# Patient Record
Sex: Female | Born: 1955 | Race: White | Hispanic: No | Marital: Married | Smoking: Never smoker
Health system: Southern US, Community
[De-identification: ages and names within clinical notes are randomized; demographics above are authoritative.]

## PROBLEM LIST (undated history)

## (undated) DIAGNOSIS — K219 Gastro-esophageal reflux disease without esophagitis: Secondary | ICD-10-CM

## (undated) DIAGNOSIS — Z9889 Other specified postprocedural states: Secondary | ICD-10-CM

## (undated) DIAGNOSIS — I10 Essential (primary) hypertension: Secondary | ICD-10-CM

## (undated) DIAGNOSIS — J45909 Unspecified asthma, uncomplicated: Secondary | ICD-10-CM

## (undated) DIAGNOSIS — R112 Nausea with vomiting, unspecified: Secondary | ICD-10-CM

## (undated) DIAGNOSIS — H269 Unspecified cataract: Secondary | ICD-10-CM

## (undated) DIAGNOSIS — T7840XA Allergy, unspecified, initial encounter: Secondary | ICD-10-CM

## (undated) DIAGNOSIS — G4730 Sleep apnea, unspecified: Secondary | ICD-10-CM

## (undated) DIAGNOSIS — F419 Anxiety disorder, unspecified: Secondary | ICD-10-CM

## (undated) DIAGNOSIS — E119 Type 2 diabetes mellitus without complications: Secondary | ICD-10-CM

## (undated) DIAGNOSIS — F32A Depression, unspecified: Secondary | ICD-10-CM

## (undated) HISTORY — DX: Allergy, unspecified, initial encounter: T78.40XA

## (undated) HISTORY — DX: Sleep apnea, unspecified: G47.30

## (undated) HISTORY — DX: Anxiety disorder, unspecified: F41.9

## (undated) HISTORY — DX: Depression, unspecified: F32.A

## (undated) HISTORY — DX: Unspecified cataract: H26.9

## (undated) HISTORY — DX: Unspecified asthma, uncomplicated: J45.909

## (undated) HISTORY — DX: Gastro-esophageal reflux disease without esophagitis: K21.9

## (undated) HISTORY — DX: Type 2 diabetes mellitus without complications: E11.9

## (undated) HISTORY — PX: JOINT REPLACEMENT: SHX530

## (undated) HISTORY — DX: Essential (primary) hypertension: I10

## (undated) HISTORY — PX: NO PAST SURGERIES: SHX2092

## (undated) HISTORY — PX: BREAST BIOPSY: SHX20

---

## 1983-11-05 HISTORY — PX: AUGMENTATION MAMMAPLASTY: SUR837

## 1998-02-27 ENCOUNTER — Other Ambulatory Visit: Admission: RE | Admit: 1998-02-27 | Discharge: 1998-02-27 | Payer: Self-pay | Admitting: Obstetrics & Gynecology

## 2000-09-06 ENCOUNTER — Encounter: Admission: RE | Admit: 2000-09-06 | Discharge: 2000-09-06 | Payer: Self-pay | Admitting: *Deleted

## 2000-12-29 ENCOUNTER — Other Ambulatory Visit: Admission: RE | Admit: 2000-12-29 | Discharge: 2000-12-29 | Payer: Self-pay | Admitting: Obstetrics & Gynecology

## 2003-03-14 ENCOUNTER — Ambulatory Visit (HOSPITAL_COMMUNITY): Admission: RE | Admit: 2003-03-14 | Discharge: 2003-03-14 | Payer: Self-pay | Admitting: *Deleted

## 2003-03-14 ENCOUNTER — Encounter: Payer: Self-pay | Admitting: *Deleted

## 2004-01-23 ENCOUNTER — Ambulatory Visit (HOSPITAL_COMMUNITY): Admission: RE | Admit: 2004-01-23 | Discharge: 2004-01-23 | Payer: Self-pay | Admitting: Family Medicine

## 2004-01-23 IMAGING — CT CT ABDOMEN W/ CM
1 of 2 series · 14 of 32 positions shown, 19 images · IV contrast (omnipaque)
Comparison: none

CLINICAL DATA: Abdominal pain.
TECHNIQUE: Multi detector helical CT imaging of the abdomen and pelvis performed following dilute oral contrast and 100 cc Omnipaque 300.  Non-ionic contrast indicated due to history of asthma.  No prior exams for comparison.
 CT ABDOMEN WITH CONTRAST
 Bilateral breast prostheses with capsular calcification.  Lung bases clear.  Small nonspecific low attenuation focus, liver, medially in the inferior aspect of the right lobe, 7 x 9 mm in size (image #39), too small to characterize.  This persists on delayed images.  Potentially, this could represent a small cyst.  Confirmation by ultrasound recommended.  Remainder of liver, spleen, pancreas, kidneys and adrenal glands unremarkable.  No mass, adenopathy, free fluid or inflammatory process seen.  Oral contrast has progressed to distal small bowel and colon.  
 IMPRESSION
 Tiny nonspecific low attenuation focus liver, 9 x 7 mm in size.  Potentially this could represent a small cyst and confirmation by ultrasound recommended.
 CT PELVIS WITH CONTRAST
 Tiny amount of free pelvic fluid in cul-de-sac.  Central low attenuation in uterus may be related to phase of menses.  Low attenuation focus right adnexa, probably representing an ovarian cyst, 2.8 x 3.2 cm in size (image #87).  This, too, can be confirmed by ultrasound.  Normal appendix.  No additional mass, adenopathy or hernia.  Bowel loops otherwise unremarkable.  
 Tiny amount of free pelvic fluid.  Potential right ovarian cyst, recommend confirmation by ultrasound.

[Series 8542: — · axial · 0.77mm/px · z∈[+1084,+1550]mm · 14 of 103 slices shown, 19 images]
[im 5/103  soft-tissue]
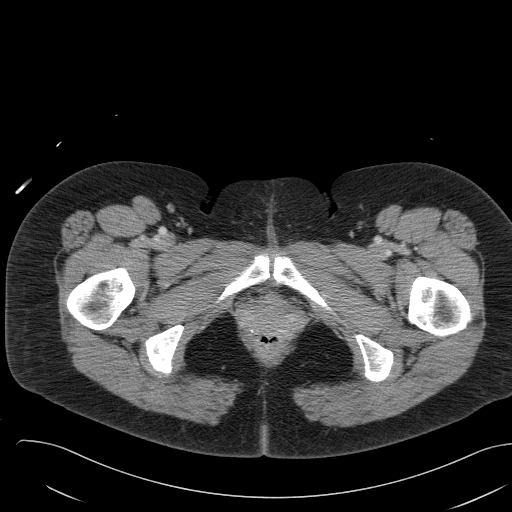
[im 5/103  bone]
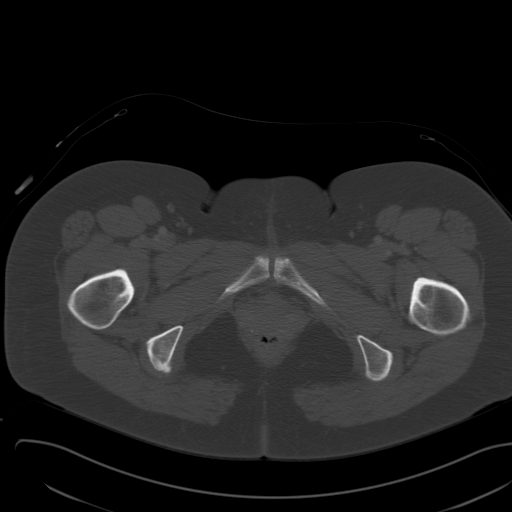
[im 15/103  soft-tissue]
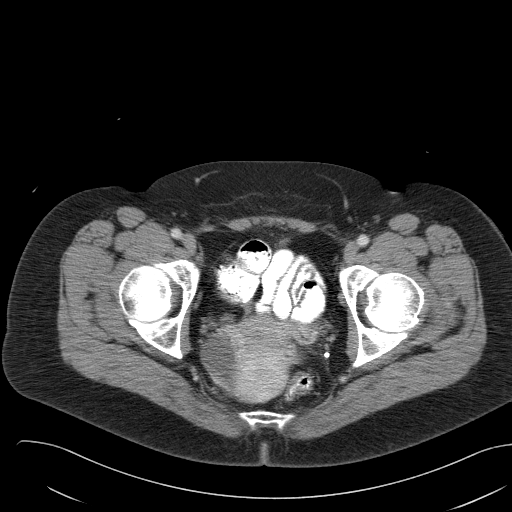
[im 20/103  soft-tissue]
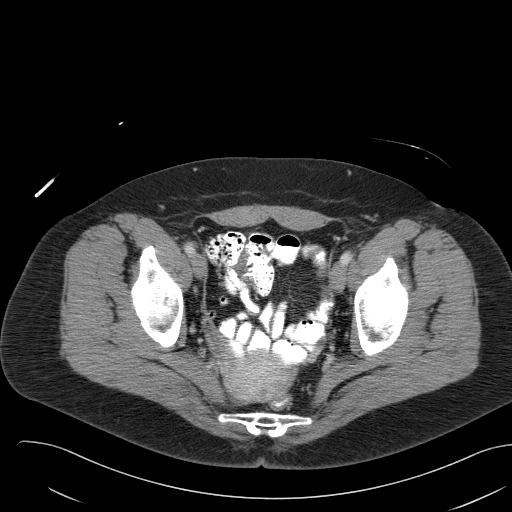
[im 30/103  soft-tissue]
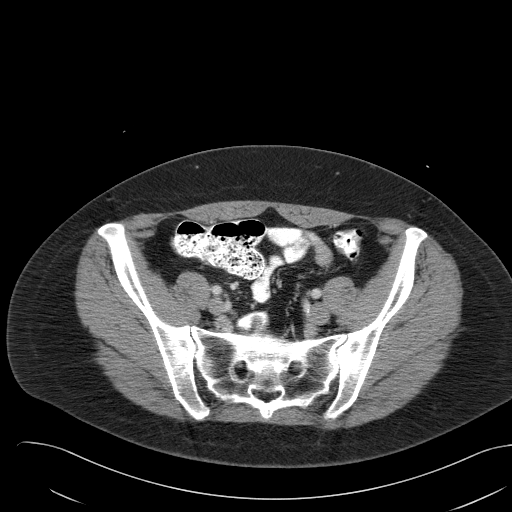
[im 35/103  soft-tissue]
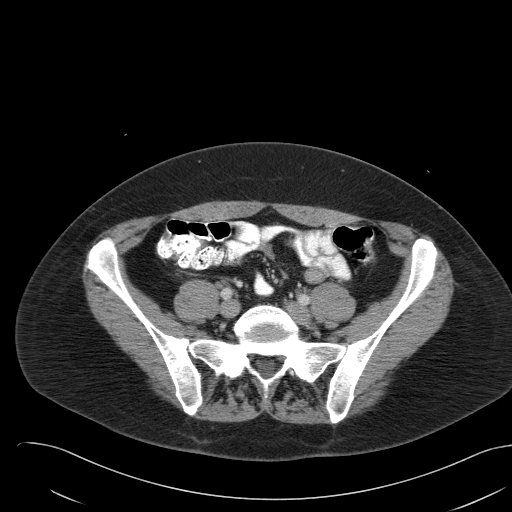
[im 44/103  soft-tissue]
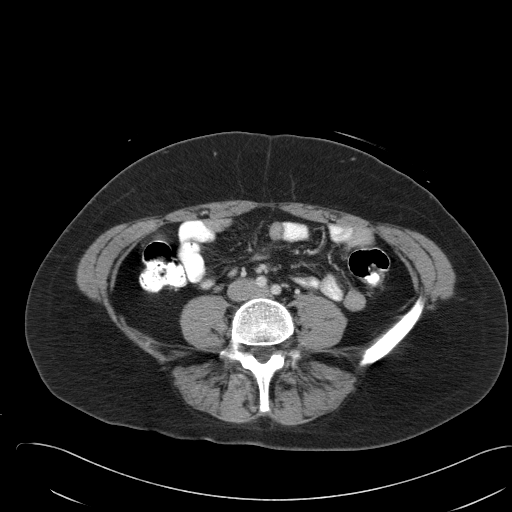
[im 54/103  soft-tissue]
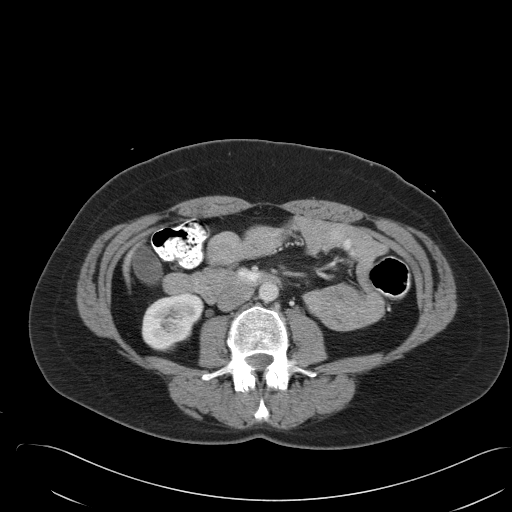
[im 59/103  soft-tissue]
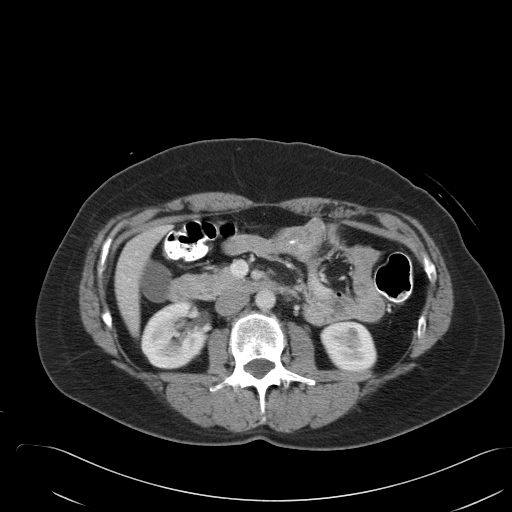
[im 69/103  soft-tissue]
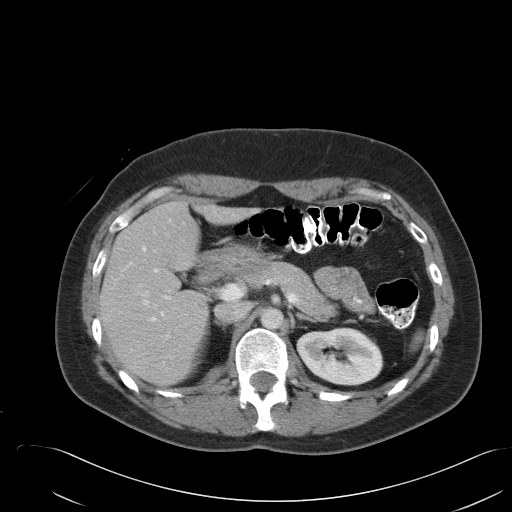
[im 69/103  bone]
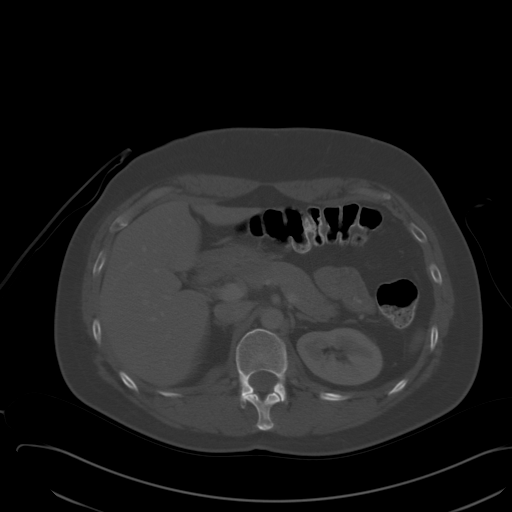
[im 73/103  soft-tissue]
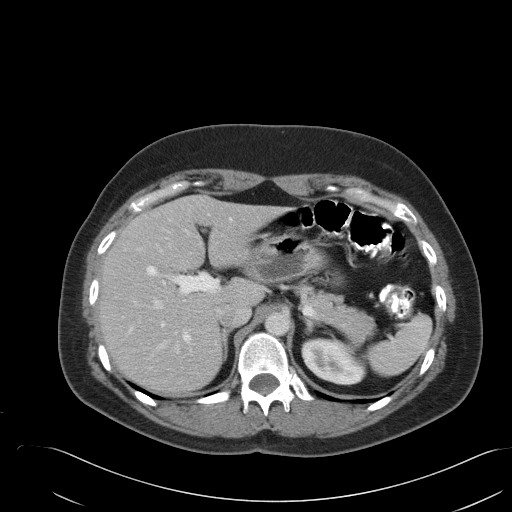
[im 83/103  soft-tissue]
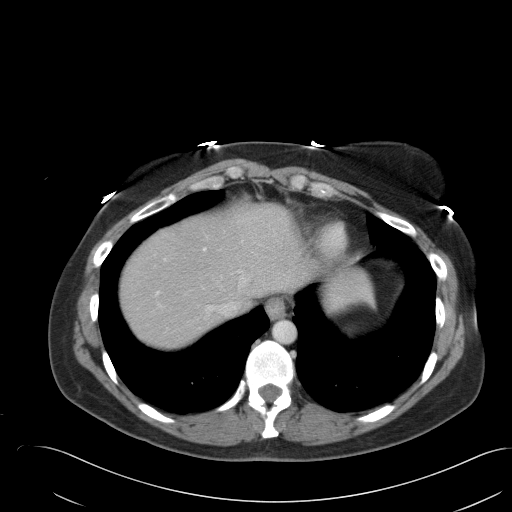
[im 83/103  lung]
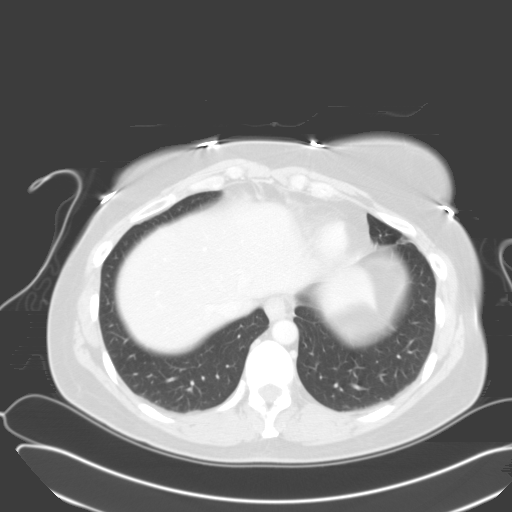
[im 88/103  soft-tissue]
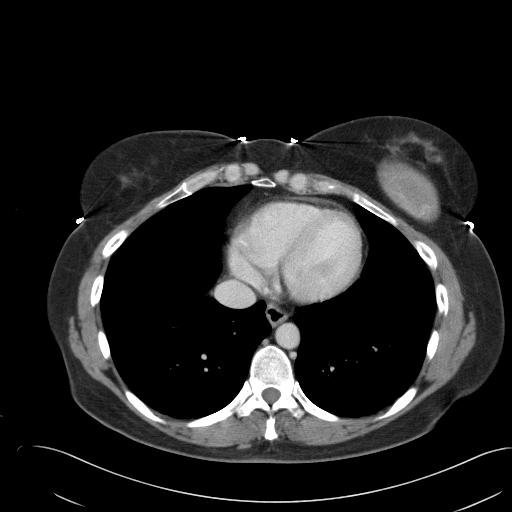
[im 88/103  lung]
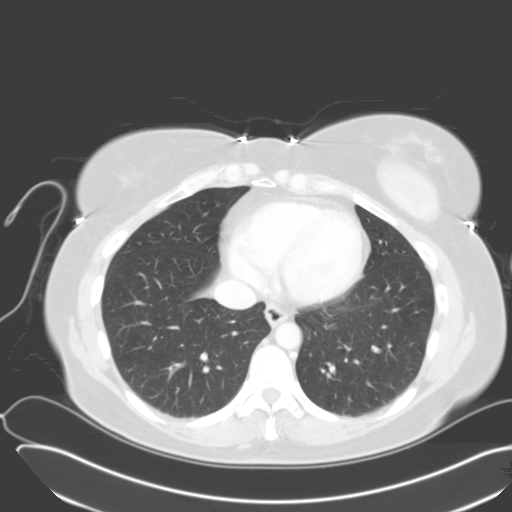
[im 93/103  lung]
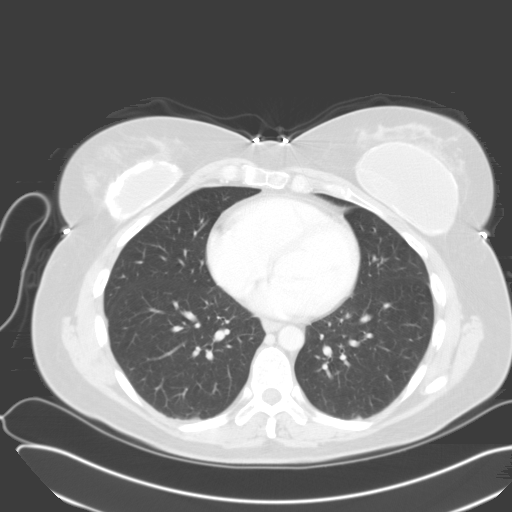
[im 98/103  soft-tissue]
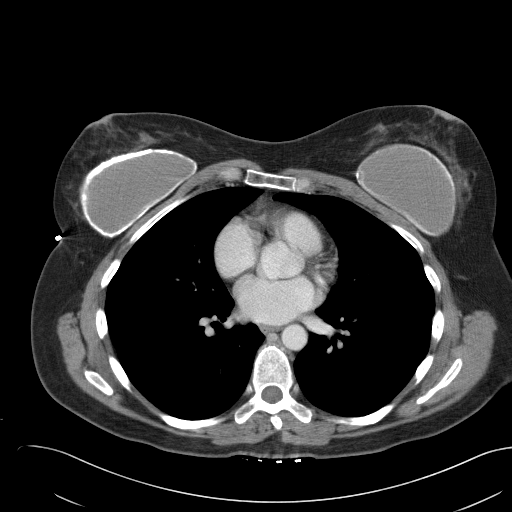
[im 98/103  lung]
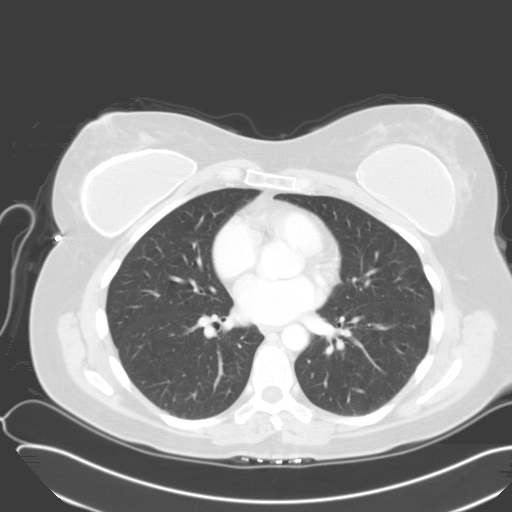

[14 of 32 positions shown; findings below may reference images not displayed]

## 2004-02-22 ENCOUNTER — Ambulatory Visit (HOSPITAL_COMMUNITY): Admission: RE | Admit: 2004-02-22 | Discharge: 2004-02-22 | Payer: Self-pay | Admitting: Family Medicine

## 2004-02-22 IMAGING — CR DG FOOT COMPLETE 3+V*L*
2 series · 2 of 2 positions shown · non-contrast
Comparison: none

CLINICAL DATA: Left ankle and foot pain.
COMPLETE LEFT FOOT
Three views of the left foot demonstrate normal appearing bones and soft tissues. 
IMPRESSION
Normal examination. 
COMPLETE LEFT ANKLE
Three views of the left ankle demonstrate normal appearing bones and soft tissues. 
Normal examination.

[view not recorded (1 of 2)]
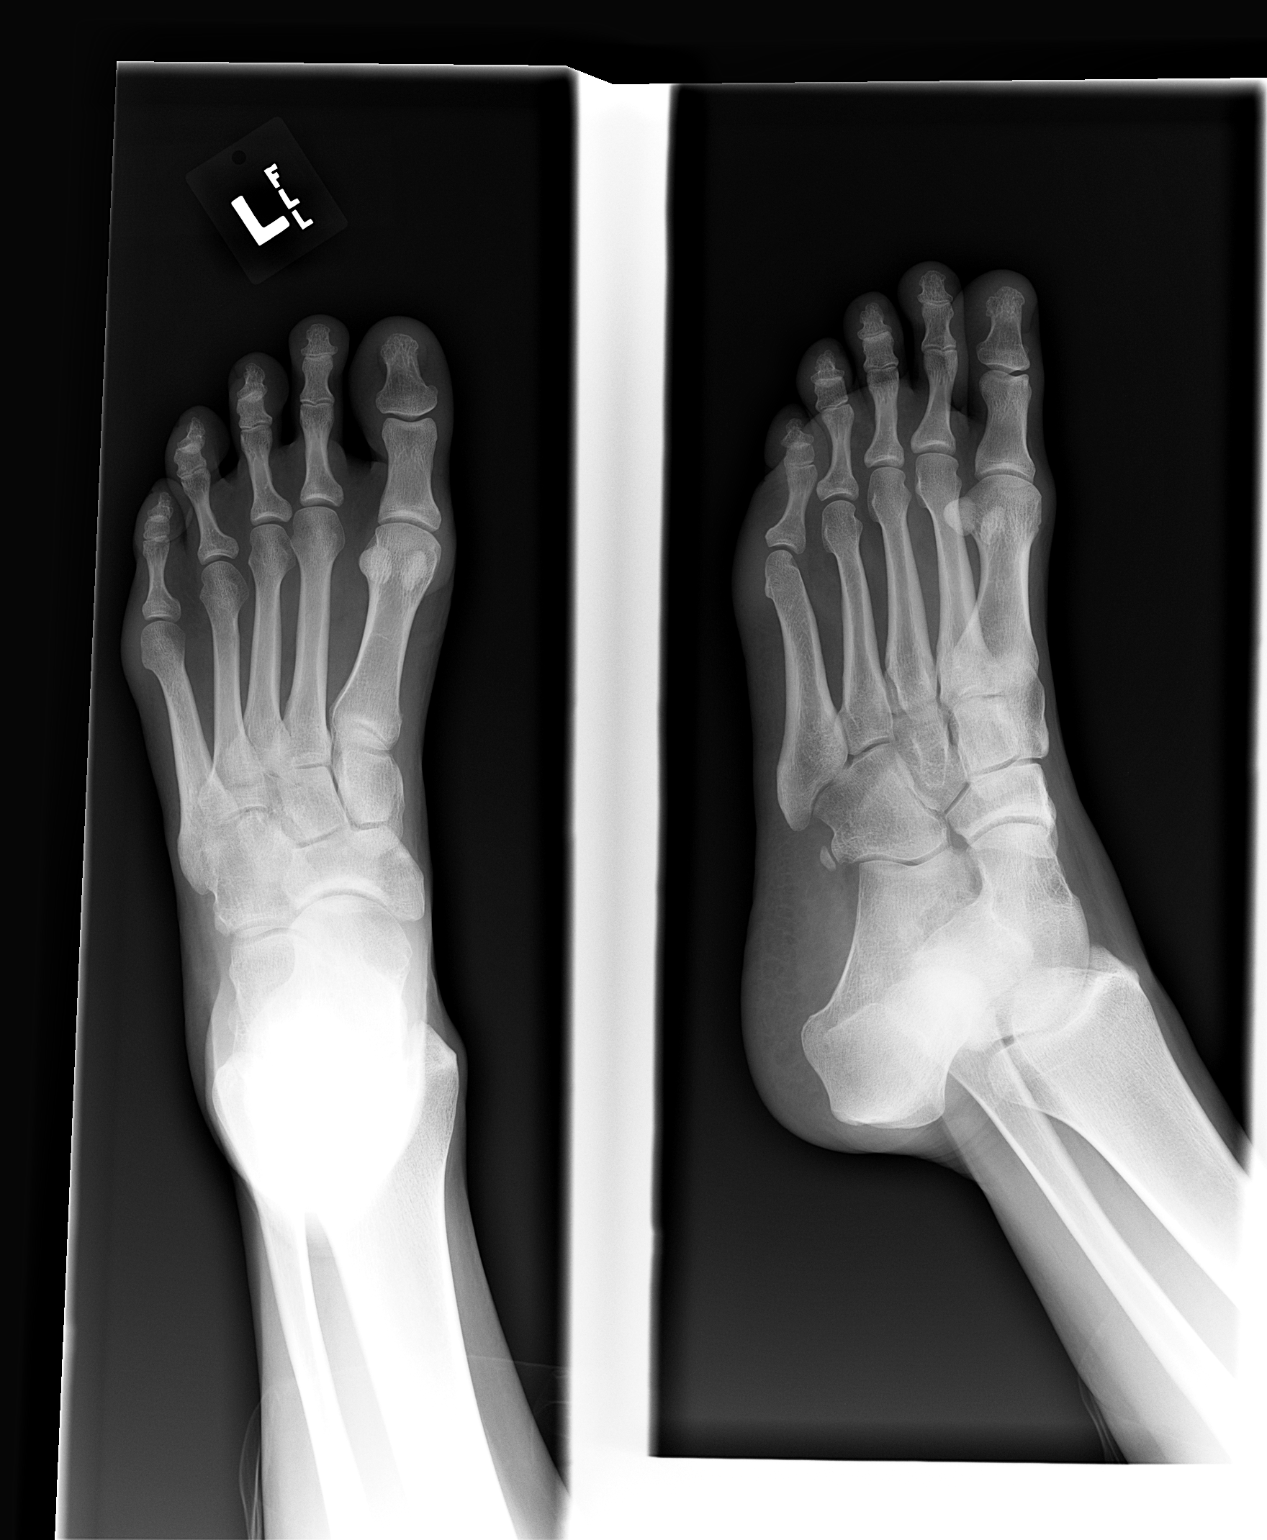

[view not recorded (2 of 2)]
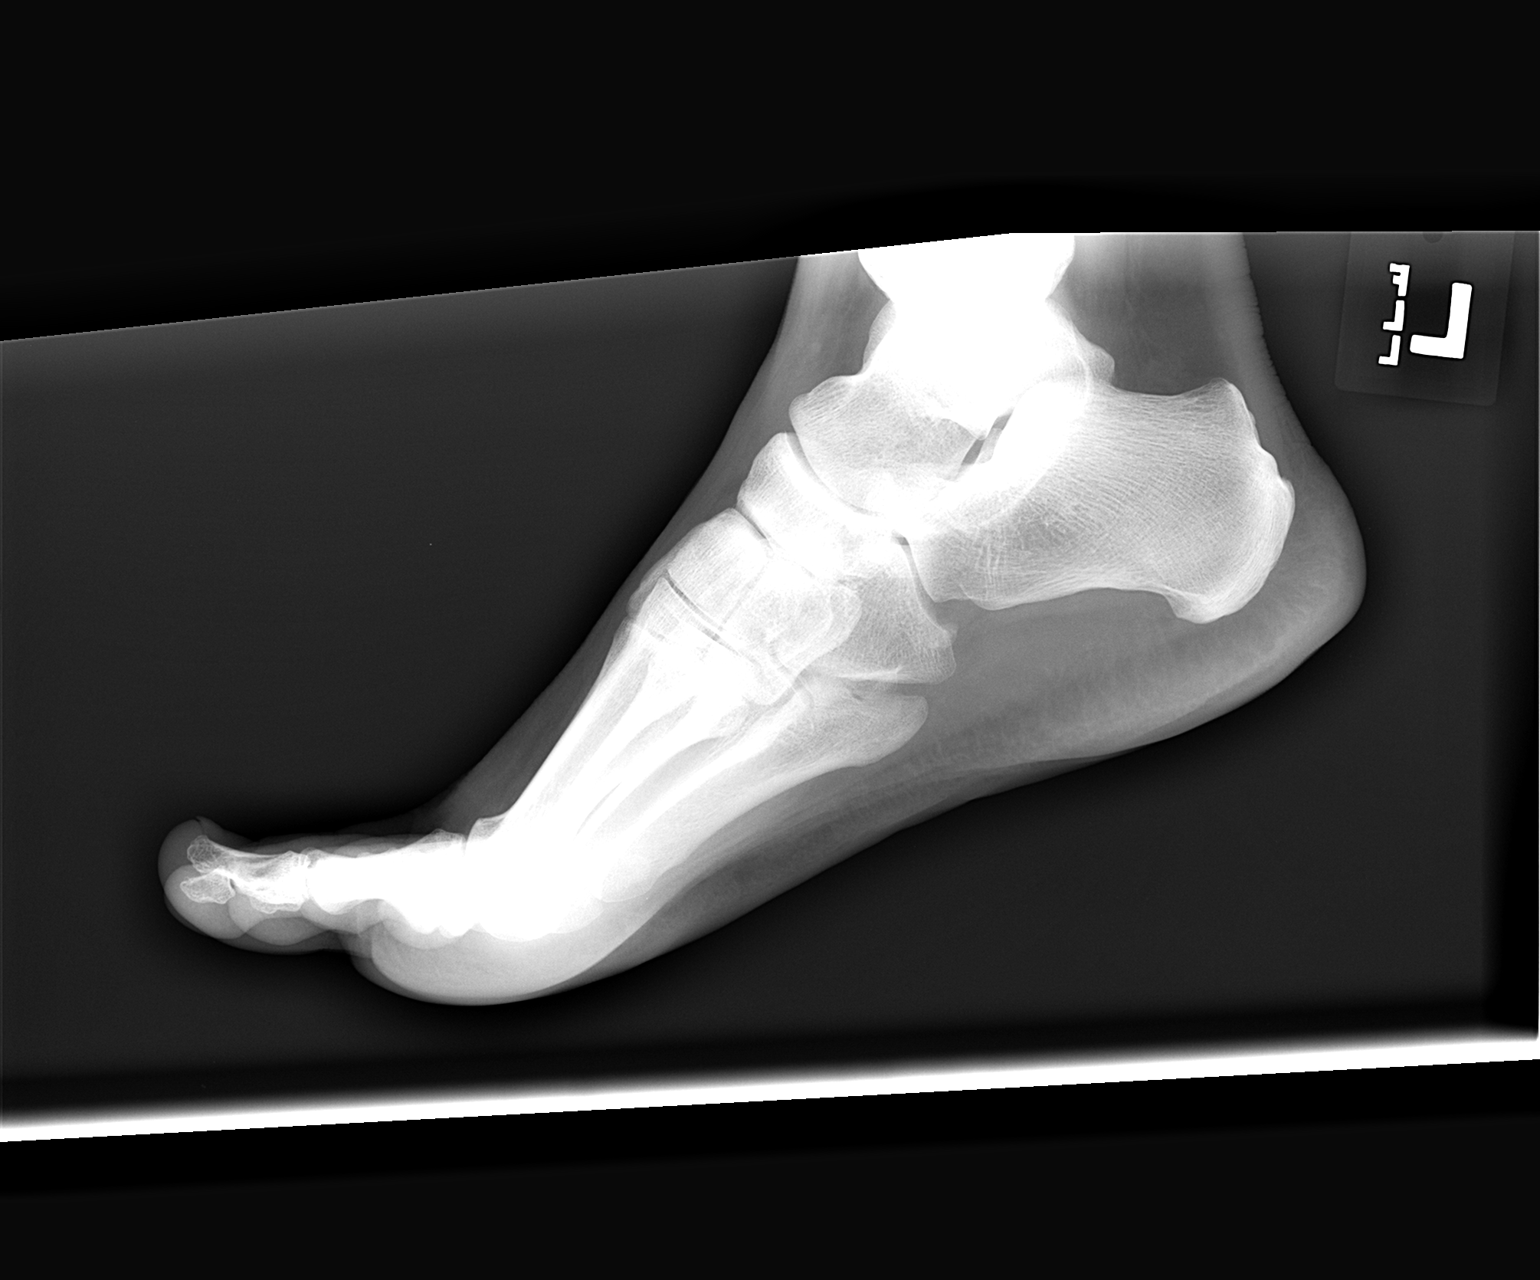

[2 of 2 positions shown; findings below may reference images not displayed]

## 2004-02-22 IMAGING — US US ABDOMEN LIMITED
1 series · 14 of 25 positions shown · non-contrast
Comparison: none

CLINICAL DATA: The patient had an abnormal CT scan with a lesion in the liver.  
 LIMITED ABDOMINAL ULTRASOUND 
 Multiple scans of the liver are made and show a small 6 x 6 x 9-mm cyst associated with the right lobe of the liver.

[Series 1: unknown · 0.34mm/px · 14 of 28 slices shown]
[im 1/28]
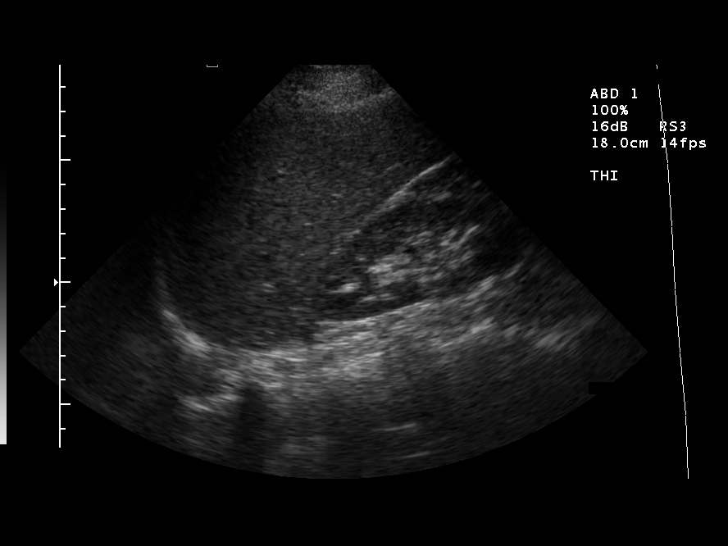
[im 3/28]
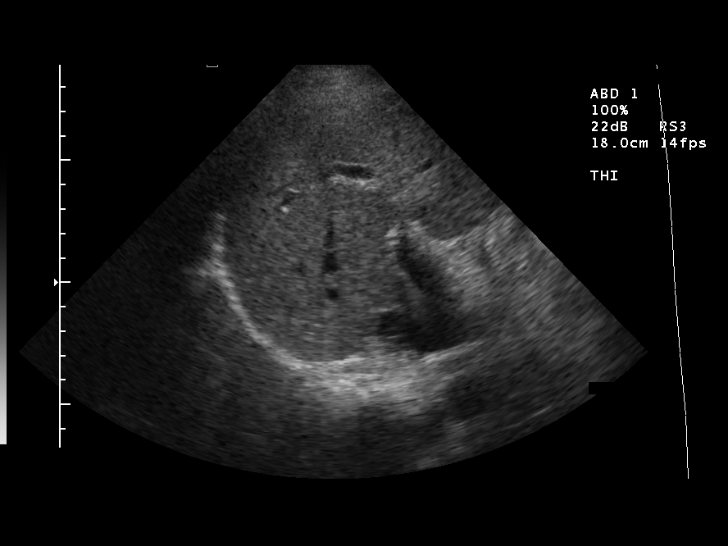
[im 5/28]
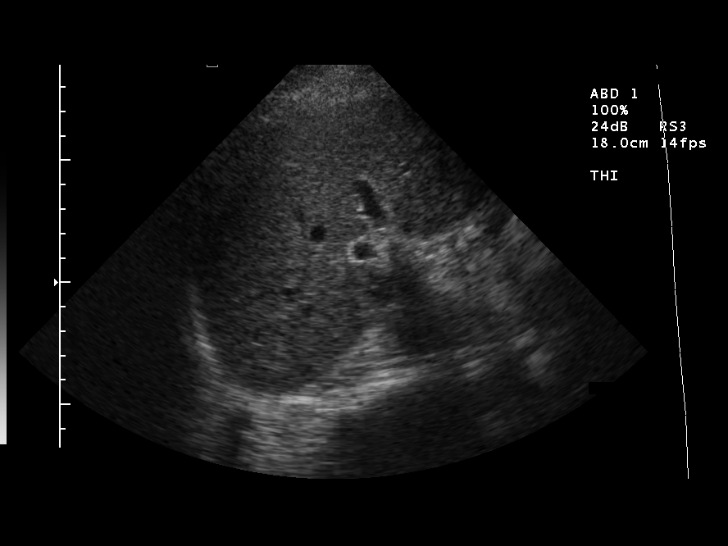
[im 7/28]
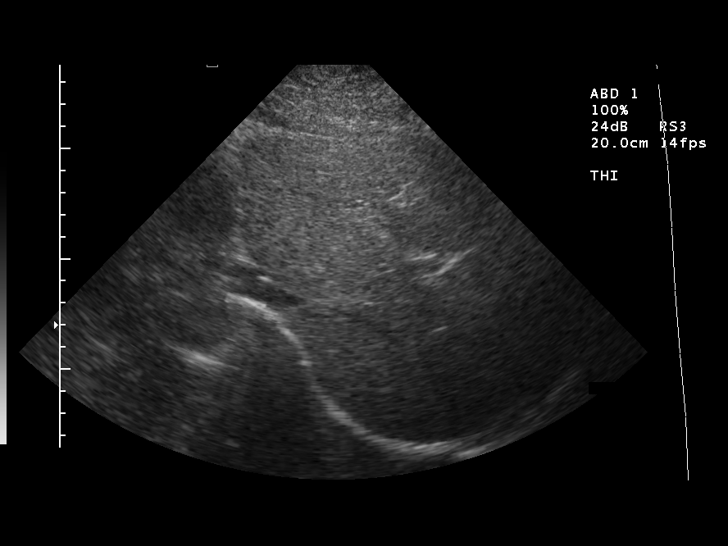
[im 10/28]
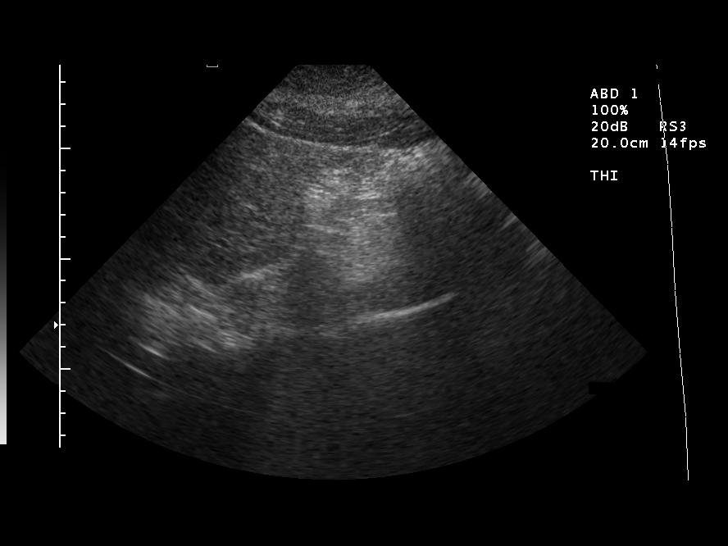
[im 11/28]
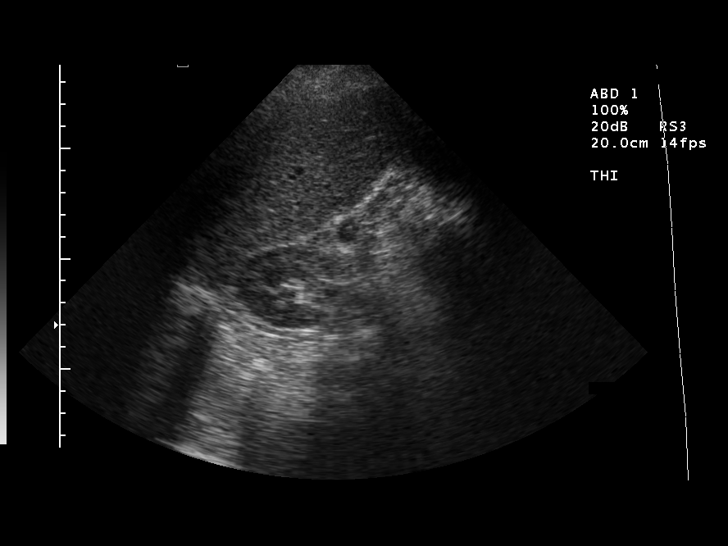
[im 13/28]
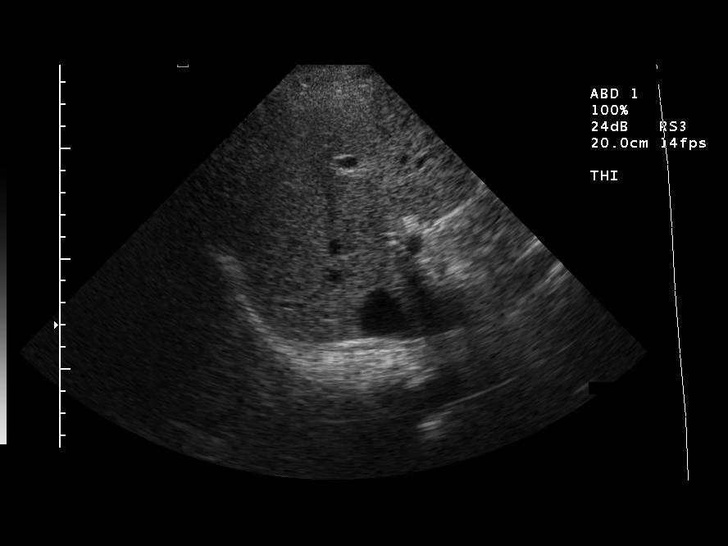
[im 15/28]
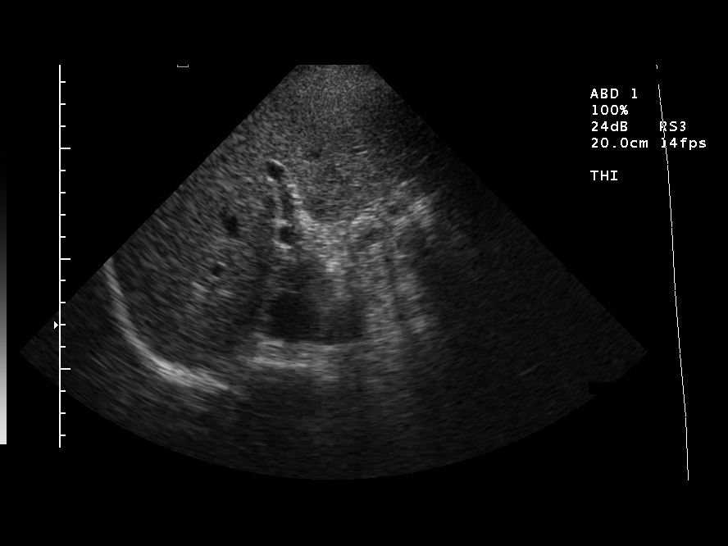
[im 17/28]
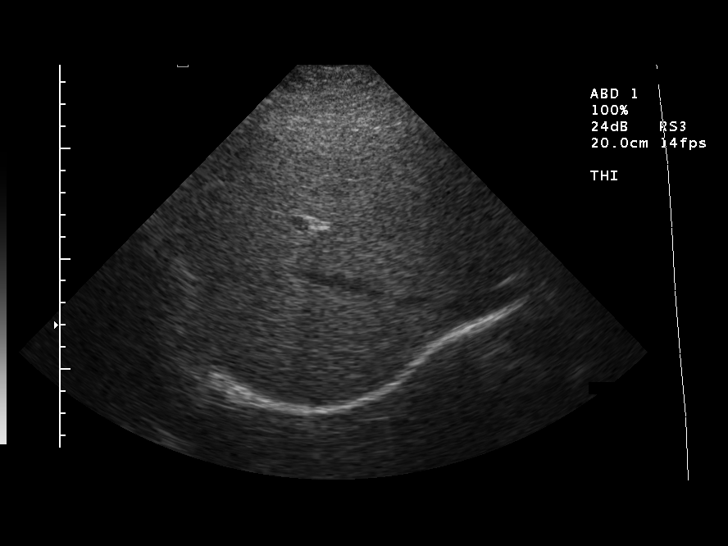
[im 19/28]
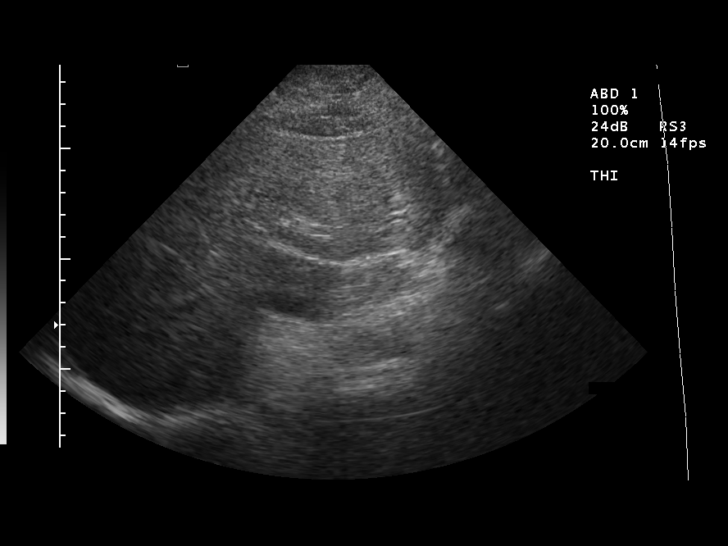
[im 21/28]
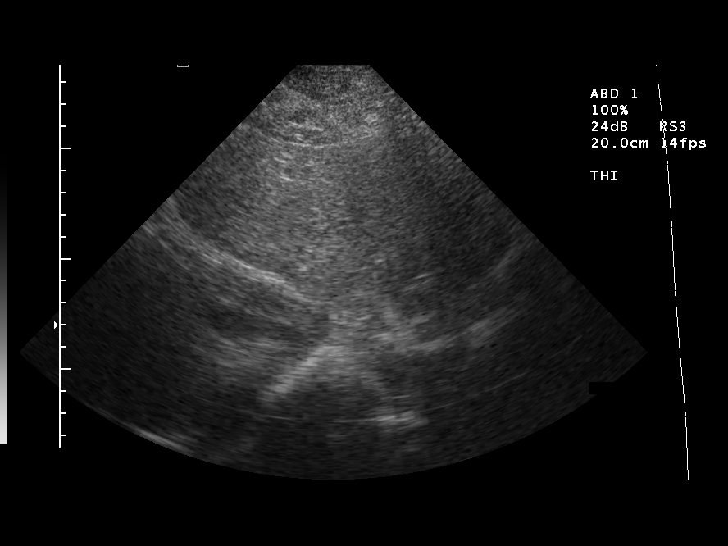
[im 23/28]
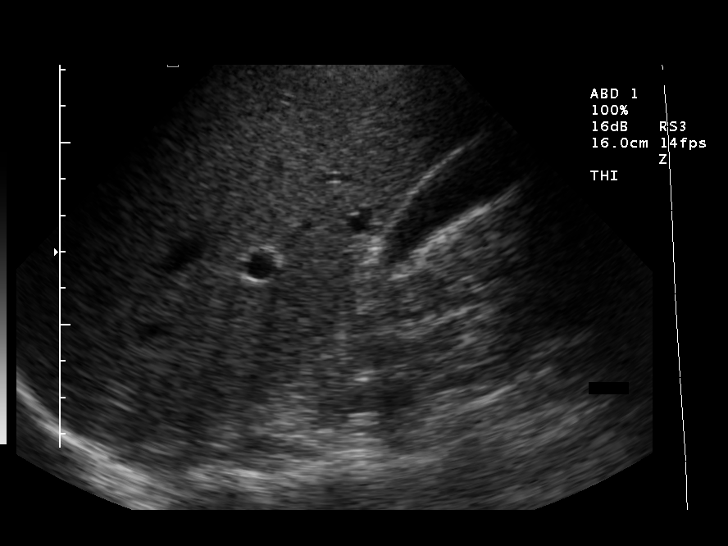
[im 25/28]
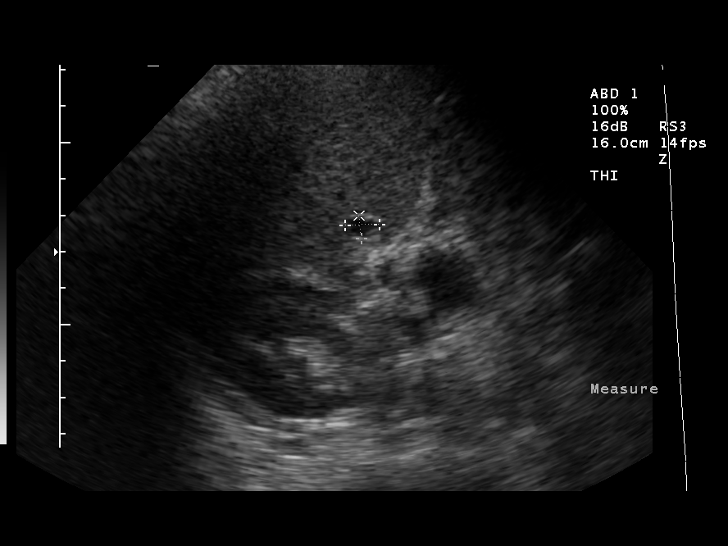
[im 28/28]
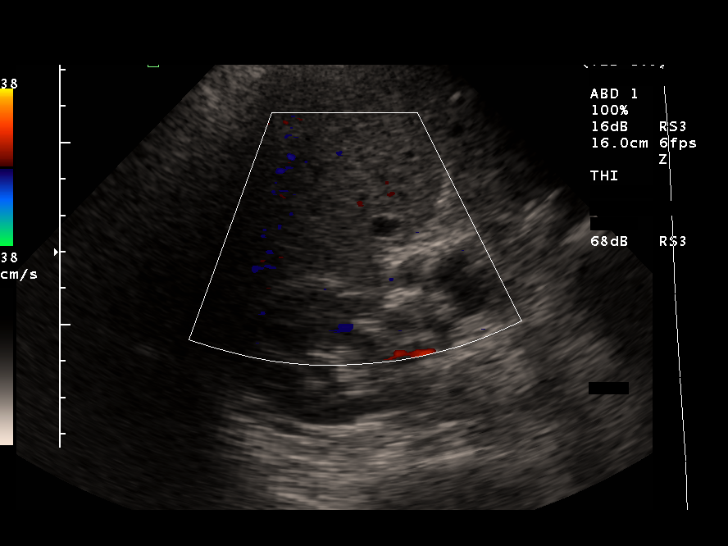

[14 of 25 positions shown; findings below may reference images not displayed]

IMPRESSION: Simple cyst, right lobe of the liver.
 CT SCAN OF THE PELVIS
 CT scan of the pelvis utilizing both transvaginal and routine techniques show the uterus to measure 4.2 x 4.0 x 8.1 cm.  The endometrium is within the normal limit and measures 4 mm in maximum diameter.    There are noted simple cysts in the region of the uterine cervix, consistent with nabothian cysts.  
 The right ovary measures 1.2 x 2.3 x 1.6 cm and shows a simple cyst which measures 6 x 7 x 9 mm.  The left ovary measures 0.9 x 1.6 x 1.1 cm and shows no cystic or solid mass.
 No significant free fluid is seen within the pelvis.
IMPRESSION: Multiple nabothian cysts associated with the cervical area.  Simple cyst, right ovary.  No definite free fluid.  Normal left ovary.

## 2004-02-22 IMAGING — CR DG ANKLE COMPLETE 3+V*L*
2 series · 2 of 2 positions shown · non-contrast
Comparison: none

CLINICAL DATA: Left ankle and foot pain.
COMPLETE LEFT FOOT
Three views of the left foot demonstrate normal appearing bones and soft tissues. 
IMPRESSION
Normal examination. 
COMPLETE LEFT ANKLE
Three views of the left ankle demonstrate normal appearing bones and soft tissues. 
Normal examination.

[view not recorded (1 of 2)]
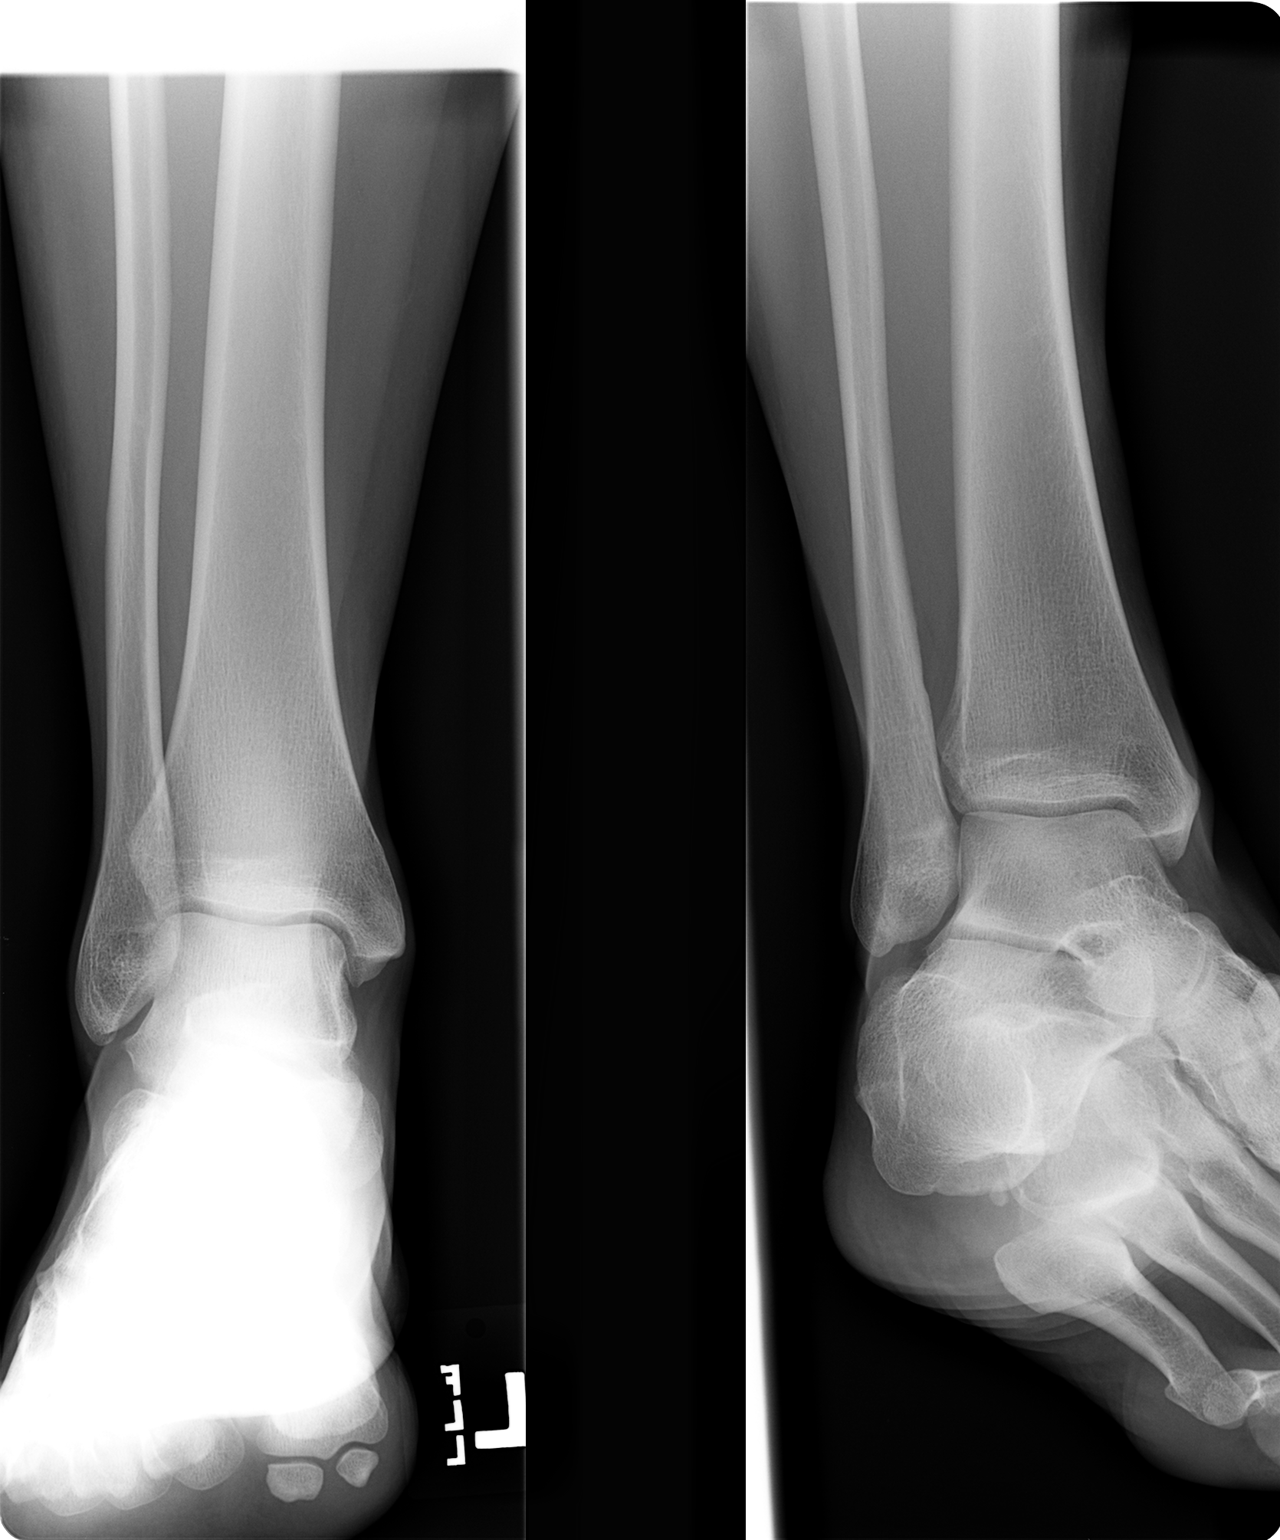

[view not recorded (2 of 2)]
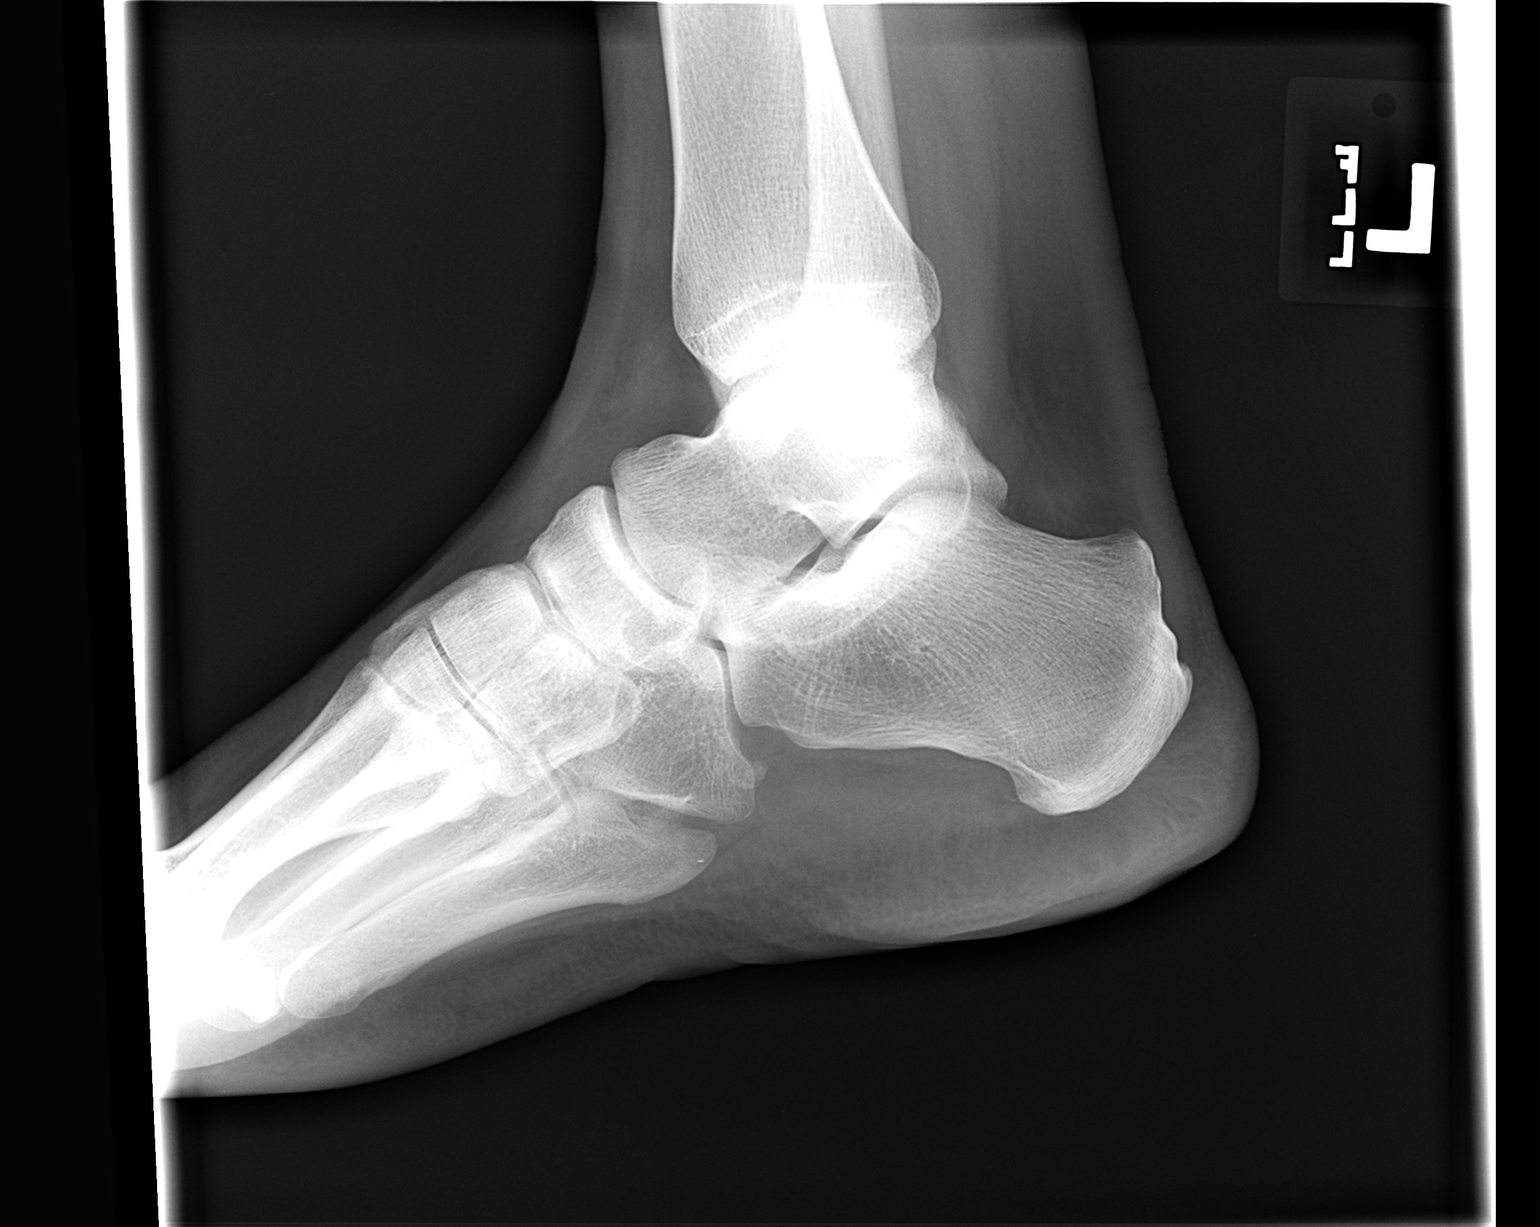

[2 of 2 positions shown; findings below may reference images not displayed]

## 2004-02-22 IMAGING — US US PELVIS COMPLETE MODIFY
1 series · 14 of 25 positions shown · non-contrast
Comparison: none

CLINICAL DATA: The patient had an abnormal CT scan with a lesion in the liver.  
 LIMITED ABDOMINAL ULTRASOUND 
 Multiple scans of the liver are made and show a small 6 x 6 x 9-mm cyst associated with the right lobe of the liver.

[Series 1: unknown · 0.33mm/px · 14 of 54 slices shown]
[im 1/54]
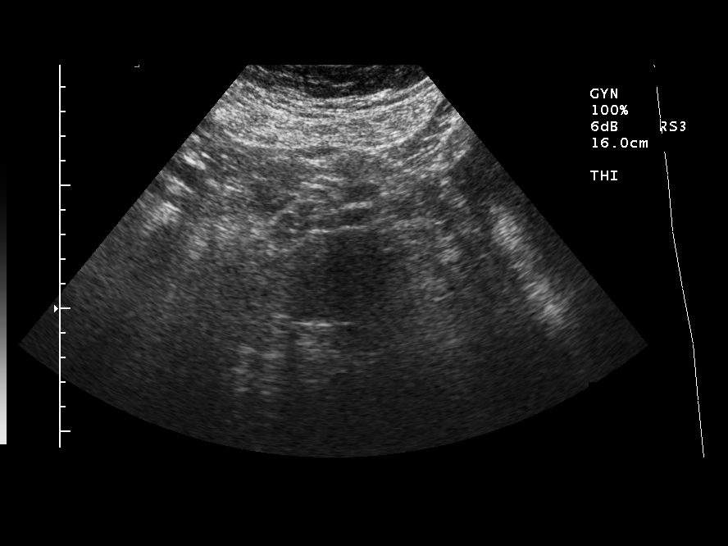
[im 5/54]
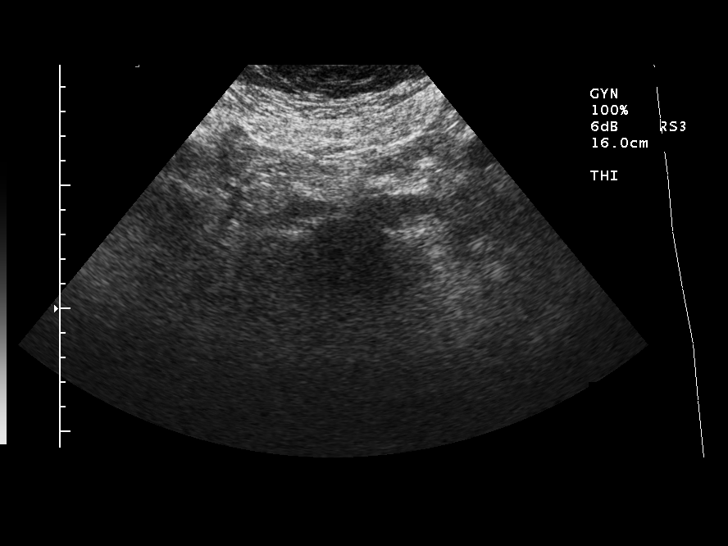
[im 9/54]
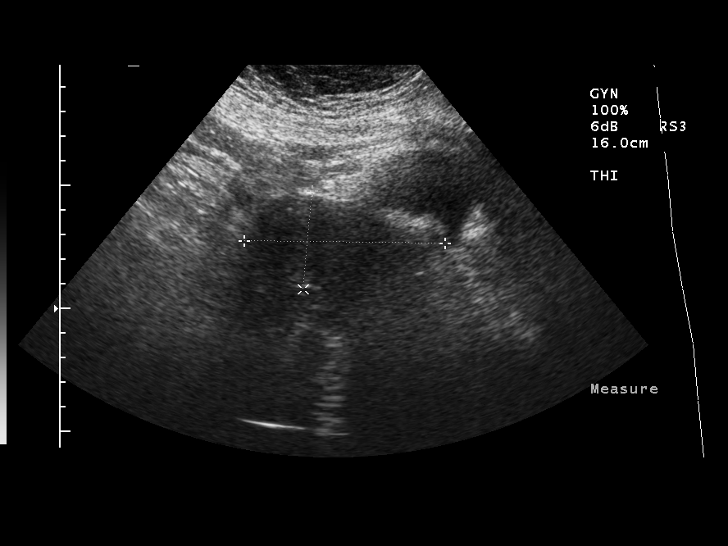
[im 14/54]
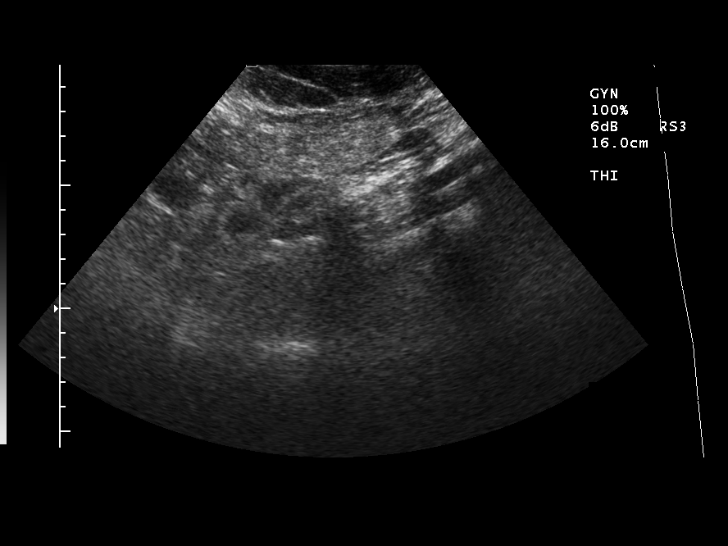
[im 18/54]
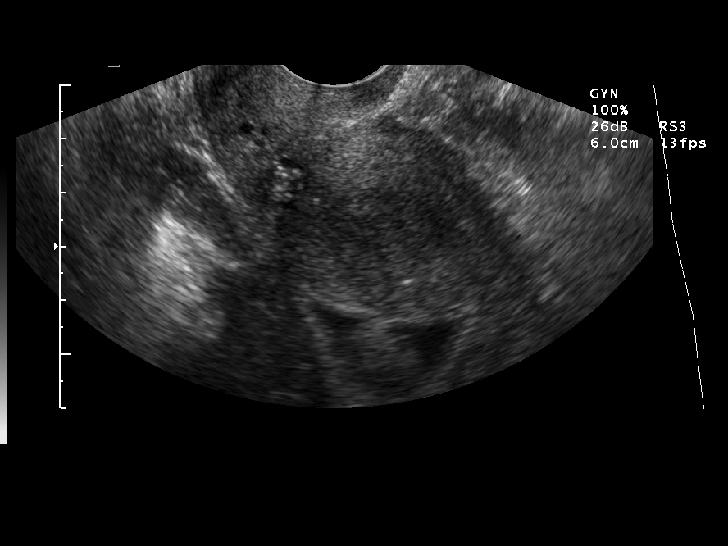
[im 20/54]
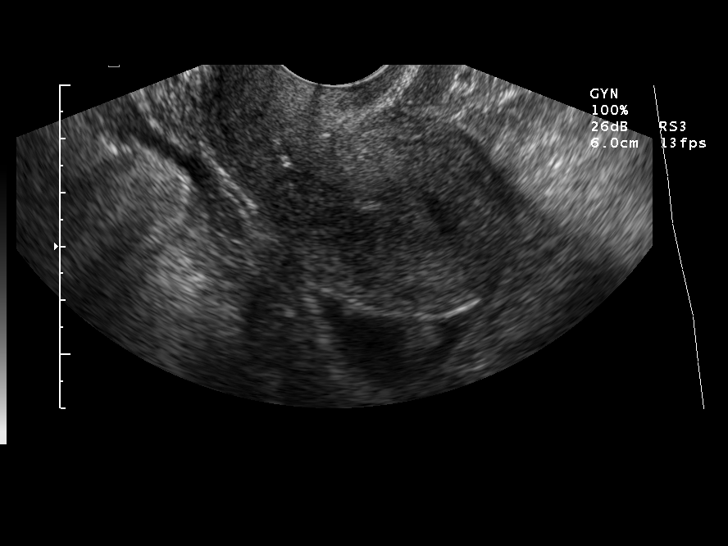
[im 25/54]
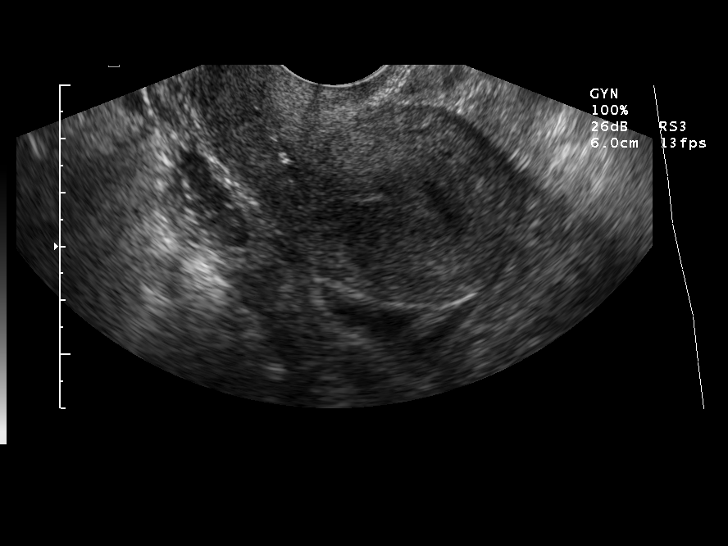
[im 29/54]
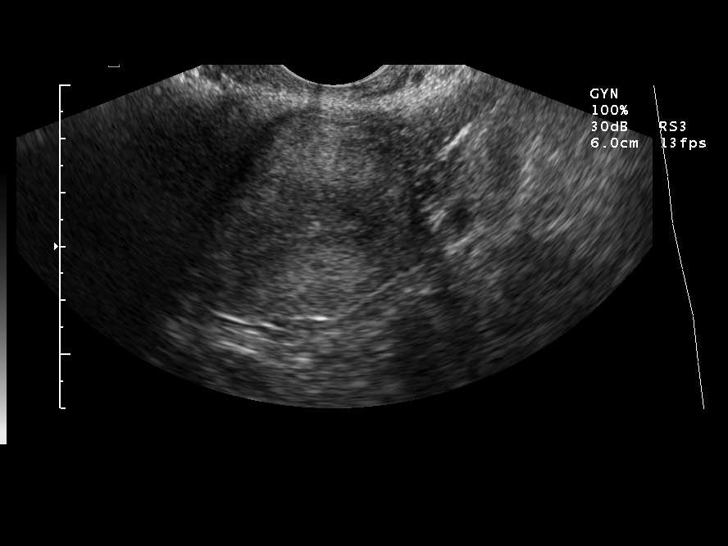
[im 34/54]
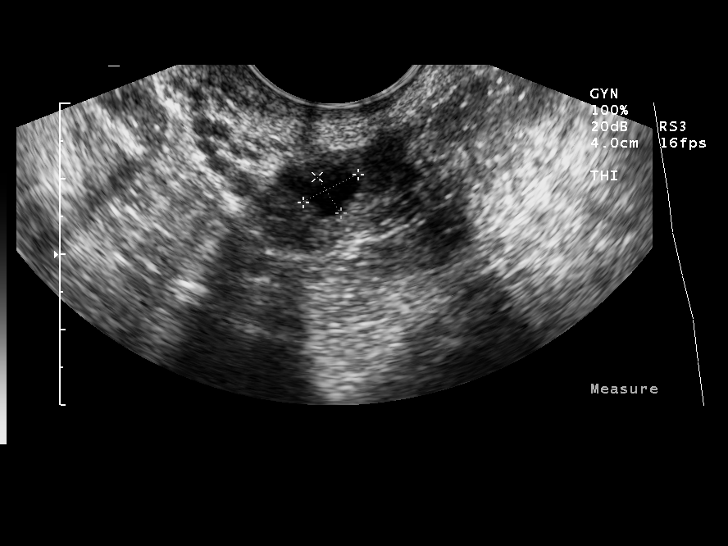
[im 36/54]
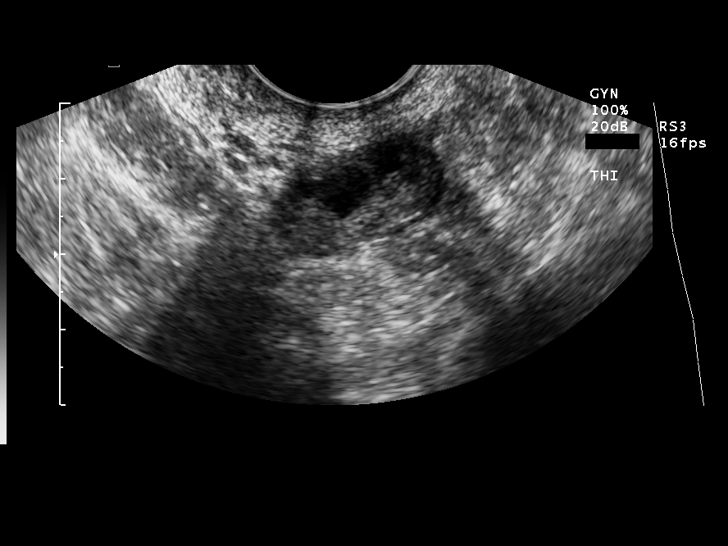
[im 40/54]
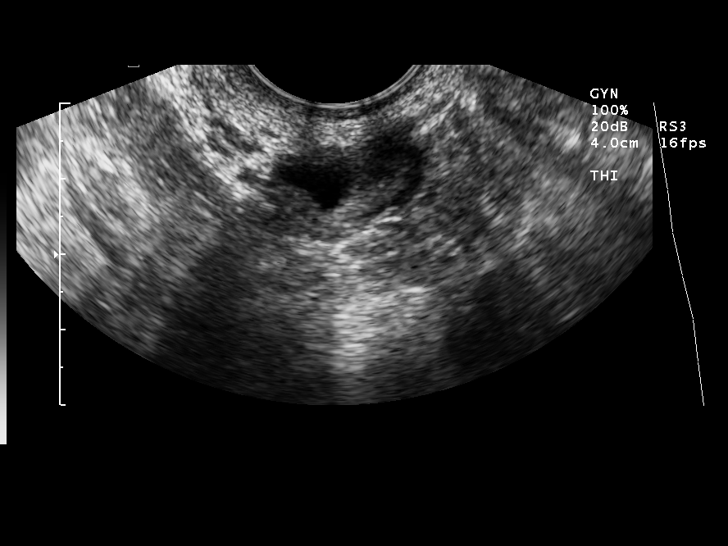
[im 45/54]
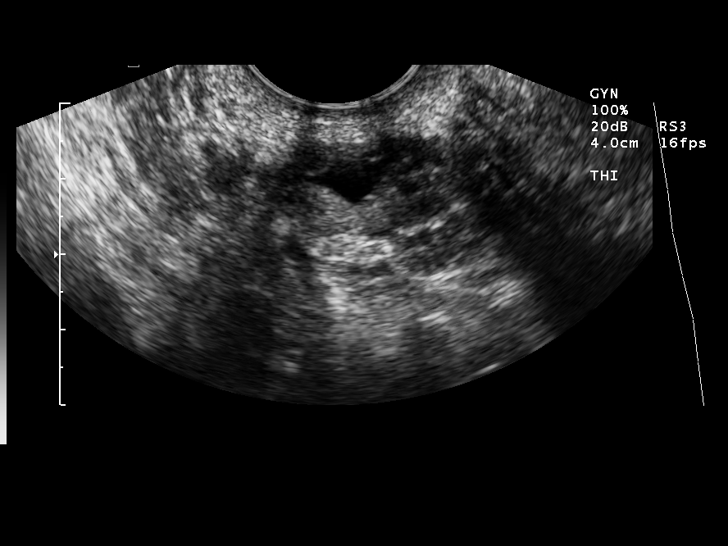
[im 49/54]
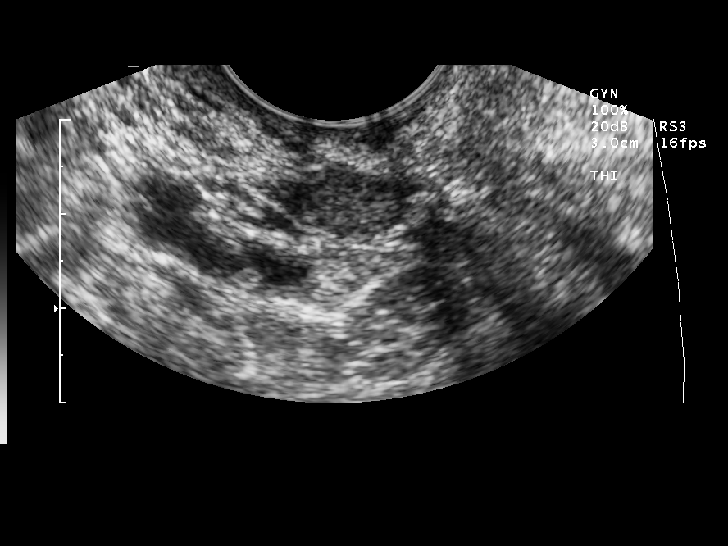
[im 54/54]
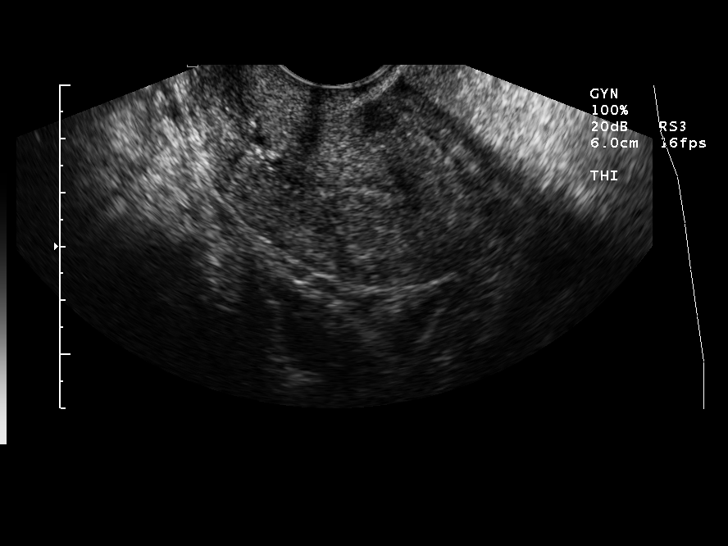

[14 of 25 positions shown; findings below may reference images not displayed]

IMPRESSION: Simple cyst, right lobe of the liver.
 CT SCAN OF THE PELVIS
 CT scan of the pelvis utilizing both transvaginal and routine techniques show the uterus to measure 4.2 x 4.0 x 8.1 cm.  The endometrium is within the normal limit and measures 4 mm in maximum diameter.    There are noted simple cysts in the region of the uterine cervix, consistent with nabothian cysts.  
 The right ovary measures 1.2 x 2.3 x 1.6 cm and shows a simple cyst which measures 6 x 7 x 9 mm.  The left ovary measures 0.9 x 1.6 x 1.1 cm and shows no cystic or solid mass.
 No significant free fluid is seen within the pelvis.
IMPRESSION: Multiple nabothian cysts associated with the cervical area.  Simple cyst, right ovary.  No definite free fluid.  Normal left ovary.

## 2004-06-11 ENCOUNTER — Ambulatory Visit (HOSPITAL_COMMUNITY): Admission: RE | Admit: 2004-06-11 | Discharge: 2004-06-11 | Payer: Self-pay | Admitting: Internal Medicine

## 2004-11-07 ENCOUNTER — Emergency Department (HOSPITAL_COMMUNITY): Admission: EM | Admit: 2004-11-07 | Discharge: 2004-11-07 | Payer: Self-pay | Admitting: Emergency Medicine

## 2004-11-07 IMAGING — CR DG ANKLE COMPLETE 3+V*L*
3 series · 3 of 3 positions shown · non-contrast
Comparison: none

CLINICAL DATA: Ankle swelling status post fall. 
 DIAGNOSTIC LEFT ANKLE, THREE VIEWS:
 Comparison [DATE].  There is lateral soft tissue swelling but no evidence of acute fracture or dislocation. Joint spaces are preserved.

[view not recorded (1 of 3)]
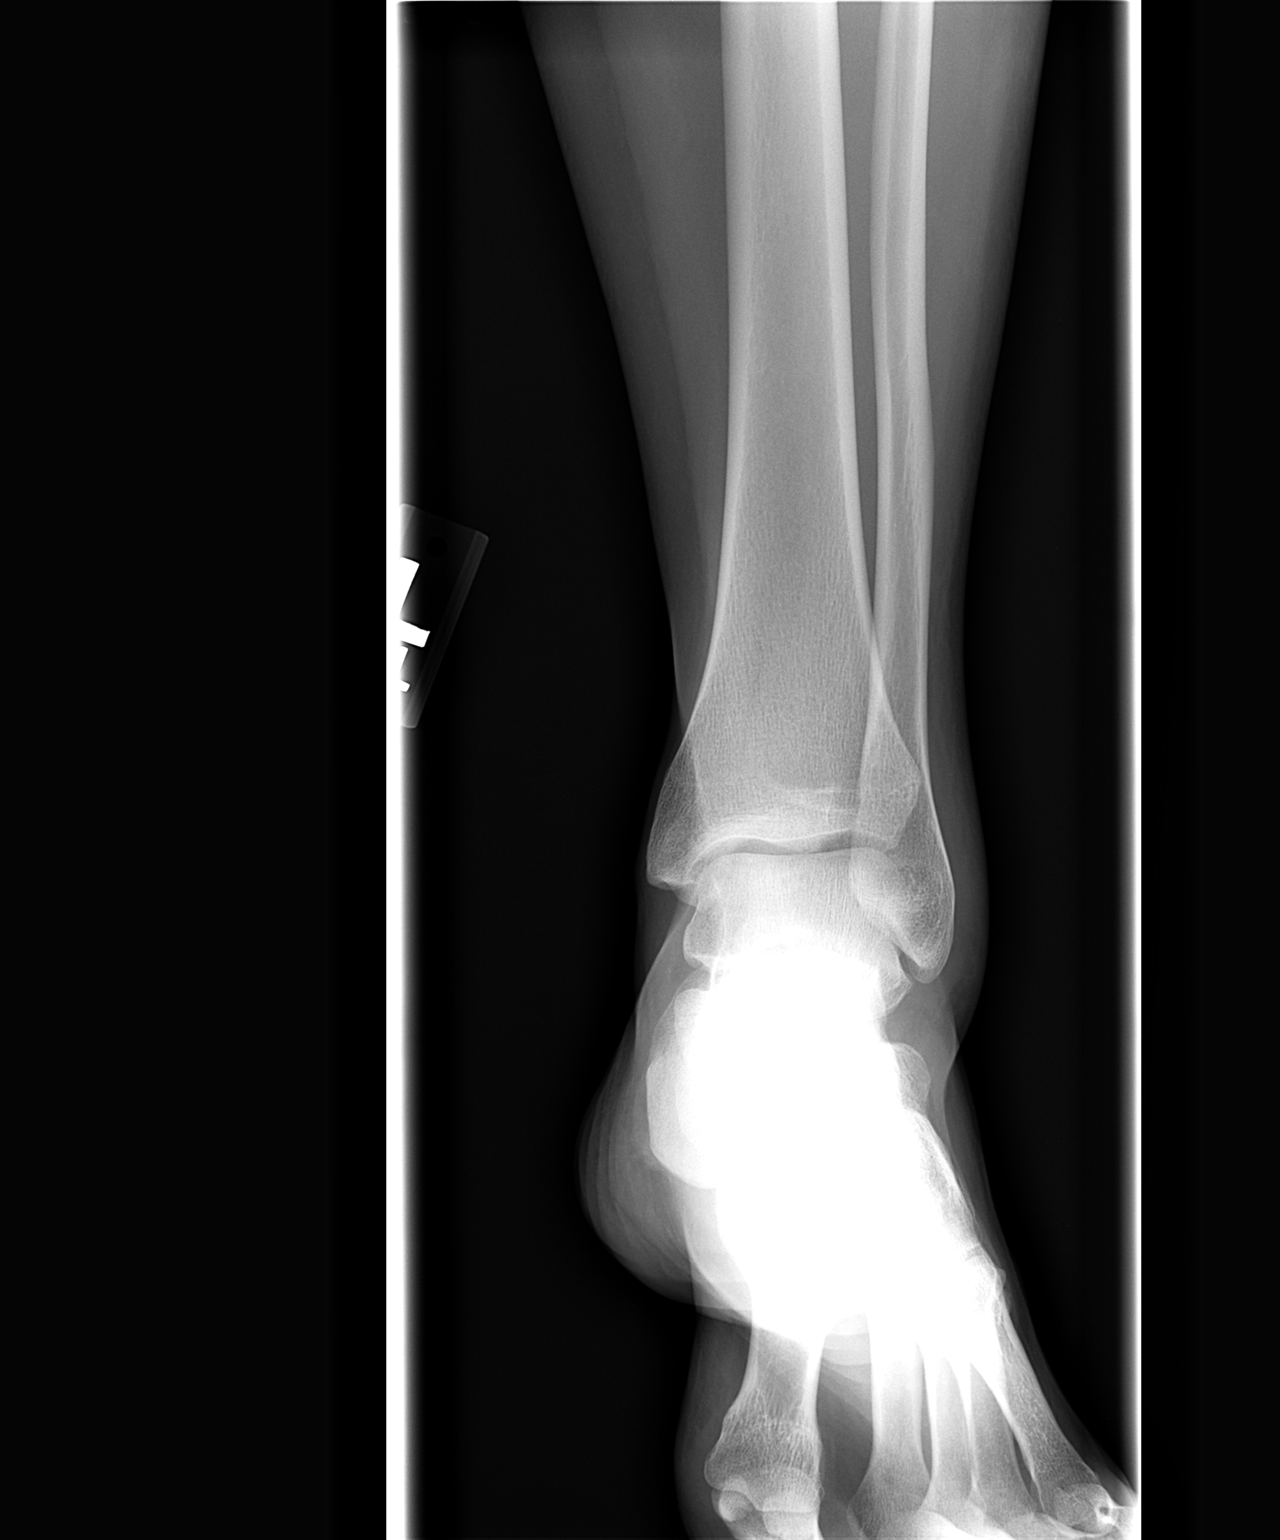

[view not recorded (2 of 3)]
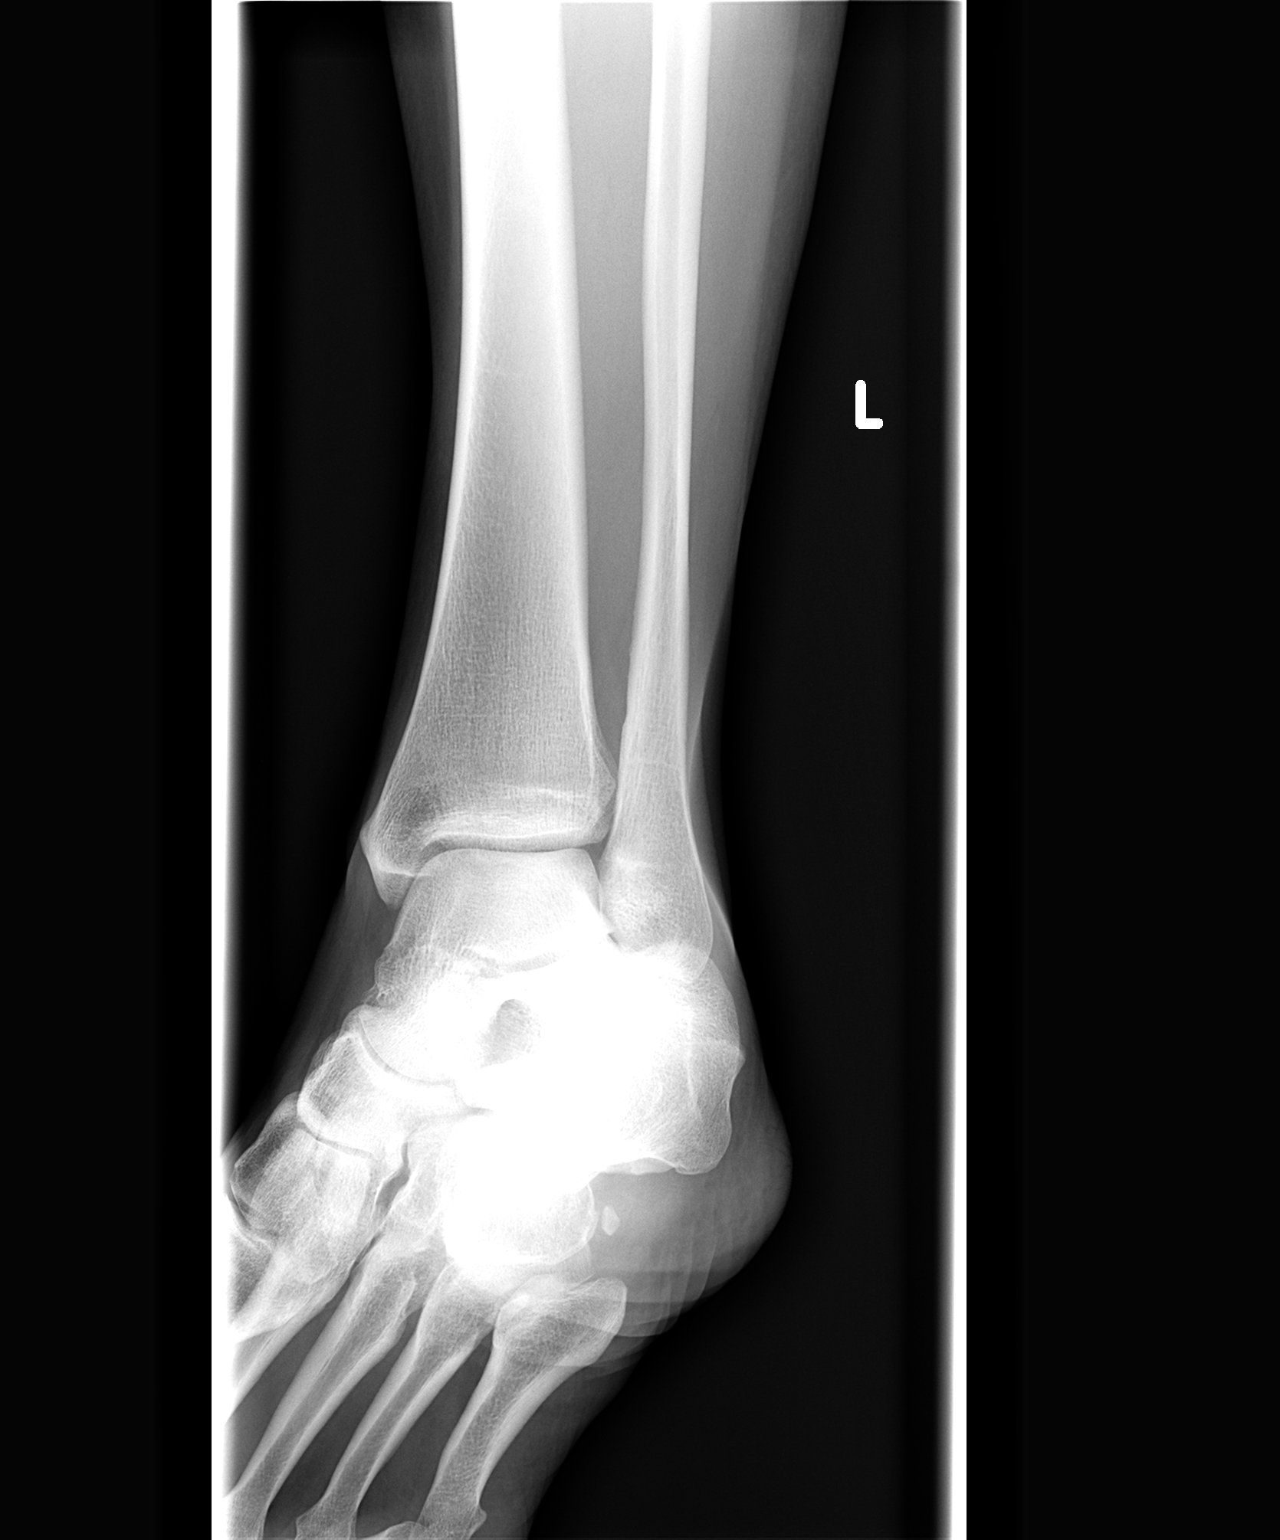

[view not recorded (3 of 3)]
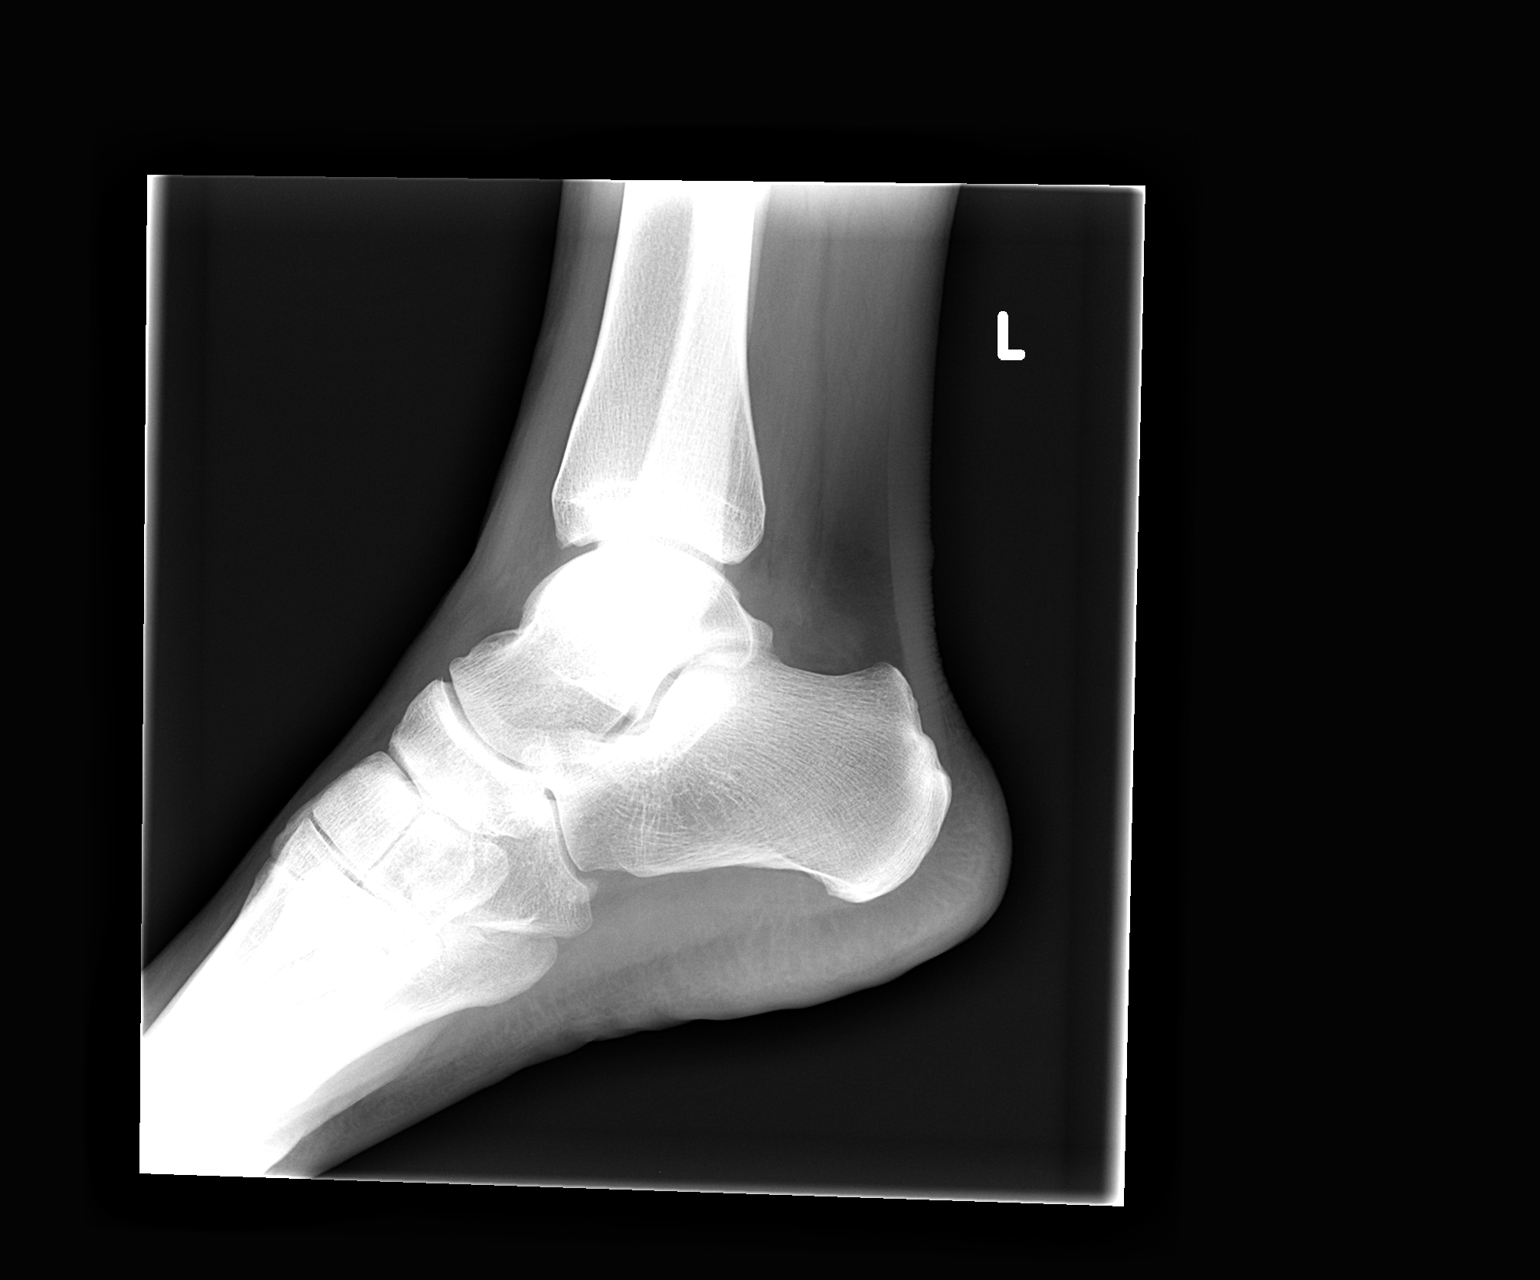

[3 of 3 positions shown; findings below may reference images not displayed]

IMPRESSION: No evidence of acute fracture or dislocation.  
 [REDACTED]

## 2005-10-21 ENCOUNTER — Ambulatory Visit (HOSPITAL_COMMUNITY): Admission: RE | Admit: 2005-10-21 | Discharge: 2005-10-21 | Payer: Self-pay | Admitting: Family Medicine

## 2007-04-02 ENCOUNTER — Ambulatory Visit: Payer: Self-pay | Admitting: Internal Medicine

## 2007-07-23 ENCOUNTER — Ambulatory Visit (HOSPITAL_COMMUNITY): Admission: RE | Admit: 2007-07-23 | Discharge: 2007-07-23 | Payer: Self-pay | Admitting: Family Medicine

## 2007-07-23 IMAGING — US US ABDOMEN COMPLETE
1 series · 13 of 25 positions shown · non-contrast
Comparison: [DATE].

CLINICAL DATA: 50-year-old with nausea and abdominal pain.  
 ABDOMEN ULTRASOUND:
TECHNIQUE: Complete abdominal ultrasound examination was performed including evaluation of the liver, gallbladder, bile ducts, pancreas, kidneys, spleen, IVC, and abdominal aorta.

[Series 1: us abdomen complete · 0.30mm/px · 13 of 90 slices shown]
[im 1/90]
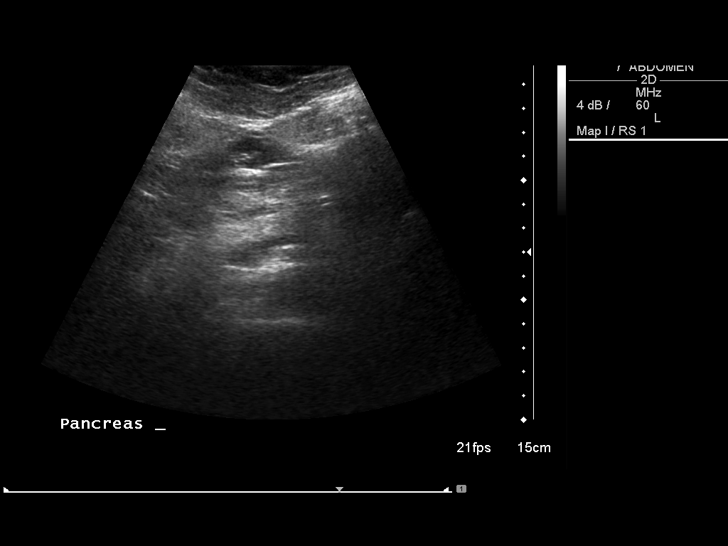
[im 8/90]
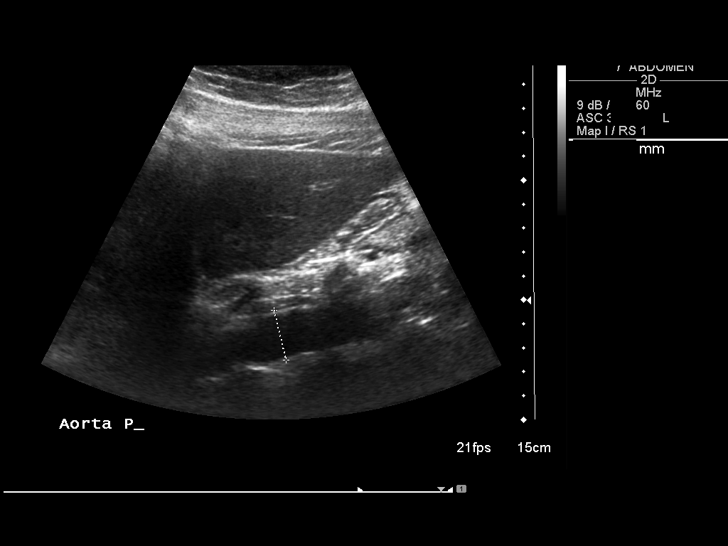
[im 15/90]
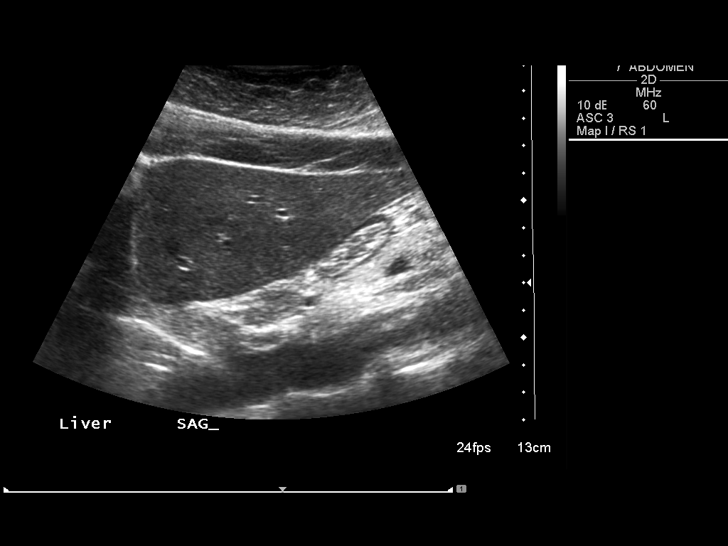
[im 23/90]
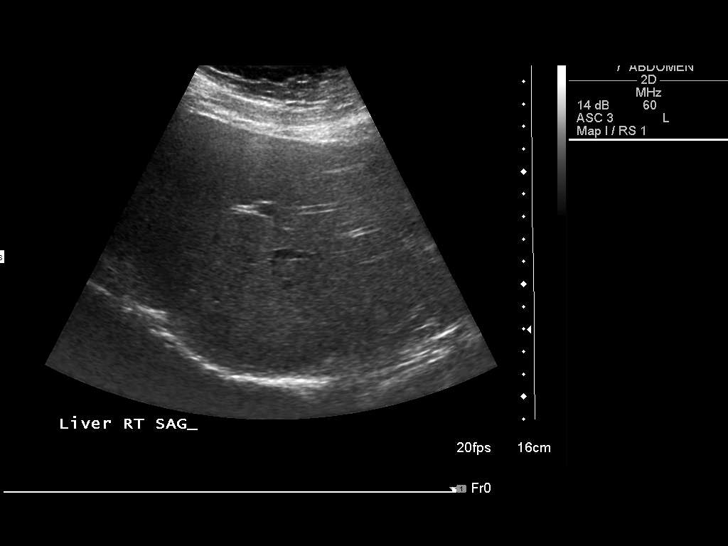
[im 30/90]
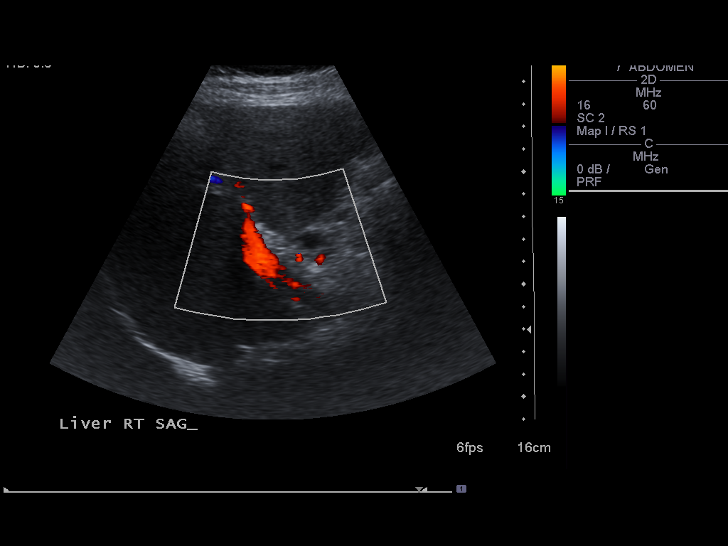
[im 38/90]
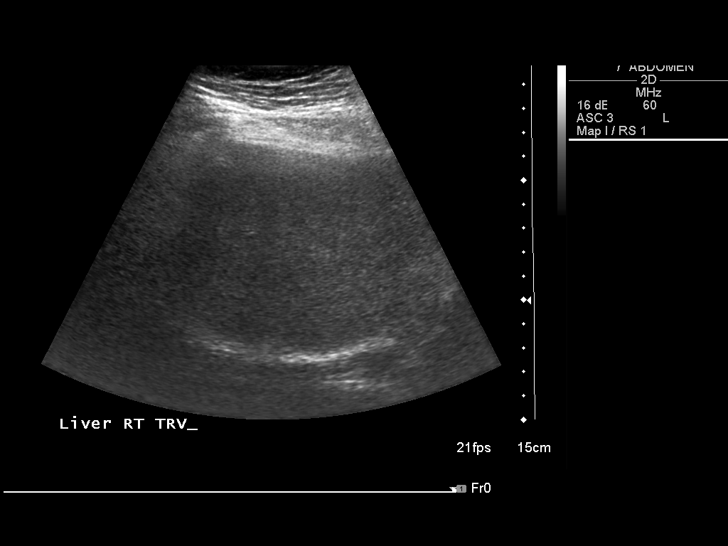
[im 45/90]
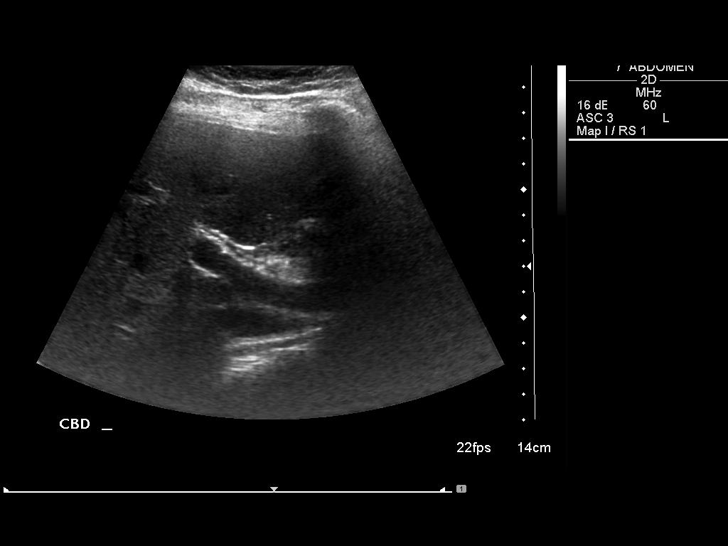
[im 52/90]
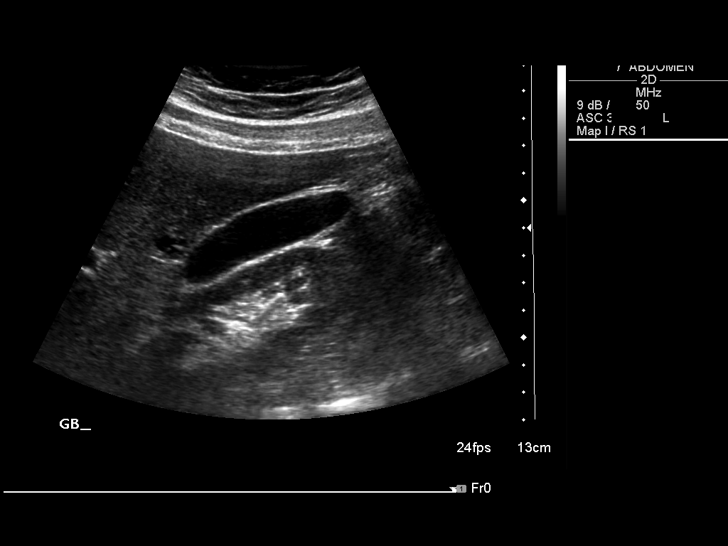
[im 60/90]
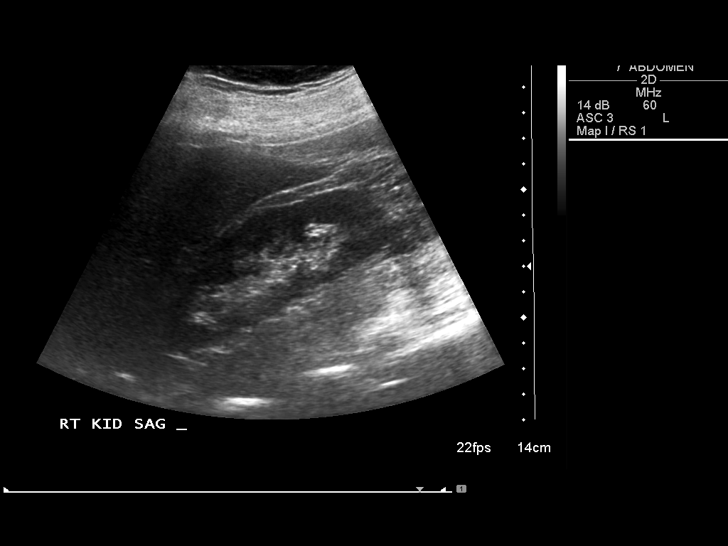
[im 67/90]
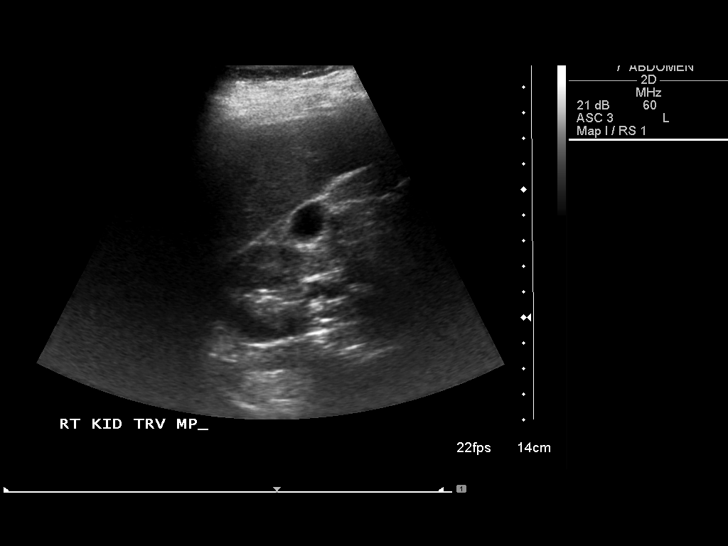
[im 75/90]
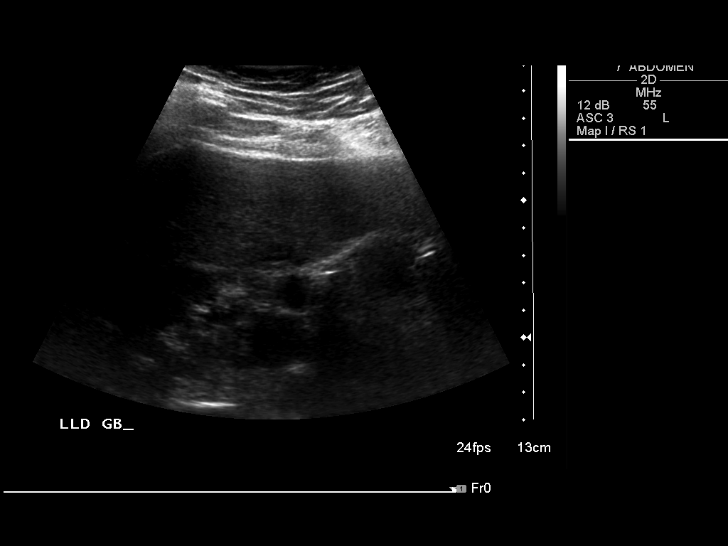
[im 82/90]
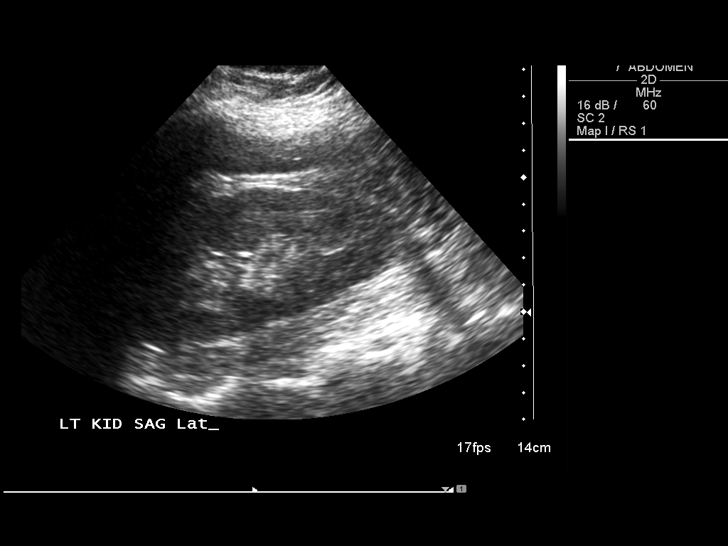
[im 90/90]
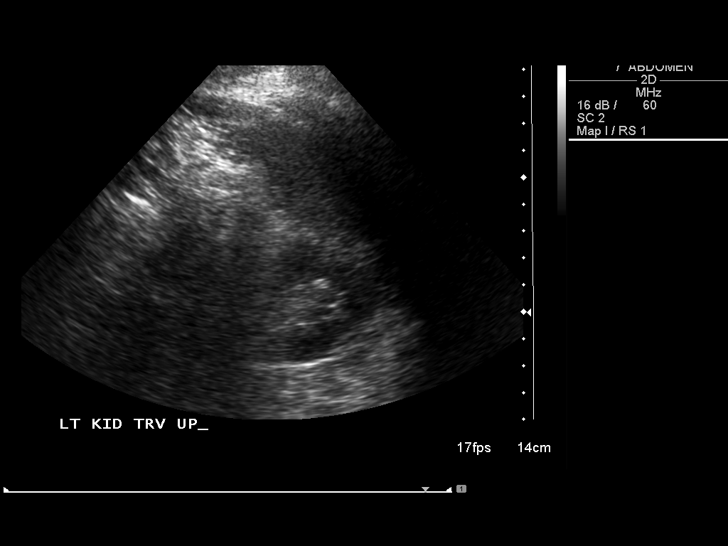

[13 of 25 positions shown; findings below may reference images not displayed]

FINDINGS: The liver demonstrates normal echogenicity.  No intrahepatic biliary dilatation.  There is a small, 1.2 x 1.1 x 1.1 cm cyst near the gallbladder fossa.  This was also present on the prior study but has increased slightly in size.  There are no worrisome imaging features.  The common bile duct measures 1.3 mm, which is normal.  The gallbladder is sonographically normal.  
 The IVC and the aorta are normal in caliber.  The pancreas is fairly well visualized and demonstrates no sonographic abnormalities.  The tail is incompletely seen.  The spleen is normal in size and demonstrates normal echogenicity.  
 The right kidney measures 10.3 cm and the left kidney measures 10.5 cm.  Both kidneys demonstrate normal renal cortical thickness and echogenicity without focal lesions or hydronephrosis.
IMPRESSION: 1.  Unremarkable abdominal ultrasound examination.  
 2.   Small benign appearing hepatic cyst near the gallbladder.  
 3.  Limited visualization of the pancreatic tail.

## 2007-08-31 ENCOUNTER — Ambulatory Visit (HOSPITAL_COMMUNITY): Admission: RE | Admit: 2007-08-31 | Discharge: 2007-08-31 | Payer: Self-pay | Admitting: Internal Medicine

## 2007-08-31 ENCOUNTER — Encounter: Payer: Self-pay | Admitting: Internal Medicine

## 2007-08-31 ENCOUNTER — Ambulatory Visit: Payer: Self-pay | Admitting: Internal Medicine

## 2009-12-08 ENCOUNTER — Emergency Department (HOSPITAL_COMMUNITY): Admission: EM | Admit: 2009-12-08 | Discharge: 2009-12-08 | Payer: Self-pay | Admitting: Emergency Medicine

## 2009-12-08 IMAGING — CR DG HAND COMPLETE 3+V*L*
3 series · 3 of 3 positions shown · non-contrast
Comparison: None

CLINICAL DATA: Fall with hand injury and swollen little finger.

LEFT HAND - COMPLETE 3+ VIEW

[view not recorded (1 of 3)]
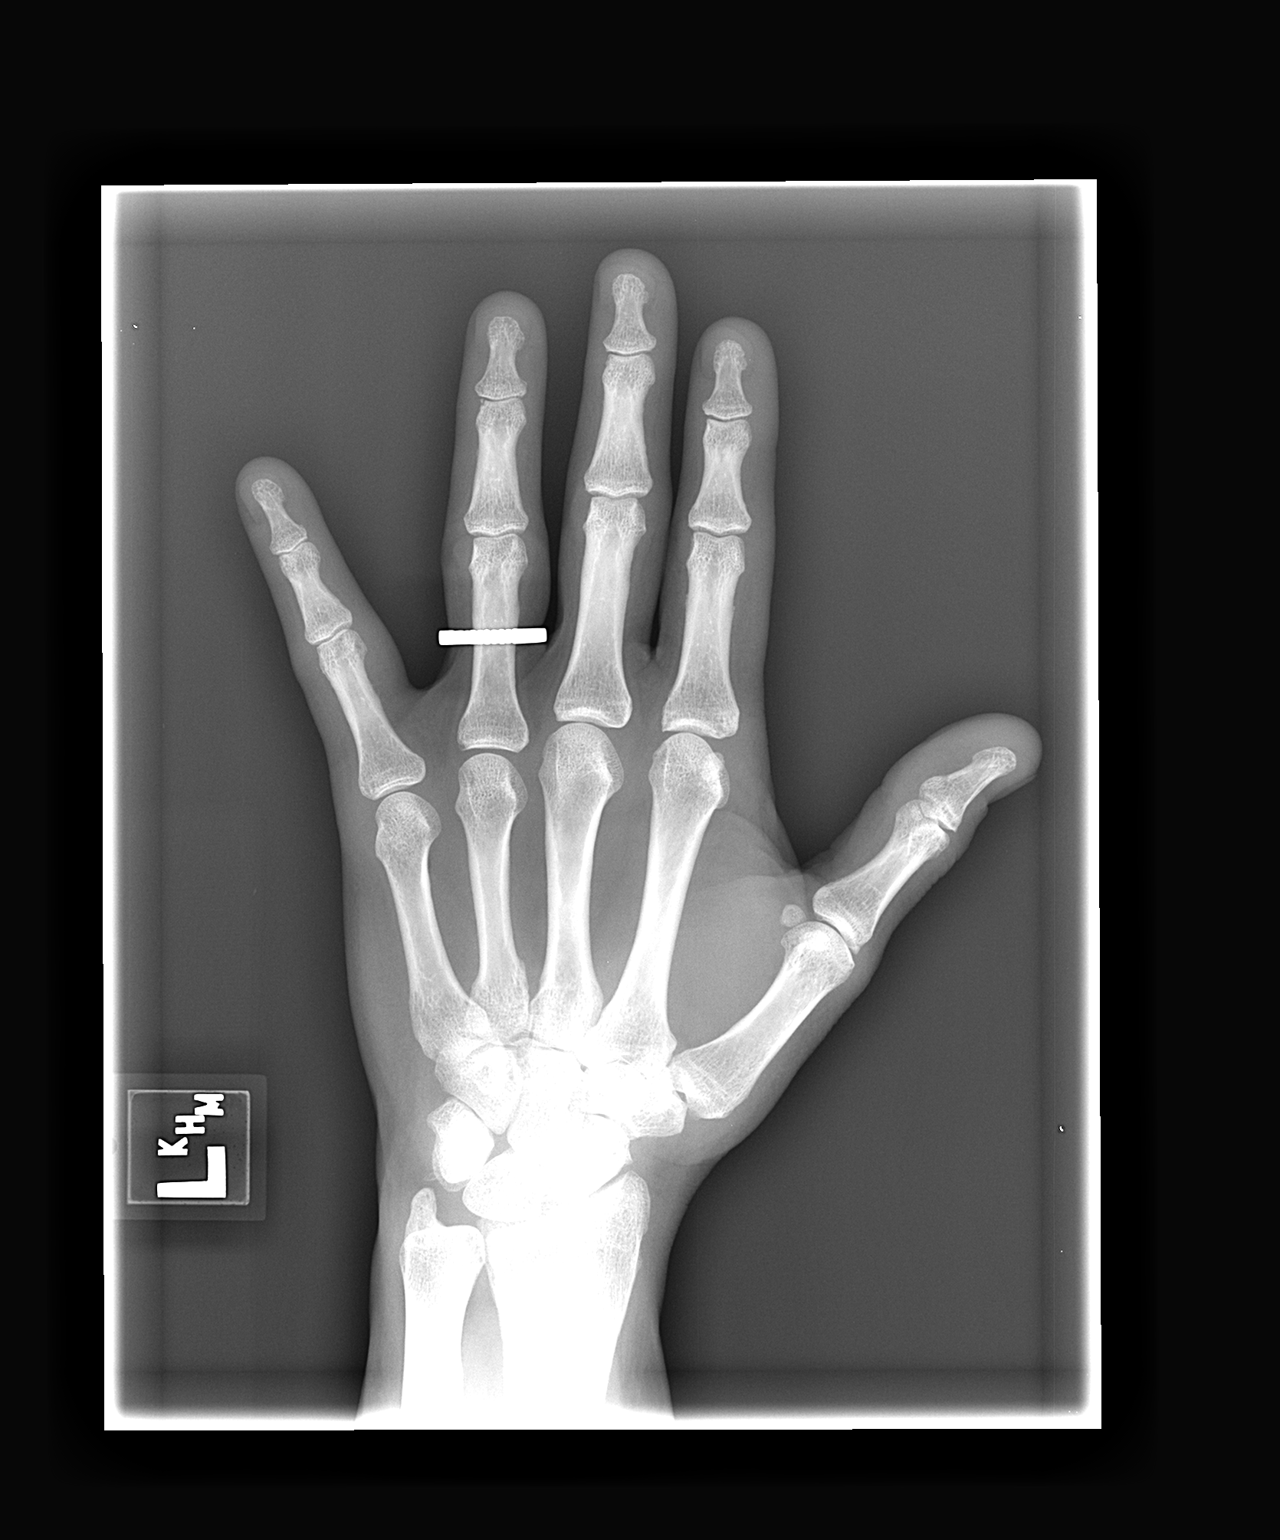

[view not recorded (2 of 3)]
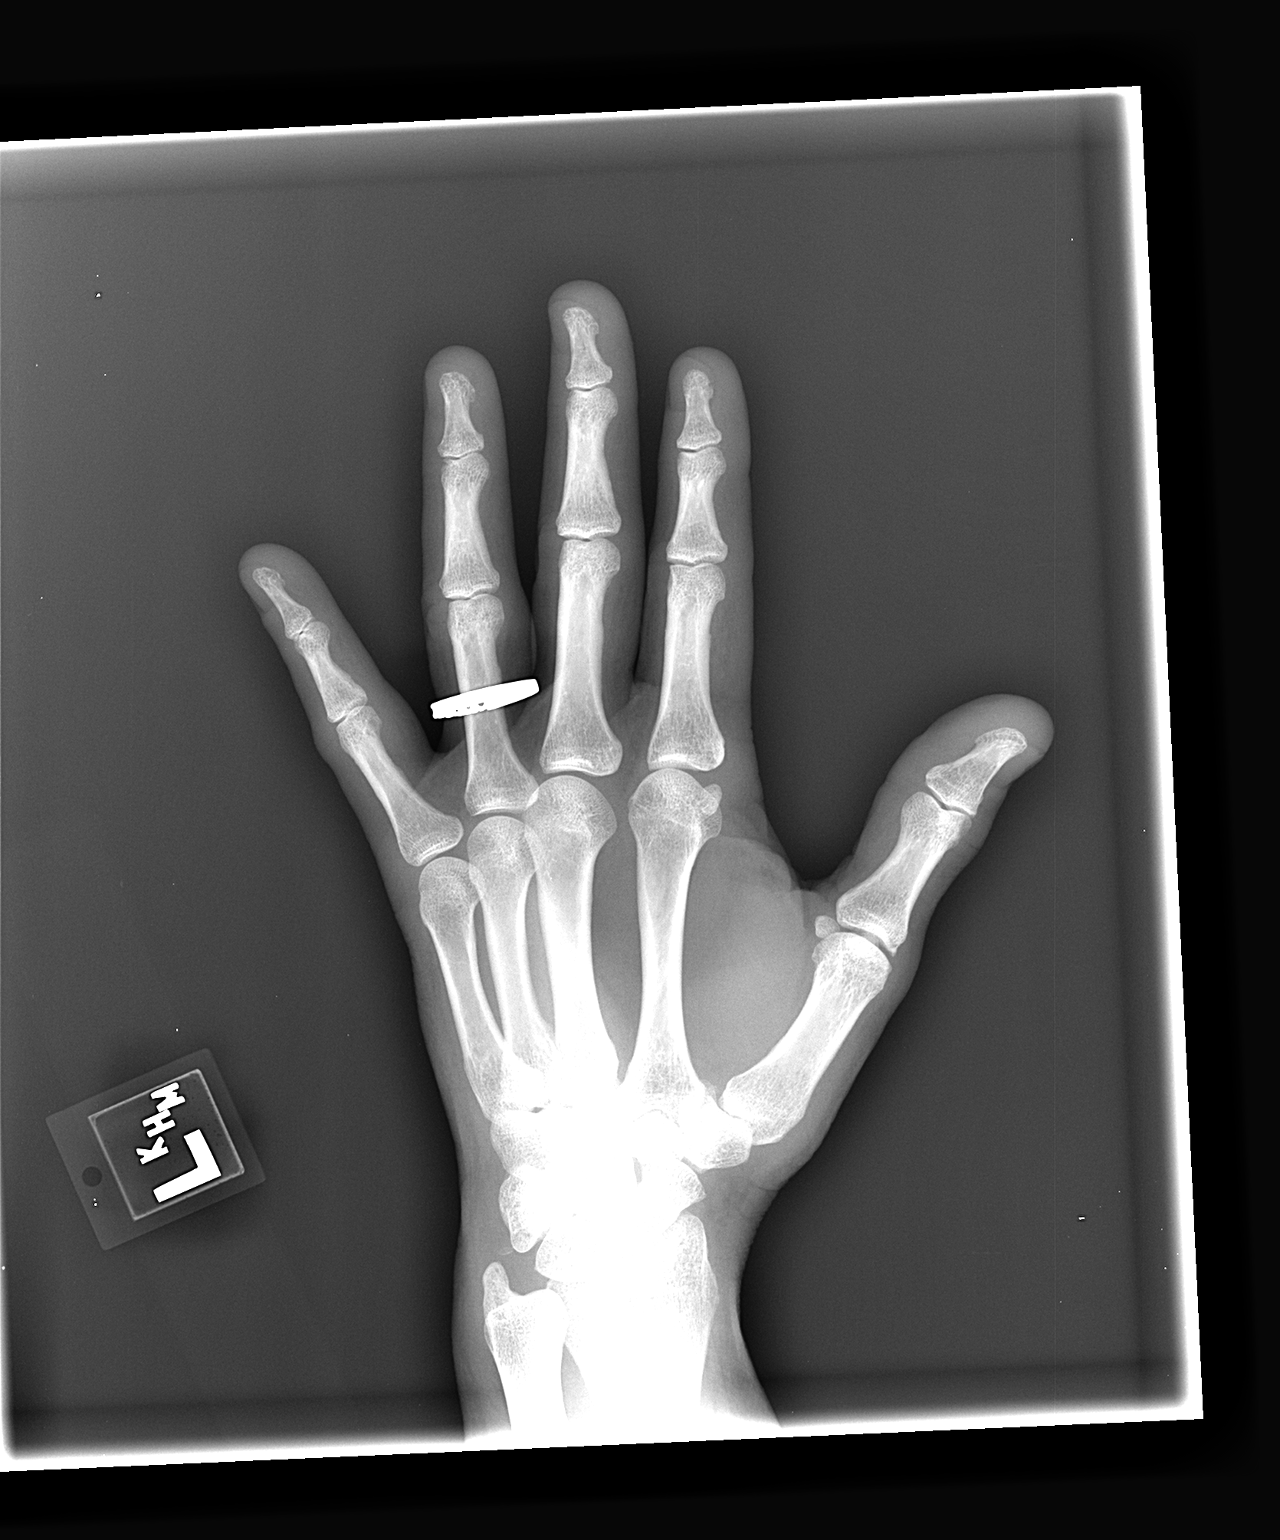

[view not recorded (3 of 3)]
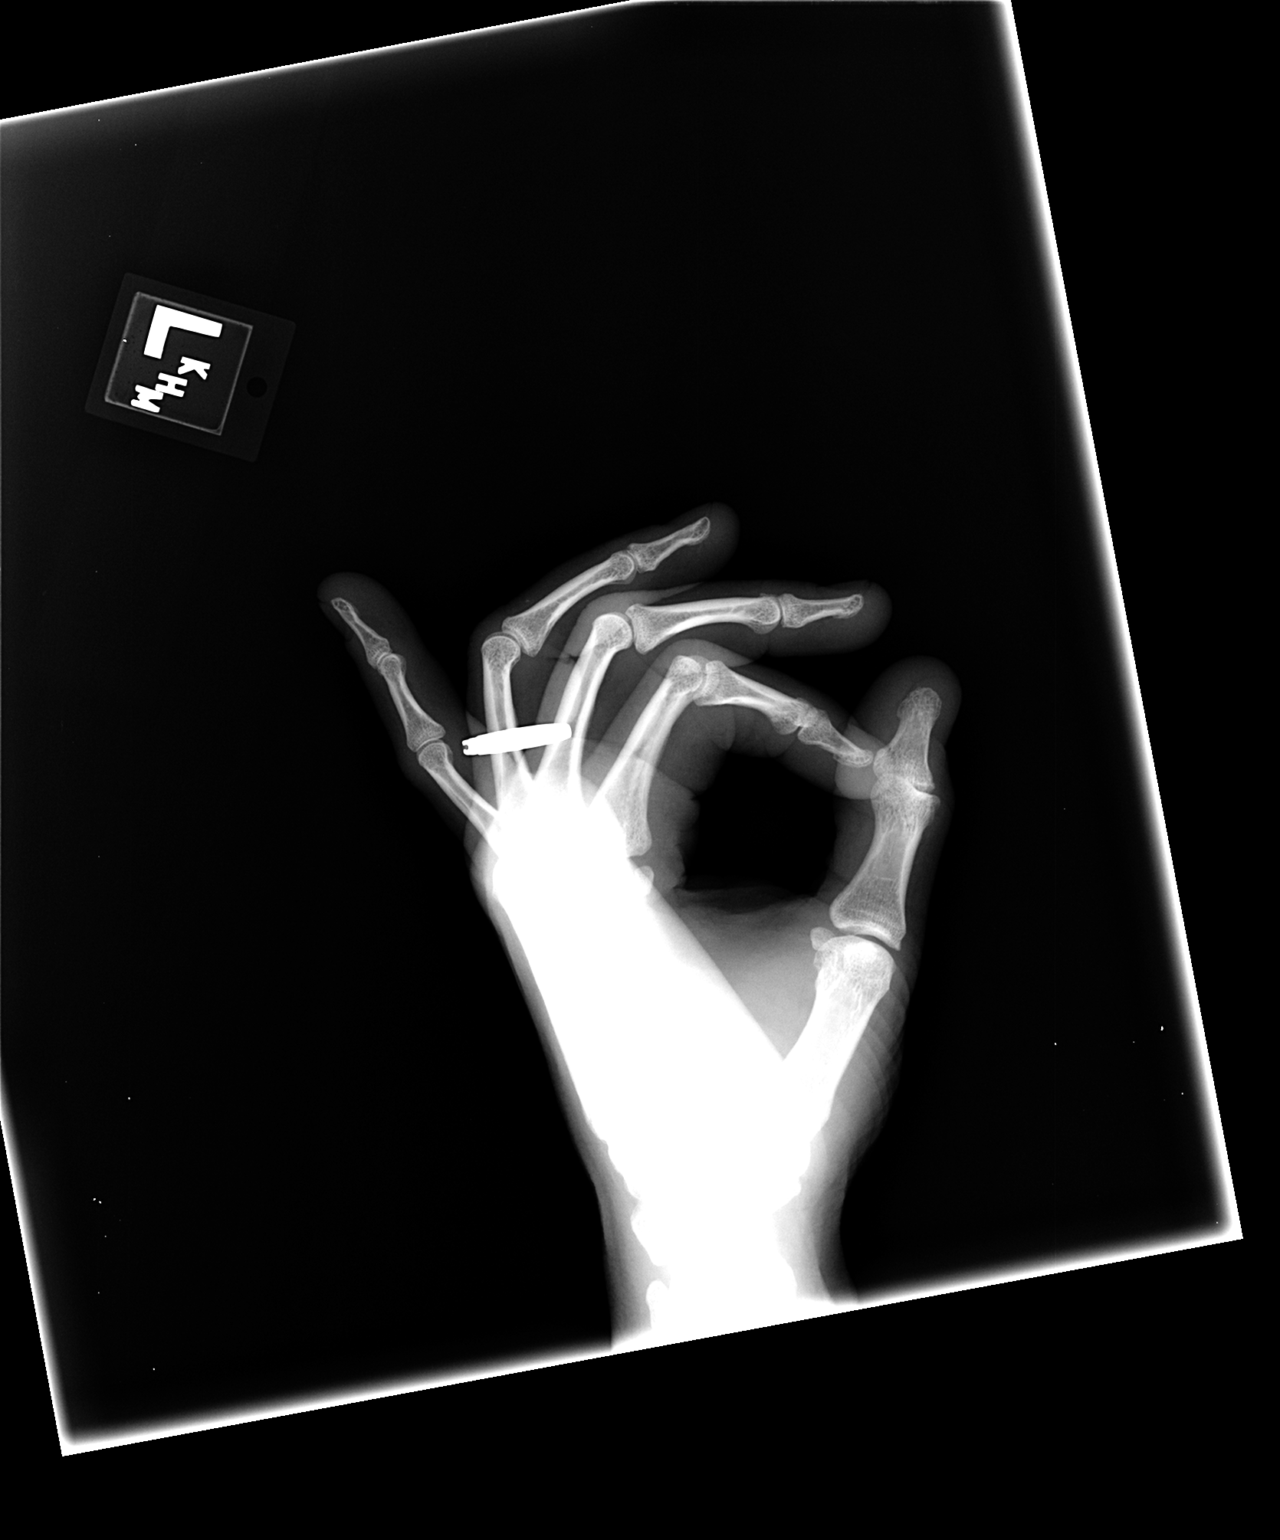

[3 of 3 positions shown; findings below may reference images not displayed]

FINDINGS: No evidence of acute fracture, subluxation or dislocation
identified.

No unexpected radio-opaque foreign bodies are present.

No focal bony lesions are noted.

The joint spaces are unremarkable.
IMPRESSION: No evidence of acute bony abnormality.

## 2010-11-25 ENCOUNTER — Encounter: Payer: Self-pay | Admitting: Family Medicine

## 2011-03-19 NOTE — Op Note (Signed)
NAMECHANE, MAGNER               ACCOUNT NO.:  [id number]   MEDICAL RECORD NO.:  [id number]          PATIENT TYPE:  AMB   LOCATION:  DAY                           FACILITY:  [facility]   PHYSICIAN:  R. Roetta Sessions, M.D. DATE OF BIRTH:  05-Jan-1956   DATE OF PROCEDURE:  08/31/2007  DATE OF DISCHARGE:                               OPERATIVE REPORT   PROCEDURE:  Ileocolonoscopy.  Segmental biopsy, stool collection.   INDICATIONS FOR PROCEDURE:  A 56 year old lady with nearly one year  history of chronic nonbloody diarrhea developed when several other  coworkers developed diarrhea first of this year.  Everyone else got  better but she did not, she continues to have vague intermittent  abdominal cramps and nonbloody watery diarrhea.  It has been incessant.  She came to see [place] back in May and we offered her colonoscopy but  because of her arduous work scheduled, she has put it off until now.  Please see my updated handwritten H&P.  Potential risks, benefits,  alternatives have been reviewed, questions answered.  She is agreeable.  Please see documentation medical record.   PROCEDURE NOTE:  O2 saturation, blood pressure, pulse, respirations  monitored throughout entire procedure.   CONSCIOUS SEDATION:  Versed 3 mg IV, Demerol 75 mg IV in divided doses.   INSTRUMENT:  Pentax video chip system.   FINDINGS:  Digital rectal exam revealed no abnormalities.  The prep was  good.  Colon:  Colonic mucosa was surveyed from rectosigmoid junction through  the left, transverse, right colon to area of appendiceal orifice,  ileocecal valve and cecum.  These structures well seen photographed for  the record.  Terminal ileum was then measured 10 cm, from this level  scope slowly withdrawn, all previous mentioned mucosal surfaces were  again seen.  The colonic mucosa appeared entirely normal as did the  terminal ileal mucosa.  The scope was pulled down to the rectum.  Thorough examination of rectal mucosa  was undertaken.  The rectal vault  was small, I was unable to be retroflex but the same reason I was able  see the rectal mucosa very well.  En face it appeared normal, had a  couple of anal papilla at anal verge, otherwise things appeared normal.  Segmental biopsies of the ascending, descending and rectal mucosa were  taken for histologic study.  Also stool residue was suctioned out for  microbiology.  The patient tolerated the procedure well was reacted in  endoscopy.   IMPRESSION:  Anal papilla otherwise normal rectum.  Endoscopic normal-  appearing colon and terminal ileum status post segmental biopsies of the  rectum and colon as described above.  Stool sample collected.   RECOMMENDATIONS:  Continue Imodium for time being.  Will follow-up on  stool studies and path and will render further suggestions in the very  near future.      Jonathon Bellows, M.D.  Electronically Signed     RMR/MEDQ  D:  08/31/2007  T:  08/31/2007  Job:  [id number]   cc:   Patrica Duel, M.D.  Fax: [phone]

## 2011-03-19 NOTE — H&P (Signed)
Brooke Farrell, Brooke Farrell               ACCOUNT NO.:  [id number]   MEDICAL RECORD NO.:  [id number]          PATIENT TYPE:  AMB   LOCATION:                                FACILITY:  [facility]   PHYSICIAN:  R. Roetta Sessions, M.D. DATE OF BIRTH:  1956-07-26   DATE OF ADMISSION:  04/02/2007  DATE OF DISCHARGE:  LH                              HISTORY & PHYSICAL   REASON FOR CONSULTATION:  Diarrhea.   HISTORY OF PRESENT ILLNESS:  [name] is a pleasant 55 year old  Caucasian female who has been referred by Dr. Nobie Putnam to further  evaluate a 4 month history of non-bloody diarrhea. Ms. Andreason recalls in  February of this year, developing acute onset of non-bloody diarrhea  along with multiple co-workers. She did not have nausea, vomiting, or  fever. She relates that everyone else got better and she continued to  have diarrhea. She has gone from 2 to 3 formed bowel movement daily to 5  or more, poorly formed, soupy, non-bloody bowel movements. She has  nocturnal diarrhea and she has the severe urge to have bowel movement  after eating. She apparently has had some stool studies through Dr.  Geanie Logan office, which came back negative. She has failed to improve  empirically with a course of Flagyl. She has not had any other  antibiotics recently. There is no family history of inflammatory bowel  disease. There is nothing really to go with lactose intolerance.   __________  performed a screening colonoscopy on this nice lady back in  August of 2005 and found no abnormalities of the rectum or colon. It was  recommended that she had a repeat colonoscopy in 10 years.   PAST MEDICAL HISTORY:  Depression.   PAST SURGICAL HISTORY:  None.   CURRENT MEDICATIONS:  Singulair p.r.n.   FAMILY HISTORY:  Mother is alive with hip fracture and mastectomy with  history of breast cancer. She has some heart problems. Father had  diabetes and heart disease. He is deceased. No history of chronic GI or  liver illness.   SOCIAL HISTORY:  The patient is married. She has 1 healthy son, 55 years  of age. She is employed with [employer] in [place]. No tobacco, no  alcohol.   REVIEW OF SYSTEMS:  No chest pain or dyspnea on exertion. No fever or  chills. She is 20 pounds lighter than when I saw her in 2005 but states  she has actually gained 10 pounds recently.   PHYSICAL EXAMINATION:  GENERAL:  A pleasant 55 year old lady, resting  comfortably.  VITAL SIGNS:  Weight 162.5. Height 5 foot 9. Temperature 98.4, blood  pressure 122/88, pulse 72.  SKIN:  Warm and dry. There is no jaundice.  HEENT:  No scleral icterus. Conjunctivae are pink. Oral cavity, no  lesions.  CHEST:  Lungs are clear to auscultation.  CARDIOVASCULAR:  Regular rate and rhythm without murmur, rub, or gallop.  ABDOMEN:  Nondistended. Positive bowel sounds. Soft, nontender. No  appreciable mass or organomegaly.  EXTREMITIES:  No edema.  RECTAL:  Examination is deferred at the time of  colonoscopy.   IMPRESSION:  [name] is a pleasant 55 year old lady with  abdominal cramps and diarrhea of 4 months duration at this time. The  fact that the other co-workers had developed self-limiting diarrhea at  the same time may or not may be significant and may be simply  coincidence or it is possible that she may have had a viral or food born  illness and has been left with post-infectious irritable bowel syndrome.  Other diagnostic possibilities would include new onset microscopic  colitis, inflammatory bowel disease (Crohn's ileitis), or even celiac  disease.   RECOMMENDATIONS:  Would pursue further workup starting with a  colonoscopy with ileostomy and segmental colonic biopsies. At the time  of colonoscopy, we can also submit a repeat set of fresh stool studies.  Will also take the liberty of obtaining a celiac antibody panel at this  time. Potential risks, benefits, and alternatives of this procedure have  been  reviewed with [name]. She is agreeable. Her questions were  answered. We will plan to initiate evaluation in the very near future. I  have asked her to continue to use Imodium and would have no problems in  her taking upwards of 4 tablets daily for the time being.   I would like to thank Dr. Patrica Duel for allowing me to assist this  very nice lady once again. Further recommendations to follow.      Jonathon Bellows, M.D.  Electronically Signed     RMR/MEDQ  D:  04/02/2007  T:  04/02/2007  Job:  [id number]   cc:   Patrica Duel, M.D.  Fax: [phone]

## 2011-03-22 NOTE — Op Note (Signed)
Brooke Farrell, Brooke Farrell                         ACCOUNT NO.:  [id number]   MEDICAL RECORD NO.:  [id number]                   PATIENT TYPE:  AMB   LOCATION:  DAY                                  FACILITY:  [facility]   PHYSICIAN:  R. Roetta Sessions, M.D.              DATE OF BIRTH:  [birth year]   DATE OF PROCEDURE:  06/11/2004  DATE OF DISCHARGE:                                 OPERATIVE REPORT   PROCEDURE:  Screening colonoscopy.   INDICATIONS FOR PROCEDURE:  The patient is a 55 year old lady with recent  constipation and abdominal bloating.  She was seen earlier in the year, and  it was recommended that she go ahead and just have a screening colonoscopy.  Her abdominal bloating/constipation symptoms have been combated __________  with Metamucil dosing daily on a p.r.n. basis.  The approach of colonoscopy  has been discussed with [name] at length.  The potential risks,  benefits, and alternatives have been reviewed and questions answered.  She  is agreeable.   PROCEDURE:  O2 saturation, blood pressure, pulses, and respirations were  monitored throughout the entirety of the procedure.  Conscious sedation was  with Versed 3 mg IV, Demerol 50 mg IV in divided doses.  The instrument used  was the Olympus video chip system.   FINDINGS:  Digital rectal examination revealed no abnormalities.   ENDOSCOPIC FINDINGS:  The prep was good.   Rectum:  Examination of the rectal mucosa including retroflex of the anal  verge demonstrated no abnormalities.   Colon:  The colonic mucosa was surveyed from the rectosigmoid junction  through the left, transverse, right colon to the area of the appendiceal  orifice, ileocecal valve, and cecum.  These structures were well-seen and  photographed for the record.  From this level, the scope was slowly  withdrawn.  All previously mentioned mucosal surfaces were again seen.  The  colonic mucosa appeared normal.  The patient tolerated the procedure well  and was  reactive in endoscopy.   IMPRESSION:  1. Normal rectum.  2. Normal colon.   RECOMMENDATIONS:  1. Continue daily fiber supplementation.  2. Repeat screening colonoscopy in 10 years.      ___________________________________________                                            Jonathon Bellows, M.D.   RMR/MEDQ  D:  06/11/2004  T:  06/11/2004  Job:  [id number]   cc:   Corrie Mckusick, M.D.  [address] [place]. [place]  [place]  [place] [id number]  Fax: [phone]

## 2011-03-22 NOTE — Consult Note (Signed)
NAMEMARAJADE, Brooke Farrell                         ACCOUNT NO.:  [id number]   MEDICAL RECORD NO.:  [id number]                   PATIENT TYPE:  OUT   LOCATION:  RAD                                  FACILITY:  [facility]   PHYSICIAN:  R. Roetta Sessions, M.D.              DATE OF BIRTH:  September 04, 1956   DATE OF CONSULTATION:  03/05/2004  DATE OF DISCHARGE:  02/22/2004                                   CONSULTATION   PRIMARY CARE PHYSICIAN:  Corrie Mckusick, M.D.   REASON FOR CONSULTATION:  Constipation and abdominal bloating.   HISTORY OF PRESENT ILLNESS:  Brooke Farrell is a 55 year old lady who was  referred to [place] to further evaluate a several month history of abdominal  bloating on almost a daily basis.  She described having a bowel movement  typically in the morning without much difficulty.  She minimizes straining.  Has not had any melena or rectal bleeding.  Gets a bloating sensation in the  afternoon.  Has not really had any diarrhea.  No family history of  colorectal neoplasia, never had her lower GI tract imaged.  Really does not  have much in the way of any upper GI tract symptoms.  She specifically  denies odynophagia, dysphagia, early satiety, reflux symptoms, nausea or  vomiting.  She has not had any change in her weight recently.  There is no  family history of colorectal neoplasia.  CT scan of the abdomen and pelvis  back in March, demonstrated a very small right lobe lesion suspicious for a  cyst.  It was recommended that she have an ultrasound.  This apparently was  done.  We do not have the results as of yet.  Also CT scan of the pelvis  demonstrating right ovarian cyst.  She has seen by Dr. Lisette Grinder and workup is  in progress for that issue.   LABORATORY DATA:  Labs from January 18, 2004:  CBC looked good.  Chem-20  looked good.  TSH 1.875.  ___________11.8.   PAST MEDICAL HISTORY:  1. Depression.  2. Seasonal allergies.   PAST SURGICAL HISTORY:  No prior surgeries.    MEDICATIONS:  1. Lexapro 10 mg daily.  2. Zyrtec 10 mg daily.   ALLERGIES:  No known drug allergies.   FAMILY HISTORY:  Father succumbed to multiple myeloma a few years ago.  Mother has a history of breast and ovarian cancer.   SOCIAL HISTORY:  The patient has been married for 10 years, one child.  Works for [employer].  No tobacco, has 2 to 3 mixed drinks weekly.   REVIEW OF SYSTEMS:  No chest pain, no dyspnea on exertion, no fever, chills,  change in weight.   PHYSICAL EXAMINATION:  GENERAL:  A pleasant 55 year old lady resting  comfortably.  VITAL SIGNS:  Weight 185, blood pressure 120/64, pulse 76, height 5 feet 9.5  inches, temperature 96.5.  SKIN:  Warm  and dry.  HEENT:  No scleral icterus.  Conjunctivae are pink.  Oral cavity with teeth  in good repair.  NECK:  JVD is not prominent.  CHEST:  Lungs are clear to auscultation.  CARDIOVASCULAR:  Regular rate and rhythm without murmurs, rubs, or gallops.  BREASTS:  Deferred.  ABDOMEN:  Nondistended, positive bowel sounds, soft, nontender without  appreciable mass or organomegaly.  EXTREMITIES:  No edema.  RECTAL:  Deferred until colonoscopy.   IMPRESSION:  Brooke Farrell is a 55 year old lady with daily abdominal  bloating.  CT scan as noted above.  I do not have the ultrasound for review  at this point in time.  Does not really have any alarm symptoms.  She is  concerned about colorectal cancer and colorectal cancer screening.  Although  she is just shy of the threshold age for colon cancer, I have decided to go  ahead and offer her a screening colonoscopy.  This approach has been  discussed with Brooke Farrell.  Potential risks, benefits, and alternatives have  been reviewed, questions answered.  She is agreeable and wishes to proceed.  We talked about the etiology of gas bloating symptoms at some length.  I  provided her with literature on gas bloating symptoms.  We will make further  recommendations once colonoscopy  has been performed.  We will also retrieve  the abdominal ultrasound done previously for review in the interim.   I would like to thank Dr. Assunta Found for letting me see this nice lady  today.      ___________________________________________                                            Jonathon Bellows, M.D.   RMR/MEDQ  D:  03/05/2004  T:  03/05/2004  Job:  [id number]   cc:   Corrie Mckusick, M.D.  [address] [place]. [place]  [place]  [place] [address]  Fax: [phone]

## 2011-08-14 LAB — OVA AND PARASITE EXAMINATION: Ova and parasites: NONE SEEN

## 2011-08-14 LAB — CLOSTRIDIUM DIFFICILE EIA: C difficile Toxins A+B, EIA: NEGATIVE

## 2011-08-14 LAB — FECAL LACTOFERRIN, QUANT: Fecal Lactoferrin: NEGATIVE

## 2011-08-14 LAB — STOOL CULTURE

## 2011-10-30 DIAGNOSIS — F32A Depression, unspecified: Secondary | ICD-10-CM | POA: Insufficient documentation

## 2011-10-30 DIAGNOSIS — IMO0001 Reserved for inherently not codable concepts without codable children: Secondary | ICD-10-CM | POA: Insufficient documentation

## 2012-05-25 DIAGNOSIS — A630 Anogenital (venereal) warts: Secondary | ICD-10-CM | POA: Insufficient documentation

## 2013-01-21 DIAGNOSIS — Z9882 Breast implant status: Secondary | ICD-10-CM | POA: Insufficient documentation

## 2014-05-29 DIAGNOSIS — R7989 Other specified abnormal findings of blood chemistry: Secondary | ICD-10-CM | POA: Insufficient documentation

## 2014-06-06 DIAGNOSIS — K222 Esophageal obstruction: Secondary | ICD-10-CM | POA: Insufficient documentation

## 2014-11-11 ENCOUNTER — Telehealth: Payer: Self-pay | Admitting: Internal Medicine

## 2014-11-16 NOTE — Telephone Encounter (Signed)
Pt records reviewed and denied by Dr. Marina GoodellPerry.  Pt informed.

## 2015-01-16 DIAGNOSIS — K2100 Gastro-esophageal reflux disease with esophagitis, without bleeding: Secondary | ICD-10-CM | POA: Insufficient documentation

## 2015-01-16 DIAGNOSIS — F411 Generalized anxiety disorder: Secondary | ICD-10-CM | POA: Insufficient documentation

## 2015-04-05 DIAGNOSIS — R8290 Unspecified abnormal findings in urine: Secondary | ICD-10-CM | POA: Insufficient documentation

## 2015-07-27 DIAGNOSIS — R059 Cough, unspecified: Secondary | ICD-10-CM | POA: Insufficient documentation

## 2015-07-27 DIAGNOSIS — H1010 Acute atopic conjunctivitis, unspecified eye: Secondary | ICD-10-CM | POA: Insufficient documentation

## 2015-07-27 DIAGNOSIS — R05 Cough: Secondary | ICD-10-CM | POA: Insufficient documentation

## 2015-07-27 DIAGNOSIS — J309 Allergic rhinitis, unspecified: Principal | ICD-10-CM

## 2015-08-01 ENCOUNTER — Telehealth: Payer: Self-pay

## 2015-08-01 ENCOUNTER — Ambulatory Visit (INDEPENDENT_AMBULATORY_CARE_PROVIDER_SITE_OTHER): Payer: BLUE CROSS/BLUE SHIELD

## 2015-08-01 DIAGNOSIS — J309 Allergic rhinitis, unspecified: Secondary | ICD-10-CM

## 2015-08-01 NOTE — Telephone Encounter (Signed)
Patient tolerated well.

## 2015-08-15 ENCOUNTER — Ambulatory Visit (INDEPENDENT_AMBULATORY_CARE_PROVIDER_SITE_OTHER): Payer: BLUE CROSS/BLUE SHIELD

## 2015-08-15 DIAGNOSIS — J309 Allergic rhinitis, unspecified: Secondary | ICD-10-CM

## 2015-08-15 NOTE — Progress Notes (Signed)
REPEATED DOSE DUE TO TIME.

## 2015-08-29 ENCOUNTER — Ambulatory Visit (INDEPENDENT_AMBULATORY_CARE_PROVIDER_SITE_OTHER): Payer: BLUE CROSS/BLUE SHIELD

## 2015-08-29 DIAGNOSIS — J301 Allergic rhinitis due to pollen: Secondary | ICD-10-CM | POA: Diagnosis not present

## 2015-09-05 ENCOUNTER — Ambulatory Visit (INDEPENDENT_AMBULATORY_CARE_PROVIDER_SITE_OTHER): Payer: BLUE CROSS/BLUE SHIELD | Admitting: Neurology

## 2015-09-05 DIAGNOSIS — J309 Allergic rhinitis, unspecified: Secondary | ICD-10-CM | POA: Diagnosis not present

## 2015-09-12 ENCOUNTER — Ambulatory Visit (INDEPENDENT_AMBULATORY_CARE_PROVIDER_SITE_OTHER): Payer: BLUE CROSS/BLUE SHIELD

## 2015-09-12 DIAGNOSIS — J301 Allergic rhinitis due to pollen: Secondary | ICD-10-CM | POA: Diagnosis not present

## 2015-09-19 ENCOUNTER — Ambulatory Visit (INDEPENDENT_AMBULATORY_CARE_PROVIDER_SITE_OTHER): Payer: BLUE CROSS/BLUE SHIELD

## 2015-09-19 DIAGNOSIS — J301 Allergic rhinitis due to pollen: Secondary | ICD-10-CM | POA: Diagnosis not present

## 2015-09-21 ENCOUNTER — Ambulatory Visit: Payer: Self-pay | Admitting: Allergy and Immunology

## 2015-10-03 ENCOUNTER — Encounter: Payer: Self-pay | Admitting: Allergy and Immunology

## 2015-10-03 ENCOUNTER — Ambulatory Visit (INDEPENDENT_AMBULATORY_CARE_PROVIDER_SITE_OTHER): Payer: BLUE CROSS/BLUE SHIELD | Admitting: Allergy and Immunology

## 2015-10-03 ENCOUNTER — Ambulatory Visit (INDEPENDENT_AMBULATORY_CARE_PROVIDER_SITE_OTHER): Payer: BLUE CROSS/BLUE SHIELD

## 2015-10-03 VITALS — BP 94/66 | HR 92 | Temp 98.5°F | Resp 20

## 2015-10-03 DIAGNOSIS — R059 Cough, unspecified: Secondary | ICD-10-CM

## 2015-10-03 DIAGNOSIS — R062 Wheezing: Secondary | ICD-10-CM

## 2015-10-03 DIAGNOSIS — R05 Cough: Secondary | ICD-10-CM

## 2015-10-03 DIAGNOSIS — J309 Allergic rhinitis, unspecified: Secondary | ICD-10-CM | POA: Diagnosis not present

## 2015-10-03 MED ORDER — EPINEPHRINE 0.3 MG/0.3ML IJ SOAJ: 0.3000 mg | INTRAMUSCULAR | Status: DC | PRN

## 2015-10-03 MED ORDER — AZELASTINE-FLUTICASONE 137-50 MCG/ACT NA SUSP: 1.0000 | Freq: Two times a day (BID) | NASAL | Status: DC

## 2015-10-03 NOTE — Progress Notes (Signed)
FOLLOW UP NOTE  RE: Brooke Farrell MRN: [id number] DOB: 12/10/1955 [facility] [place] [address] , [place] [address] Date of Office Visit: 10/03/2015  Subjective:  Brooke Farrell is a 59 y.o. female who presents today for Medication Management  Assessment:   1.  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    2.  Previous history of cough and wheeze currently asymptomatic with excellent in office spirometry.   Plan:  1.  Brooke Farrell will restart medications, in mid February-as Xyzal 5 mg daily and Dymista one to two sprays daily with Singulair 10 mg daily in preparation for spring fluctuant weather patterns. 2.  Continue immunotherapy as significantly beneficial.--Emergency action plan in place for protocol. 3.  ProAir as needed. 4.  Follow-up in 6 months or sooner if needed.  Meds ordered this encounter  Medications  . Azelastine-Fluticasone (DYMISTA) 137-50 MCG/ACT SUSP    Sig: Place 1 spray into the nose 2 (two) times daily.    Dispense:  1 Bottle    Refill:  5  . EPINEPHrine (EPIPEN 2-PAK) 0.3 mg/0.3 mL IJ SOAJ injection    Sig: Inject 0.3 mLs (0.3 mg total) into the muscle as needed (for severe life-threatening allergic reaction).    Dispense:  2 Device    Refill:  1    Place RX on hold until patients request.   HPI: Brooke Farrell returns to the office in follow-up of allergic rhinoconjunctivitis and previous history of cough and wheeze.  She is receiving immunotherapy without any large local or systemic reactions is pleased with how well she has done since her last visit in May.  She was able to participate ins her grandsons outdoor field trips without significant increasing symptoms or additional medications.  She finds fall and winter season are very good for her.  Describes normal sleep and activity.  She denies ED or urgent care visits or recent prednisone or antibiotics.  She did have high-dose prednisone and  noted mood disruption was significant and prefers to avoid and has been using medication for sleep is related to recent stressors.  Current Medications: 1.  Singulair 10 mg and Xyzal 5 mg mg daily as needed. 2.  Dymista one to 2 sprays daily as needed. 3.  Epinephrine/Benadryl as needed. 4.  Continues Prozac, lisinopril and Ambien daily.  Drug Allergies: Allergies  Allergen Reactions  . Robitussin (Alcohol Free)  [Guaifenesin] Swelling  . Amoxicillin Diarrhea   Objective:   Filed Vitals:   10/03/15 1109  BP: 94/66  Pulse: 92  Temp: 98.5 F (36.9 C)  Resp: 20  pulse ox             95% Physical Exam  Constitutional: She is well-developed, well-nourished, and in no distress.  HENT:  Head: Atraumatic.  Right Ear: Tympanic membrane and ear canal normal.  Left Ear: Tympanic membrane and ear canal normal.  Nose: Mucosal edema present. No rhinorrhea. No epistaxis.  Mouth/Throat: Oropharynx is clear and moist and mucous membranes are normal. No oropharyngeal exudate, posterior oropharyngeal edema or posterior oropharyngeal erythema.  Neck: Neck supple.  Cardiovascular: Normal rate, S1 normal and S2 normal.   No murmur heard. Pulmonary/Chest: Effort normal. She has no wheezes. She has no rhonchi. She has no rales.  Lymphadenopathy:    She has no cervical adenopathy.    Diagnostics: Spirometry:  FVC  3.57--108%, FEV1  2.93--111%.    Roselyn M. Willa RoughHicks, MD  cc: Antony HasteMichael Badger, MD

## 2015-10-10 ENCOUNTER — Ambulatory Visit (INDEPENDENT_AMBULATORY_CARE_PROVIDER_SITE_OTHER): Payer: BLUE CROSS/BLUE SHIELD

## 2015-10-10 DIAGNOSIS — J309 Allergic rhinitis, unspecified: Secondary | ICD-10-CM | POA: Diagnosis not present

## 2015-10-17 ENCOUNTER — Ambulatory Visit (INDEPENDENT_AMBULATORY_CARE_PROVIDER_SITE_OTHER): Payer: BLUE CROSS/BLUE SHIELD

## 2015-10-17 DIAGNOSIS — J309 Allergic rhinitis, unspecified: Secondary | ICD-10-CM | POA: Diagnosis not present

## 2015-10-31 ENCOUNTER — Ambulatory Visit (INDEPENDENT_AMBULATORY_CARE_PROVIDER_SITE_OTHER): Payer: BLUE CROSS/BLUE SHIELD

## 2015-10-31 DIAGNOSIS — J309 Allergic rhinitis, unspecified: Secondary | ICD-10-CM

## 2015-11-07 ENCOUNTER — Ambulatory Visit (INDEPENDENT_AMBULATORY_CARE_PROVIDER_SITE_OTHER): Payer: BLUE CROSS/BLUE SHIELD | Admitting: Neurology

## 2015-11-07 DIAGNOSIS — J309 Allergic rhinitis, unspecified: Secondary | ICD-10-CM | POA: Diagnosis not present

## 2015-11-27 DIAGNOSIS — J301 Allergic rhinitis due to pollen: Secondary | ICD-10-CM | POA: Diagnosis not present

## 2015-11-28 DIAGNOSIS — J3089 Other allergic rhinitis: Secondary | ICD-10-CM | POA: Diagnosis not present

## 2015-12-26 ENCOUNTER — Ambulatory Visit (INDEPENDENT_AMBULATORY_CARE_PROVIDER_SITE_OTHER): Payer: BLUE CROSS/BLUE SHIELD

## 2015-12-26 DIAGNOSIS — J309 Allergic rhinitis, unspecified: Secondary | ICD-10-CM | POA: Diagnosis not present

## 2016-01-02 ENCOUNTER — Ambulatory Visit (INDEPENDENT_AMBULATORY_CARE_PROVIDER_SITE_OTHER): Payer: BLUE CROSS/BLUE SHIELD

## 2016-01-02 DIAGNOSIS — J309 Allergic rhinitis, unspecified: Secondary | ICD-10-CM | POA: Diagnosis not present

## 2016-01-09 ENCOUNTER — Ambulatory Visit (INDEPENDENT_AMBULATORY_CARE_PROVIDER_SITE_OTHER): Payer: BLUE CROSS/BLUE SHIELD

## 2016-01-09 DIAGNOSIS — J309 Allergic rhinitis, unspecified: Secondary | ICD-10-CM | POA: Diagnosis not present

## 2016-01-23 ENCOUNTER — Ambulatory Visit (INDEPENDENT_AMBULATORY_CARE_PROVIDER_SITE_OTHER): Payer: BLUE CROSS/BLUE SHIELD

## 2016-01-23 DIAGNOSIS — J309 Allergic rhinitis, unspecified: Secondary | ICD-10-CM

## 2016-01-30 ENCOUNTER — Ambulatory Visit (INDEPENDENT_AMBULATORY_CARE_PROVIDER_SITE_OTHER): Payer: BLUE CROSS/BLUE SHIELD

## 2016-01-30 DIAGNOSIS — J309 Allergic rhinitis, unspecified: Secondary | ICD-10-CM

## 2016-02-06 ENCOUNTER — Ambulatory Visit (INDEPENDENT_AMBULATORY_CARE_PROVIDER_SITE_OTHER): Payer: BLUE CROSS/BLUE SHIELD

## 2016-02-06 DIAGNOSIS — J309 Allergic rhinitis, unspecified: Secondary | ICD-10-CM

## 2016-02-13 ENCOUNTER — Ambulatory Visit (INDEPENDENT_AMBULATORY_CARE_PROVIDER_SITE_OTHER): Payer: BLUE CROSS/BLUE SHIELD

## 2016-02-13 DIAGNOSIS — J309 Allergic rhinitis, unspecified: Secondary | ICD-10-CM | POA: Diagnosis not present

## 2016-02-20 ENCOUNTER — Ambulatory Visit (INDEPENDENT_AMBULATORY_CARE_PROVIDER_SITE_OTHER): Payer: BLUE CROSS/BLUE SHIELD

## 2016-02-20 DIAGNOSIS — J309 Allergic rhinitis, unspecified: Secondary | ICD-10-CM

## 2016-03-05 ENCOUNTER — Ambulatory Visit (INDEPENDENT_AMBULATORY_CARE_PROVIDER_SITE_OTHER): Payer: BLUE CROSS/BLUE SHIELD | Admitting: Allergy and Immunology

## 2016-03-05 ENCOUNTER — Ambulatory Visit: Payer: BLUE CROSS/BLUE SHIELD | Admitting: Allergy and Immunology

## 2016-03-05 ENCOUNTER — Encounter: Payer: Self-pay | Admitting: Allergy and Immunology

## 2016-03-05 VITALS — BP 90/60 | HR 68 | Temp 98.1°F | Resp 16

## 2016-03-05 DIAGNOSIS — R059 Cough, unspecified: Secondary | ICD-10-CM

## 2016-03-05 DIAGNOSIS — R05 Cough: Secondary | ICD-10-CM

## 2016-03-05 DIAGNOSIS — J309 Allergic rhinitis, unspecified: Secondary | ICD-10-CM

## 2016-03-05 DIAGNOSIS — H1010 Acute atopic conjunctivitis, unspecified eye: Secondary | ICD-10-CM

## 2016-03-05 NOTE — Patient Instructions (Addendum)
   Continue current medication regime--- Flonase, Singulair, Pazeo and Xyzal.  Continue allergy injections.  Saline nasal wash each evening at shower time.  May use azelastine 1-2 sprays each nostril twice daily for increasing congestion.  EpiPen/Benadryl as needed.  Follow-up in 9 months or sooner if needed.

## 2016-03-05 NOTE — Progress Notes (Signed)
     FOLLOW UP NOTE  RE: Brooke Farrell MRN: [id number] DOB: Mar 31, 1956 [facility] [place] [address] [place], [place] [address] Date of Office Visit: 03/05/2016  Subjective:  Brooke Farrell is a 60 y.o. female who presents today for Follow-up and Medication Refill  Assessment:   1. Allergic rhinoconjunctivitis  improved on immunotherapy.   2. Cough with previous history of wheeze currently asymptomatic.    Plan:    Patient Instructions  1.  Continue current medication regime--- Flonase, Singulair, Pazeo and use Xyzal in place of Zyrtec. 2.  Continue allergy injections--given its benefit for [name]. 3.  Saline nasal wash each evening at shower time. 4.  May use azelastine 1-2 sprays each nostril twice daily for increasing congestion/post nasal drip. 5.  EpiPen/Benadryl as needed. 6.  Follow-up in 9 months or sooner if needed.  HPI: Brooke Farrell returns to the office in follow-up of allergic rhinoconjunctivitis, previous cough/wheeze and immunotherapy.  She reports doing very well, pleased with her regime since her last visit in November and has recently restarted medications for the spring, now consistent for the last 2 months. Denies any breathing concerns/cough or wheeze.  She denies any albuterol use, disruption of sleep or activity.  She has been exercising/walking regularly without issue only occasionally notes itchy eyes and post nasal drip, now improved from a few weeks ago.  She feels medications are beneficial and prefers to continue on this regime from which she is usually able to take holiday in the fall/winter season, given her time on allergy injections.  Denies ED or urgent care visits, prednisone or antibiotic courses or other new medical concerns.  Brooke Farrell has a current medication list which includes the following prescription(s): albuterol, azelastine, cetirizine, colestipol, dexlansoprazole, epinephrine, fluoxetine, fluticasone, lisinopril, montelukast,  olopatadine hcl, and zolpidem.   Drug Allergies: Allergies  Allergen Reactions  . Robitussin (Alcohol Free)  [Guaifenesin] Swelling  . Amoxicillin Diarrhea   Objective:   Filed Vitals:   03/05/16 1114  BP: 90/60  Pulse: 68  Temp: 98.1 F (36.7 Farrell)  Resp: 16   Physical Exam  Constitutional: She is well-developed, well-nourished, and in no distress.  HENT:  Head: Atraumatic.  Right Ear: Tympanic membrane and ear canal normal.  Left Ear: Tympanic membrane and ear canal normal.  Nose: Mucosal edema present. No rhinorrhea. No epistaxis.  Mouth/Throat: Oropharynx is clear and moist and mucous membranes are normal. No oropharyngeal exudate, posterior oropharyngeal edema or posterior oropharyngeal erythema.  Neck: Neck supple.  Cardiovascular: Normal rate, S1 normal and S2 normal.   No murmur heard. Pulmonary/Chest: Effort normal. She has no wheezes. She has no rhonchi. She has no rales.  Lymphadenopathy:    She has no cervical adenopathy.   Diagnostics: Spirometry:  FVC 3.18--- 103%, FEV1 2.63--101%.    Taz Vanness M. Willa RoughHicks, MD  cc: Brooke Farrell,Brooke C, MD

## 2016-03-06 ENCOUNTER — Other Ambulatory Visit: Payer: Self-pay | Admitting: *Deleted

## 2016-03-06 MED ORDER — LEVOCETIRIZINE DIHYDROCHLORIDE 5 MG PO TABS: 5.0000 mg | ORAL_TABLET | Freq: Every day | ORAL | Status: DC | PRN

## 2016-03-06 MED ORDER — AZELASTINE HCL 0.1 % NA SOLN: 1.0000 | Freq: Every day | NASAL | Status: DC

## 2016-03-06 MED ORDER — MONTELUKAST SODIUM 10 MG PO TABS: 10.0000 mg | ORAL_TABLET | Freq: Every day | ORAL | Status: DC | PRN

## 2016-03-06 MED ORDER — OLOPATADINE HCL 0.7 % OP SOLN: 1.0000 [drp] | Freq: Every day | OPHTHALMIC | Status: DC

## 2016-03-07 ENCOUNTER — Encounter: Payer: Self-pay | Admitting: Allergy and Immunology

## 2016-03-19 ENCOUNTER — Ambulatory Visit (INDEPENDENT_AMBULATORY_CARE_PROVIDER_SITE_OTHER): Payer: BLUE CROSS/BLUE SHIELD | Admitting: *Deleted

## 2016-03-19 DIAGNOSIS — J309 Allergic rhinitis, unspecified: Secondary | ICD-10-CM | POA: Diagnosis not present

## 2016-03-20 DIAGNOSIS — J301 Allergic rhinitis due to pollen: Secondary | ICD-10-CM | POA: Diagnosis not present

## 2016-03-21 DIAGNOSIS — J3089 Other allergic rhinitis: Secondary | ICD-10-CM | POA: Diagnosis not present

## 2016-04-23 ENCOUNTER — Ambulatory Visit (INDEPENDENT_AMBULATORY_CARE_PROVIDER_SITE_OTHER): Payer: BLUE CROSS/BLUE SHIELD

## 2016-04-23 DIAGNOSIS — J309 Allergic rhinitis, unspecified: Secondary | ICD-10-CM

## 2016-04-30 ENCOUNTER — Ambulatory Visit (INDEPENDENT_AMBULATORY_CARE_PROVIDER_SITE_OTHER): Payer: BLUE CROSS/BLUE SHIELD

## 2016-04-30 DIAGNOSIS — J309 Allergic rhinitis, unspecified: Secondary | ICD-10-CM

## 2016-06-04 ENCOUNTER — Ambulatory Visit (INDEPENDENT_AMBULATORY_CARE_PROVIDER_SITE_OTHER): Payer: BLUE CROSS/BLUE SHIELD

## 2016-06-04 DIAGNOSIS — J309 Allergic rhinitis, unspecified: Secondary | ICD-10-CM

## 2016-06-11 ENCOUNTER — Ambulatory Visit (INDEPENDENT_AMBULATORY_CARE_PROVIDER_SITE_OTHER): Payer: BLUE CROSS/BLUE SHIELD | Admitting: *Deleted

## 2016-06-11 DIAGNOSIS — J309 Allergic rhinitis, unspecified: Secondary | ICD-10-CM | POA: Diagnosis not present

## 2016-06-18 ENCOUNTER — Ambulatory Visit (INDEPENDENT_AMBULATORY_CARE_PROVIDER_SITE_OTHER): Payer: BLUE CROSS/BLUE SHIELD

## 2016-06-18 DIAGNOSIS — J309 Allergic rhinitis, unspecified: Secondary | ICD-10-CM | POA: Diagnosis not present

## 2016-06-25 ENCOUNTER — Ambulatory Visit (INDEPENDENT_AMBULATORY_CARE_PROVIDER_SITE_OTHER): Payer: BLUE CROSS/BLUE SHIELD

## 2016-06-25 DIAGNOSIS — J309 Allergic rhinitis, unspecified: Secondary | ICD-10-CM | POA: Diagnosis not present

## 2016-07-09 ENCOUNTER — Ambulatory Visit (INDEPENDENT_AMBULATORY_CARE_PROVIDER_SITE_OTHER): Payer: BLUE CROSS/BLUE SHIELD | Admitting: *Deleted

## 2016-07-09 DIAGNOSIS — J309 Allergic rhinitis, unspecified: Secondary | ICD-10-CM

## 2016-08-20 ENCOUNTER — Ambulatory Visit (INDEPENDENT_AMBULATORY_CARE_PROVIDER_SITE_OTHER): Payer: BLUE CROSS/BLUE SHIELD | Admitting: *Deleted

## 2016-08-20 DIAGNOSIS — J309 Allergic rhinitis, unspecified: Secondary | ICD-10-CM | POA: Diagnosis not present

## 2016-09-03 ENCOUNTER — Ambulatory Visit (INDEPENDENT_AMBULATORY_CARE_PROVIDER_SITE_OTHER): Payer: BLUE CROSS/BLUE SHIELD | Admitting: *Deleted

## 2016-09-03 DIAGNOSIS — J309 Allergic rhinitis, unspecified: Secondary | ICD-10-CM

## 2016-09-17 ENCOUNTER — Ambulatory Visit (INDEPENDENT_AMBULATORY_CARE_PROVIDER_SITE_OTHER): Payer: BLUE CROSS/BLUE SHIELD | Admitting: *Deleted

## 2016-09-17 DIAGNOSIS — J309 Allergic rhinitis, unspecified: Secondary | ICD-10-CM

## 2016-09-25 ENCOUNTER — Other Ambulatory Visit: Payer: Self-pay

## 2016-09-25 MED ORDER — ALBUTEROL SULFATE HFA 108 (90 BASE) MCG/ACT IN AERS: 2.0000 | INHALATION_SPRAY | RESPIRATORY_TRACT | 0 refills | Status: DC | PRN

## 2016-10-01 ENCOUNTER — Encounter: Payer: Self-pay | Admitting: Allergy & Immunology

## 2016-10-01 ENCOUNTER — Ambulatory Visit (INDEPENDENT_AMBULATORY_CARE_PROVIDER_SITE_OTHER): Payer: BLUE CROSS/BLUE SHIELD | Admitting: Allergy & Immunology

## 2016-10-01 VITALS — BP 122/72 | HR 97 | Temp 97.5°F | Ht 69.0 in | Wt 203.4 lb

## 2016-10-01 DIAGNOSIS — J4521 Mild intermittent asthma with (acute) exacerbation: Secondary | ICD-10-CM

## 2016-10-01 DIAGNOSIS — J3089 Other allergic rhinitis: Secondary | ICD-10-CM | POA: Diagnosis not present

## 2016-10-01 DIAGNOSIS — J0100 Acute maxillary sinusitis, unspecified: Secondary | ICD-10-CM | POA: Diagnosis not present

## 2016-10-01 MED ORDER — CLARITHROMYCIN 500 MG PO TABS: 500.0000 mg | ORAL_TABLET | Freq: Two times a day (BID) | ORAL | 0 refills | Status: AC

## 2016-10-01 NOTE — Patient Instructions (Addendum)
1. Chronic allergic rhinitis - Continue with allergy shots.  - I will look through your chart to see if there is anything else we need to do with regards to your shots since you are not feeling better on them.   2. Acute non-recurrent maxillary sinusitis - Start clarithromycin 500mg  twice daily for 21 days to clear up your sinus infection. - Continue with all of your nasal sprays as well as your antihistamines.  3. Mild intermittent asthma with acute exacerbation - Lung function was normal today, however since you did have wheezing we will treat you with a burst of steroids. - Take prednisone 60mg  daily for five days (six pills daily).  4. Return in about 2 months (around 12/01/2016).  Please inform us of any Emergency Department visits, hospitalizations, or changes in symptoms. Call us before going to the ED for breathing or allergy symptoms since we might be able to fit you in for a sick visit. Feel free to contact us anytime with any questions, problems, or concerns.  It was a pleasure to meet you today! Good luck with the singing this weekend!   Websites that have reliable patient information: 1. American Academy of Asthma, Allergy, and Immunology: www.aaaai.org 2. Food Allergy Research and Education (FARE): foodallergy.org 3. Mothers of Asthmatics: http://www.asthmacommunitynetwork.org 4. American College of Allergy, Asthma, and Immunology: www.acaai.org

## 2016-10-01 NOTE — Progress Notes (Signed)
FOLLOW UP  Date of Service/Encounter:  10/01/16   Assessment:   Chronic nonseasonal allergic rhinitis due to pollen  Acute non-recurrent maxillary sinusitis  Mild intermittent asthma with acute exacerbation   Asthma Reportables:  Severity: intermittent  Risk: low Control: well controlled  Seasonal Influenza Vaccine: yes     Plan/Recommendations:   1. Chronic allergic rhinitis - Continue with allergy shots.  - I will plan to review her chart to look at when the testing was performed and how long she has been on allergy shots. - We may consider sending a sIgE environmental allergy panel to evaluate for any additional allergies that may have emerged. - At that point, we may consider re-mixing her vials to see if we can more efficaciously deliver her allergy shots. - Molds are high protease, and therefore should not be combined with pollens and animal danders.  - The increased breakdown of the pollens and animal danders might explain why she is having a subpar result from her allergy immunotherapy.   2. Acute non-recurrent maxillary sinusitis - Start clarithromycin 500mg  twice daily for 21 days to clear up your sinus infection. - Continue with all of your nasal sprays as well as your antihistamines.  3. Mild intermittent asthma with acute exacerbation - Lung function was normal today, however since you did have wheezing we will treat you with a burst of steroids. - Take prednisone 60mg  daily for five days (six pills daily).  4. Return in about 2 months (around 12/01/2016).    Subjective:   Loel RoVivian H Kilfoyle is a 60 y.o. female presenting today for follow up of  Chief Complaint  Patient presents with  . Cough  . Sinus Problem  .  Loel RoVivian H Searing has a history of the following: Patient Active Problem List   Diagnosis Date Noted  . Allergic rhinoconjunctivitis 07/27/2015  . Cough     History obtained from: chart review and patient.  Loel RoVivian H Hartzog was  referred by Eartha InchBADGER,MICHAEL C, MD.     Maureen RalphsVivian is a 60 y.o. female presenting for a sick visit. The patient was last seen in May 2017 by Dr. Willa RoughHicks, who has since left the practice. At that time, she was continued on Flonase, Singulair, Pazeo, and Xyzal. She is on immunotherapy, which has been ongoing for several years (unsure of the exact date since we do not have her paper chart with us today). She was also given Astelin 1-2 sprays per nostril twice a day as needed for worsening allergy symptoms. Currently, she is on the red vial 0.685mL every two weeks. One vial contains weeds, dog, dust mite, and mold. The other vial contains grass and tree.  Since the last visit, she has not done well. Her summer was fairly uneventful from an allergy perspective. However, today she is complaining about a cough and sinus drainage that has persisted for around two months in total. In September sometime, she was in the Urgent Care and she was given a Z-pack and prednisone. Her symptoms did improve transiently after this treatment. However, throughout the fall season she has had allergy exacerbations when she goes outside to do raking and whatnot. These will clear over time as long as she stays inside, however she has been altering her activity such that she avoids the outdoors completely. She remains on her medications including Xyzal 5mg  daily as well as the Flonase two sprays per nostril once daily. She does have Astelin prescribed but has not been using it. She seems  to only remember that she has it when I mention it to her today during her visit.   Over the fall season, she has continued to have nasal rhinorrhea and congestion as well as bilateral sinus pressure.  She went to the drug store on Monday and got a Delsym which does not do much for her cough at all. Aside from the course of azithromycin in September, she has had no antibiotics. She does endorse wheezing but the ProAir does not seem to be helping.   Ms. Earna Coderuttle  has been on allergy immunotherapy for quite a long time, although she is unable to remember exactly when she has started. Although she did feel some improvement in her symptoms initially, she has not been doing quite as well in the last year or so. She does not feel that she has been able to get off of her medications as she anticipated would be the case. She continues to have problems that are noted most prominently in the spring time, although this year her symptoms flared in the fall which is not typical for her. ypically she has problems in the spring.   Otherwise, there have been no changes to her past medical history, surgical history, family history, or social history.    Review of Systems: a 14-point review of systems is pertinent for what is mentioned in HPI.  Otherwise, all other systems were negative. Constitutional: negative other than that listed in the HPI Eyes: negative other than that listed in the HPI Ears, nose, mouth, throat, and face: negative other than that listed in the HPI Respiratory: negative other than that listed in the HPI Cardiovascular: negative other than that listed in the HPI Gastrointestinal: negative other than that listed in the HPI Genitourinary: negative other than that listed in the HPI Integument: negative other than that listed in the HPI Hematologic: negative other than that listed in the HPI Musculoskeletal: negative other than that listed in the HPI Neurological: negative other than that listed in the HPI Allergy/Immunologic: negative other than that listed in the HPI    Objective:   Blood pressure 122/72, pulse 97, temperature 97.5 F (36.4 C), temperature source Oral, height 5\' 9"  (1.753 m), weight 203 lb 6.4 oz (92.3 kg), SpO2 95 %. Body mass index is 30.04 kg/m.   Physical Exam:  General: Alert, interactive, in no acute distress. Cooperative with the exam. Very pleasant and talkative. Hacking cough throughout.  HEENT: TMs pearly gray,  turbinates edematous and pale with clear discharge, post-pharynx markedly erythematous. Neck: Supple without thyromegaly. Lungs: Decreased breath sounds bilaterally without wheezing, rhonchi or rales. Increased work of breathing. CV: Normal S1/S2, no murmurs. Capillary refill <2 seconds.  Abdomen: Nondistended, nontender. No guarding or rebound tenderness. Bowel sounds faint and present in all fields  Skin: Warm and dry, without lesions or rashes. Extremities:  No clubbing, cyanosis or edema. Neuro:   Grossly intact.  Diagnostic studies:  Spirometry: results normal (FEV1: 2.69/97%, FVC: 3.14/96%, FEV1/FVC: 85%).    Spirometry consistent with normal pattern.    Malachi BondsJoel Gallagher, MD FAAAAI [facility]

## 2016-10-08 ENCOUNTER — Ambulatory Visit (INDEPENDENT_AMBULATORY_CARE_PROVIDER_SITE_OTHER): Payer: BLUE CROSS/BLUE SHIELD | Admitting: *Deleted

## 2016-10-08 DIAGNOSIS — J309 Allergic rhinitis, unspecified: Secondary | ICD-10-CM

## 2016-10-08 NOTE — Addendum Note (Signed)
Addended by: Alfonse SpruceGALLAGHER, JOEL LOUIS on: 10/08/2016 11:40 AM   Modules accepted: Orders

## 2016-10-11 ENCOUNTER — Encounter: Payer: Self-pay | Admitting: Allergy & Immunology

## 2016-10-15 ENCOUNTER — Other Ambulatory Visit: Payer: Self-pay | Admitting: *Deleted

## 2016-10-15 ENCOUNTER — Telehealth: Payer: Self-pay | Admitting: *Deleted

## 2016-10-15 MED ORDER — DOXYCYCLINE HYCLATE 100 MG PO CAPS: 100.0000 mg | ORAL_CAPSULE | Freq: Two times a day (BID) | ORAL | 0 refills | Status: DC

## 2016-10-15 NOTE — Telephone Encounter (Signed)
Patient came to office for allergy injection and to inform us that she never was able to get remaining [facility] from [facility].  Pt never completed antibiotics.  Pt has been around sick grandson and is now complaining of upper respiratory congestion and increased nasal congestion.  Pt does have increased sinus pressure.  Per Dr.Gallagher-pt to take Doxycycline 100 mg bid for 14 days.  [facility] [place] per pt request.  Pt will call us if there is a problem with [facility] getting full amount of antibiotic.

## 2016-11-05 ENCOUNTER — Ambulatory Visit (INDEPENDENT_AMBULATORY_CARE_PROVIDER_SITE_OTHER): Payer: BLUE CROSS/BLUE SHIELD | Admitting: *Deleted

## 2016-11-05 DIAGNOSIS — J309 Allergic rhinitis, unspecified: Secondary | ICD-10-CM

## 2016-11-07 DIAGNOSIS — J301 Allergic rhinitis due to pollen: Secondary | ICD-10-CM | POA: Diagnosis not present

## 2016-11-08 DIAGNOSIS — J3089 Other allergic rhinitis: Secondary | ICD-10-CM | POA: Diagnosis not present

## 2016-11-12 ENCOUNTER — Ambulatory Visit (INDEPENDENT_AMBULATORY_CARE_PROVIDER_SITE_OTHER): Payer: BLUE CROSS/BLUE SHIELD | Admitting: *Deleted

## 2016-11-12 DIAGNOSIS — J309 Allergic rhinitis, unspecified: Secondary | ICD-10-CM

## 2016-11-13 NOTE — Addendum Note (Signed)
Addended by: Berna BueWHITAKER, CARRIE L on: 11/13/2016 12:01 PM   Modules accepted: Orders

## 2016-11-26 ENCOUNTER — Ambulatory Visit (INDEPENDENT_AMBULATORY_CARE_PROVIDER_SITE_OTHER): Payer: BLUE CROSS/BLUE SHIELD | Admitting: *Deleted

## 2016-11-26 DIAGNOSIS — J309 Allergic rhinitis, unspecified: Secondary | ICD-10-CM

## 2016-12-10 ENCOUNTER — Ambulatory Visit (INDEPENDENT_AMBULATORY_CARE_PROVIDER_SITE_OTHER): Payer: BLUE CROSS/BLUE SHIELD | Admitting: *Deleted

## 2016-12-10 DIAGNOSIS — J309 Allergic rhinitis, unspecified: Secondary | ICD-10-CM

## 2016-12-17 ENCOUNTER — Ambulatory Visit (INDEPENDENT_AMBULATORY_CARE_PROVIDER_SITE_OTHER): Payer: BLUE CROSS/BLUE SHIELD | Admitting: *Deleted

## 2016-12-17 DIAGNOSIS — J309 Allergic rhinitis, unspecified: Secondary | ICD-10-CM | POA: Diagnosis not present

## 2016-12-24 ENCOUNTER — Ambulatory Visit (INDEPENDENT_AMBULATORY_CARE_PROVIDER_SITE_OTHER): Payer: BLUE CROSS/BLUE SHIELD | Admitting: *Deleted

## 2016-12-24 DIAGNOSIS — J309 Allergic rhinitis, unspecified: Secondary | ICD-10-CM

## 2017-01-21 ENCOUNTER — Ambulatory Visit (INDEPENDENT_AMBULATORY_CARE_PROVIDER_SITE_OTHER): Payer: BLUE CROSS/BLUE SHIELD | Admitting: *Deleted

## 2017-01-21 DIAGNOSIS — J309 Allergic rhinitis, unspecified: Secondary | ICD-10-CM

## 2017-02-04 ENCOUNTER — Ambulatory Visit (INDEPENDENT_AMBULATORY_CARE_PROVIDER_SITE_OTHER): Payer: BLUE CROSS/BLUE SHIELD | Admitting: *Deleted

## 2017-02-04 DIAGNOSIS — J309 Allergic rhinitis, unspecified: Secondary | ICD-10-CM

## 2017-02-11 ENCOUNTER — Ambulatory Visit (INDEPENDENT_AMBULATORY_CARE_PROVIDER_SITE_OTHER): Payer: BLUE CROSS/BLUE SHIELD | Admitting: *Deleted

## 2017-02-11 DIAGNOSIS — J309 Allergic rhinitis, unspecified: Secondary | ICD-10-CM

## 2017-02-25 ENCOUNTER — Ambulatory Visit (INDEPENDENT_AMBULATORY_CARE_PROVIDER_SITE_OTHER): Payer: BLUE CROSS/BLUE SHIELD | Admitting: *Deleted

## 2017-02-25 DIAGNOSIS — J3089 Other allergic rhinitis: Secondary | ICD-10-CM | POA: Diagnosis not present

## 2017-03-05 ENCOUNTER — Other Ambulatory Visit: Payer: Self-pay

## 2017-03-05 MED ORDER — OLOPATADINE HCL 0.7 % OP SOLN: 1.0000 [drp] | Freq: Every day | OPHTHALMIC | 0 refills | Status: DC

## 2017-03-05 NOTE — Telephone Encounter (Signed)
Received a fax from [facility] in regards to a refill for Pazeo. I sent 1 refill with no extra refills. Patient was last seen 10/01/16, and was asked to follow up in 2 months. Patient needs office visit for further refills.

## 2017-03-11 ENCOUNTER — Ambulatory Visit (INDEPENDENT_AMBULATORY_CARE_PROVIDER_SITE_OTHER): Payer: BLUE CROSS/BLUE SHIELD | Admitting: *Deleted

## 2017-03-11 DIAGNOSIS — J3089 Other allergic rhinitis: Secondary | ICD-10-CM | POA: Diagnosis not present

## 2017-03-18 ENCOUNTER — Ambulatory Visit (INDEPENDENT_AMBULATORY_CARE_PROVIDER_SITE_OTHER): Payer: BLUE CROSS/BLUE SHIELD | Admitting: *Deleted

## 2017-03-18 DIAGNOSIS — J3089 Other allergic rhinitis: Secondary | ICD-10-CM

## 2017-03-25 ENCOUNTER — Ambulatory Visit (INDEPENDENT_AMBULATORY_CARE_PROVIDER_SITE_OTHER): Payer: BLUE CROSS/BLUE SHIELD | Admitting: *Deleted

## 2017-03-25 DIAGNOSIS — J3089 Other allergic rhinitis: Secondary | ICD-10-CM | POA: Diagnosis not present

## 2017-04-15 ENCOUNTER — Ambulatory Visit (INDEPENDENT_AMBULATORY_CARE_PROVIDER_SITE_OTHER): Payer: BLUE CROSS/BLUE SHIELD | Admitting: *Deleted

## 2017-04-15 DIAGNOSIS — J3089 Other allergic rhinitis: Secondary | ICD-10-CM

## 2017-04-22 ENCOUNTER — Ambulatory Visit (INDEPENDENT_AMBULATORY_CARE_PROVIDER_SITE_OTHER): Payer: BLUE CROSS/BLUE SHIELD | Admitting: *Deleted

## 2017-04-22 DIAGNOSIS — J3089 Other allergic rhinitis: Secondary | ICD-10-CM | POA: Diagnosis not present

## 2017-04-29 ENCOUNTER — Ambulatory Visit (INDEPENDENT_AMBULATORY_CARE_PROVIDER_SITE_OTHER): Payer: BLUE CROSS/BLUE SHIELD | Admitting: *Deleted

## 2017-04-29 DIAGNOSIS — J3089 Other allergic rhinitis: Secondary | ICD-10-CM

## 2017-05-03 ENCOUNTER — Other Ambulatory Visit: Payer: Self-pay | Admitting: Allergy & Immunology

## 2017-05-05 NOTE — Telephone Encounter (Signed)
RF on Pazeo x 3

## 2017-05-13 ENCOUNTER — Ambulatory Visit (INDEPENDENT_AMBULATORY_CARE_PROVIDER_SITE_OTHER): Payer: BLUE CROSS/BLUE SHIELD | Admitting: *Deleted

## 2017-05-13 DIAGNOSIS — J3089 Other allergic rhinitis: Secondary | ICD-10-CM

## 2017-05-20 ENCOUNTER — Ambulatory Visit (INDEPENDENT_AMBULATORY_CARE_PROVIDER_SITE_OTHER): Payer: BLUE CROSS/BLUE SHIELD | Admitting: *Deleted

## 2017-05-20 DIAGNOSIS — J3089 Other allergic rhinitis: Secondary | ICD-10-CM | POA: Diagnosis not present

## 2017-05-27 ENCOUNTER — Ambulatory Visit (INDEPENDENT_AMBULATORY_CARE_PROVIDER_SITE_OTHER): Payer: BLUE CROSS/BLUE SHIELD | Admitting: *Deleted

## 2017-05-27 DIAGNOSIS — J3089 Other allergic rhinitis: Secondary | ICD-10-CM | POA: Diagnosis not present

## 2017-06-10 ENCOUNTER — Ambulatory Visit (INDEPENDENT_AMBULATORY_CARE_PROVIDER_SITE_OTHER): Payer: BLUE CROSS/BLUE SHIELD | Admitting: *Deleted

## 2017-06-10 DIAGNOSIS — J3089 Other allergic rhinitis: Secondary | ICD-10-CM | POA: Diagnosis not present

## 2017-06-23 DIAGNOSIS — J301 Allergic rhinitis due to pollen: Secondary | ICD-10-CM | POA: Diagnosis not present

## 2017-06-23 NOTE — Progress Notes (Signed)
VIALS EXP 06-24-18 

## 2017-06-24 ENCOUNTER — Ambulatory Visit (INDEPENDENT_AMBULATORY_CARE_PROVIDER_SITE_OTHER): Payer: BLUE CROSS/BLUE SHIELD | Admitting: *Deleted

## 2017-06-24 DIAGNOSIS — J3089 Other allergic rhinitis: Secondary | ICD-10-CM | POA: Diagnosis not present

## 2017-07-08 ENCOUNTER — Ambulatory Visit (INDEPENDENT_AMBULATORY_CARE_PROVIDER_SITE_OTHER): Payer: BLUE CROSS/BLUE SHIELD | Admitting: *Deleted

## 2017-07-08 DIAGNOSIS — J3089 Other allergic rhinitis: Secondary | ICD-10-CM

## 2017-07-22 ENCOUNTER — Ambulatory Visit (INDEPENDENT_AMBULATORY_CARE_PROVIDER_SITE_OTHER): Payer: BLUE CROSS/BLUE SHIELD | Admitting: *Deleted

## 2017-07-22 DIAGNOSIS — J3089 Other allergic rhinitis: Secondary | ICD-10-CM

## 2017-07-29 ENCOUNTER — Encounter (INDEPENDENT_AMBULATORY_CARE_PROVIDER_SITE_OTHER): Payer: Self-pay

## 2017-07-29 ENCOUNTER — Encounter: Payer: Self-pay | Admitting: Neurology

## 2017-07-29 ENCOUNTER — Ambulatory Visit (INDEPENDENT_AMBULATORY_CARE_PROVIDER_SITE_OTHER): Payer: BLUE CROSS/BLUE SHIELD | Admitting: Neurology

## 2017-07-29 VITALS — BP 130/92 | HR 83 | Ht 69.0 in | Wt 206.0 lb

## 2017-07-29 DIAGNOSIS — R0681 Apnea, not elsewhere classified: Secondary | ICD-10-CM

## 2017-07-29 DIAGNOSIS — E669 Obesity, unspecified: Secondary | ICD-10-CM | POA: Diagnosis not present

## 2017-07-29 DIAGNOSIS — G4734 Idiopathic sleep related nonobstructive alveolar hypoventilation: Secondary | ICD-10-CM | POA: Diagnosis not present

## 2017-07-29 DIAGNOSIS — R0683 Snoring: Secondary | ICD-10-CM | POA: Diagnosis not present

## 2017-07-29 DIAGNOSIS — R51 Headache: Secondary | ICD-10-CM | POA: Diagnosis not present

## 2017-07-29 DIAGNOSIS — R351 Nocturia: Secondary | ICD-10-CM | POA: Diagnosis not present

## 2017-07-29 DIAGNOSIS — R519 Headache, unspecified: Secondary | ICD-10-CM

## 2017-07-29 NOTE — Patient Instructions (Signed)

## 2017-07-29 NOTE — Progress Notes (Signed)
Subjective:    Patient ID: Brooke Farrell is a 61 y.o. female.  HPI     Brooke Foley, Brooke Farrell, Brooke Farrell [facility] [address] [address] [place], [place] [address]  Dear Dr. Cyndia Bent,   I saw your patient, Brooke Farrell, upon your kind request, in my neurologic clinic today for initial consultation of her sleep disorder, in particular, concern for underlying obstructive sleep apnea. The patient is unaccompanied today. As you know, Brooke Farrell is a 61 year old right-handed woman with an underlying medical history of hypertension, reflux disease, IBS with diarrhea, anxiety, depression, history of kidney stone, depression, allergic rhinitis, and obesity, who reports snoring and daytime somnolence as well as witnessed oxygen desaturations during her colonoscopy recently on 06/26/2017. She has a sister with sleep apnea and her husband has also noticed pauses in her breathing while asleep. I reviewed your office note from 02/10/2017, which you kindly included. Her Epworth sleepiness score is 11 out of 24 today, fatigue score is 49 out of 63. She is retired, worked for a [employer] for over 30 years, she lives with her husband. They have 1 son who lives across the street from them with his family. She is a nonsmoker and does not currently utilize alcohol and does not use illicit drugs, drinks caffeine in the form of coffee, 1-1/2 cups per day and cola once a day.  She has had non-restorative sleep for about 2 years, and has had difficulty with sleep onset and sleep maintentance for about 2-3 years, has been on Ambien for about this time. She has an elderly, 62 yo in a NH.  She has a sister and 2 brother, she has one son.  She has had some AM HAs, nocturia, about 1-2/night. She goes to bed around 9PM, and WT is around 5:15 AM and takes her GS to school. She denies telltale restless leg symptoms, sometimes her right hip bothers her.  Her Past Medical History Is Significant For: No  past medical history on file.  Her Past Surgical History Is Significant For: No past surgical history on file.  Her Family History Is Significant For: Family History  Problem Relation Age of Onset  . Allergic rhinitis Neg Hx   . Angioedema Neg Hx   . Asthma Neg Hx   . Atopy Neg Hx   . Eczema Neg Hx   . Immunodeficiency Neg Hx   . Urticaria Neg Hx     Her Social History Is Significant For: Social History   Social History  . Marital status: Married    Spouse name: N/A  . Number of children: N/A  . Years of education: N/A   Social History Main Topics  . Smoking status: Never Smoker  . Smokeless tobacco: Never Used  . Alcohol use No  . Drug use: No  . Sexual activity: Not Asked   Other Topics Concern  . None   Social History Narrative  . None    Her Allergies Are:  Allergies  Allergen Reactions  . Prednisone Other (See Comments)    Patient reports having very bad mood swings and was very angry when on this drug  . Robitussin (Alcohol Free)  [Guaifenesin] Swelling  . Sulfa Antibiotics Swelling  . Amoxicillin Diarrhea  :   Her Current Medications Are:  Outpatient Encounter Prescriptions as of 07/29/2017  Medication Sig  . albuterol (PROAIR HFA) 108 (90 Base) MCG/ACT inhaler Inhale 2 puffs into the lungs every 4 (four) hours as needed for  wheezing or shortness of breath.  Marland Kitchen azelastine (ASTELIN) 0.1 % nasal spray Place 1 spray into both nostrils daily. Use in each nostril as directed  . cetirizine (ZYRTEC) 10 MG tablet Take 10 mg by mouth daily as needed.   Marland Kitchen Dexlansoprazole 30 MG capsule Take 30 mg by mouth daily as needed.  . Eluxadoline (VIBERZI) 75 MG TABS Take 75 mg by mouth daily.  Marland Kitchen EPINEPHrine (EPIPEN 2-PAK) 0.3 mg/0.3 mL IJ SOAJ injection Inject 0.3 mLs (0.3 mg total) into the muscle as needed (for severe life-threatening allergic reaction).  Marland Kitchen FLUoxetine (PROZAC) 10 MG capsule Take 20 mg by mouth daily.   . fluticasone (FLONASE) 50 MCG/ACT nasal spray  Place 1 spray into both nostrils daily.  Marland Kitchen levocetirizine (XYZAL) 5 MG tablet Take 1 tablet (5 mg total) by mouth daily as needed. Reported on 03/05/2016  . montelukast (SINGULAIR) 10 MG tablet Take 1 tablet (10 mg total) by mouth daily as needed.  . zolpidem (AMBIEN) 10 MG tablet Take 1 tablet by mouth as needed.  . [DISCONTINUED] colestipol (COLESTID) 1 g tablet Take 1 g by mouth daily.  . [DISCONTINUED] doxycycline (VIBRAMYCIN) 100 MG capsule Take 1 capsule (100 mg total) by mouth 2 (two) times daily.  . [DISCONTINUED] lisinopril (PRINIVIL,ZESTRIL) 5 MG tablet Take 1 tablet by mouth daily.  . [DISCONTINUED] PAZEO 0.7 % SOLN APPLY 1 DROP TO EYE DAILY.   No facility-administered encounter medications on file as of 07/29/2017.   :  Review of Systems:  Out of a complete 14 point review of systems, all are reviewed and negative with the exception of these symptoms as listed below: Review of Systems  Neurological:       Pt presents today to discuss her sleep. Pt feels tired all the time. Pt has never had a sleep study but does endorse snoring.  Epworth Sleepiness Scale 0= would never doze 1= slight chance of dozing 2= moderate chance of dozing 3= high chance of dozing  Sitting and reading: 2 Watching TV: 2 Sitting inactive in a public place (ex. Theater or meeting): 2 As a passenger in a car for an hour without a break: 0 Lying down to rest in the afternoon: 2 Sitting and talking to someone: 0 Sitting quietly after lunch (no alcohol): 3 In a car, while stopped in traffic: 0 Total: 11     Objective:  Neurological Exam  Physical Exam Physical Examination:   Vitals:   07/29/17 1530  BP: (!) 130/92  Pulse: 83    General Examination: The patient is a very pleasant 61 y.o. female in no acute distress. She appears well-developed and well-nourished and well groomed.   HEENT: Normocephalic, atraumatic, pupils are equal, round and reactive to light and accommodation. Extraocular  tracking is good without limitation to gaze excursion or nystagmus noted. Normal smooth pursuit is noted. Hearing is grossly intact. Face is symmetric with normal facial animation and normal facial sensation. Speech is clear with no dysarthria noted. There is no hypophonia. There is no lip, neck/head, jaw or voice tremor. Neck is supple with full range of passive and active motion. There are no carotid bruits on auscultation. Oropharynx exam reveals: mild mouth dryness, adequate dental hygiene and moderate airway crowding, due to thicker soft palate, tonsils of 2+. Mallampati is class II. Tongue protrudes centrally and palate elevates symmetrically. Tonsils are 2+ in size/absent. Neck size is 15.5 inches. She has a Mild overbite.   Chest: Clear to auscultation without wheezing, rhonchi or crackles  noted.  Heart: S1+S2+0, regular and normal without murmurs, rubs or gallops noted.   Abdomen: Soft, non-tender and non-distended with normal bowel sounds appreciated on auscultation.  Extremities: There is no pitting edema in the distal lower extremities bilaterally. Pedal pulses are intact.  Skin: Warm and dry without trophic changes noted. There are no varicose veins.  Musculoskeletal: exam reveals no obvious joint deformities, tenderness or joint swelling or erythema.   Neurologically:  Mental status: The patient is awake, alert and oriented in all 4 spheres. Her immediate and remote memory, attention, language skills and fund of knowledge are appropriate. There is no evidence of aphasia, agnosia, apraxia or anomia. Speech is clear with normal prosody and enunciation. Thought process is linear. Mood is normal and affect is normal.  Cranial nerves II - XII are as described above under HEENT exam. In addition: shoulder shrug is normal with equal shoulder height noted. Motor exam: Normal bulk, strength and tone is noted. There is no drift, tremor or rebound. Romberg is negative. Reflexes are 2+ throughout.  Fine motor skills and coordination: intact with normal finger taps, normal hand movements, normal rapid alternating patting, normal foot taps and normal foot agility.  Cerebellar testing: No dysmetria or intention tremor on finger to nose testing. Heel to shin is unremarkable bilaterally. There is no truncal or gait ataxia.  Sensory exam: intact to light touch in the upper and lower extremities.  Gait, station and balance: She stands easily. No veering to one side is noted. No leaning to one side is noted. Posture is age-appropriate and stance is narrow based. Gait shows normal stride length and normal pace. No problems turning are noted. Tandem walk is unremarkable.   Assessment and Plan:  In summary, Brooke Farrell is a very pleasant 61 y.o.-year old female with an underlying medical history of hypertension, reflux disease, IBS with diarrhea, anxiety, depression, history of kidney stone, depression, allergic rhinitis, and obesity, whose history and physical exam are concerning for obstructive sleep apnea (OSA). I had a long chat with the patient about my findings and the diagnosis of OSA, its prognosis and treatment options. We talked about medical treatments, surgical interventions and non-pharmacological approaches. I explained in particular the risks and ramifications of untreated moderate to severe OSA, especially with respect to developing cardiovascular disease down the Road, including congestive heart failure, difficult to treat hypertension, cardiac arrhythmias, or stroke. Even type 2 diabetes has, in part, been linked to untreated OSA. Symptoms of untreated OSA include daytime sleepiness, memory problems, mood irritability and mood disorder such as depression and anxiety, lack of energy, as well as recurrent headaches, especially morning headaches. We talked about trying to maintain a healthy lifestyle in general, as well as the importance of weight control. I encouraged the patient to eat  healthy, exercise daily and keep well hydrated, to keep a scheduled bedtime and wake time routine, to not skip any meals and eat healthy snacks in between meals. I advised the patient not to drive when feeling sleepy. I recommended the following at this time: sleep study with potential positive airway pressure titration. (We will score hypopneas at 3%).   I explained the sleep test procedure to the patient and also outlined possible surgical and non-surgical treatment options of OSA, including the use of a custom-made dental device (which would require a referral to a specialist dentist or oral surgeon), upper airway surgical options, such as pillar implants, radiofrequency surgery, tongue base surgery, and UPPP (which would involve a referral  to an ENT surgeon). Rarely, jaw surgery such as mandibular advancement may be considered.  I also explained the CPAP treatment option to the patient, who indicated that she would be willing to try CPAP if the need arises. I explained the importance of being compliant with PAP treatment, not only for insurance purposes but primarily to improve Her symptoms, and for the patient's long term health benefit, including to reduce Her cardiovascular risks. I answered all her questions today and the patient was in agreement. I would like to see her back after the sleep study is completed and encouraged her to call with any interim questions, concerns, problems or updates.   Thank you very much for allowing me to participate in the care of this nice patient. If I can be of any further assistance to you please do not hesitate to call me at [phone].  Sincerely,   Star Age, Brooke Farrell, Brooke Farrell

## 2017-08-05 ENCOUNTER — Ambulatory Visit (INDEPENDENT_AMBULATORY_CARE_PROVIDER_SITE_OTHER): Payer: BLUE CROSS/BLUE SHIELD | Admitting: *Deleted

## 2017-08-05 DIAGNOSIS — J3089 Other allergic rhinitis: Secondary | ICD-10-CM

## 2017-08-08 DIAGNOSIS — J301 Allergic rhinitis due to pollen: Secondary | ICD-10-CM | POA: Diagnosis not present

## 2017-08-26 ENCOUNTER — Ambulatory Visit (INDEPENDENT_AMBULATORY_CARE_PROVIDER_SITE_OTHER): Payer: BLUE CROSS/BLUE SHIELD

## 2017-08-26 DIAGNOSIS — J3089 Other allergic rhinitis: Secondary | ICD-10-CM

## 2017-08-29 ENCOUNTER — Ambulatory Visit (INDEPENDENT_AMBULATORY_CARE_PROVIDER_SITE_OTHER): Payer: BLUE CROSS/BLUE SHIELD | Admitting: Neurology

## 2017-08-29 DIAGNOSIS — G4739 Other sleep apnea: Secondary | ICD-10-CM

## 2017-08-29 DIAGNOSIS — G4733 Obstructive sleep apnea (adult) (pediatric): Secondary | ICD-10-CM | POA: Diagnosis not present

## 2017-08-29 DIAGNOSIS — R51 Headache: Secondary | ICD-10-CM

## 2017-08-29 DIAGNOSIS — G4720 Circadian rhythm sleep disorder, unspecified type: Secondary | ICD-10-CM

## 2017-08-29 DIAGNOSIS — G4734 Idiopathic sleep related nonobstructive alveolar hypoventilation: Secondary | ICD-10-CM

## 2017-08-29 DIAGNOSIS — R519 Headache, unspecified: Secondary | ICD-10-CM

## 2017-08-29 DIAGNOSIS — R0683 Snoring: Secondary | ICD-10-CM

## 2017-08-29 DIAGNOSIS — G4731 Primary central sleep apnea: Secondary | ICD-10-CM

## 2017-08-29 DIAGNOSIS — R351 Nocturia: Secondary | ICD-10-CM

## 2017-08-29 DIAGNOSIS — R0681 Apnea, not elsewhere classified: Secondary | ICD-10-CM

## 2017-08-29 DIAGNOSIS — E669 Obesity, unspecified: Secondary | ICD-10-CM

## 2017-09-01 ENCOUNTER — Telehealth: Payer: Self-pay

## 2017-09-01 NOTE — Telephone Encounter (Signed)
I called Brooke Farrell. I advised Brooke Farrell that Dr. Frances FurbishAthar reviewed their sleep study results and found that Brooke Farrell does have severe osa but did reasonably well with the cpap during the study with improvement of the respiratory events. Dr. Frances FurbishAthar recommends that Brooke Farrell start a cpap at home. I reviewed PAP compliance expectations with the Brooke Farrell. Brooke Farrell is agreeable to starting a CPAP. I advised Brooke Farrell that an order will be sent to a DME, [facility], and [facility] will call the Brooke Farrell within about one week after they file with the Brooke Farrell's insurance. [facility] will show the Brooke Farrell how to use the machine, fit for masks, and troubleshoot the CPAP if needed. A follow up appt was made for insurance purposes with Dr. Frances FurbishAthar on 11/12/17 at 11:30am. Brooke Farrell verbalized understanding to arrive 15 minutes early and bring their CPAP. A letter with all of this information in it will be mailed to the Brooke Farrell as a reminder. I verified with the Brooke Farrell that the address we have on file is correct. Brooke Farrell verbalized understanding of results. Brooke Farrell had no questions at this time but was encouraged to call back if questions arise.

## 2017-09-01 NOTE — Telephone Encounter (Signed)
-----   Message from Huston FoleySaima Athar, MD sent at 09/01/2017  8:22 AM EDT ----- Patient referred by Dr. Cyndia BentBadger, seen by me on 07/29/17, split night sleep study on 08/29/17. Please call and notify patient that the recent sleep study confirmed the diagnosis of severe OSA. She did reasonably well with CPAP during the study with improvement of the respiratory events. Therefore, I would like start the patient on CPAP therapy at home by prescribing a machine for home use. I placed the order in the chart.  Please advise patient that we need a follow up appointment with either myself or one of our nurse practitioners in about 10 weeks post set-up to check for how the patient is feeling and how well the patient is using the machine, etc. Please go ahead and schedule the appointment, while you have the patient on the phone and make sure patient understands the importance of keeping this window for the FU appointment, as it is often an insurance requirement. Failing to adhere to this may result in losing coverage for sleep apnea treatment, at which point most patients are left with a choice of returning the machine or paying out of pocket (and we want neither of this to happen!).  Please re-enforce the importance of compliance with treatment and the need for us to monitor compliance data - again an insurance requirement and usually a good feedback for the patient as far as how they are doing.  Also remind patient, that any PAP machine or mask issues should be first addressed with the DME company, who provided the machine/mask.  Please ask if patient has a preference regarding DME company, may depend on the insurance too.  Please arrange for CPAP set up at home through a DME company of patient's choice.  Once you have spoken to the patient you can close the phone encounter. Please fax/route report to referring provider, thanks,   Huston FoleySaima Athar, MD, PhD [facility]

## 2017-09-01 NOTE — Progress Notes (Signed)
Patient referred by Dr. Cyndia BentBadger, seen by me on 07/29/17, split night sleep study on 08/29/17. Please call and notify patient that the recent sleep study confirmed the diagnosis of severe OSA. She did reasonably well with CPAP during the study with improvement of the respiratory events. Therefore, I would like start the patient on CPAP therapy at home by prescribing a machine for home use. I placed the order in the chart.  Please advise patient that we need a follow up appointment with either myself or one of our nurse practitioners in about 10 weeks post set-up to check for how the patient is feeling and how well the patient is using the machine, etc. Please go ahead and schedule the appointment, while you have the patient on the phone and make sure patient understands the importance of keeping this window for the FU appointment, as it is often an insurance requirement. Failing to adhere to this may result in losing coverage for sleep apnea treatment, at which point most patients are left with a choice of returning the machine or paying out of pocket (and we want neither of this to happen!).  Please re-enforce the importance of compliance with treatment and the need for us to monitor compliance data - again an insurance requirement and usually a good feedback for the patient as far as how they are doing.  Also remind patient, that any PAP machine or mask issues should be first addressed with the DME company, who provided the machine/mask.  Please ask if patient has a preference regarding DME company, may depend on the insurance too.  Please arrange for CPAP set up at home through a DME company of patient's choice.  Once you have spoken to the patient you can close the phone encounter. Please fax/route report to referring provider, thanks,   Huston FoleySaima Earlisha Sharples, MD, PhD [facility])

## 2017-09-01 NOTE — Procedures (Signed)
PATIENT'S NAME:  Brooke Farrell, Brooke Farrell DOB:      10/15/1956      MR#:    [id number]     DATE OF RECORDING: 08/29/2017 REFERRING M.D.:  Antony Haste, MD Study Performed:  Split-Night Titration Study HISTORY: 61 year old woman with a history of hypertension, reflux disease, IBS with diarrhea, anxiety, depression, history of kidney stone, depression, allergic rhinitis, and obesity, who reports snoring and daytime somnolence as well as witnessed oxygen desaturations during her colonoscopy recently on 06/26/2017. The patient endorsed the Epworth Sleepiness Scale at 11 points. The patient's weight 205 pounds with a height of 69 (inches), resulting in a BMI of 30.4 kg/m2. The patient's neck circumference measured 15.5 inches.  CURRENT MEDICATIONS: Proair, Astelin, Zyrtec, Viberzi, Epipen, Prozac, Flonase, Xyzal, Singulair, Ambien  PROCEDURE:  This is a multichannel digital polysomnogram utilizing the Somnostar 11.2 system.  Electrodes and sensors were applied and monitored per AASM Specifications.   EEG, EOG, Chin and Limb EMG, were sampled at 200 Hz.  ECG, Snore and Nasal Pressure, Thermal Airflow, Respiratory Effort, CPAP Flow and Pressure, Oximetry was sampled at 50 Hz. Digital video and audio were recorded.      BASELINE STUDY WITHOUT CPAP RESULTS:  Lights Out was at 21:06 and Lights On at 05:09, split study start at 22:48, epoch 211. Total recording time (TRT) was 101.5, with a total sleep time (TST) of 84 minutes. The patient's sleep latency was normal, REM latency was 89.5 minutes.  The sleep efficiency was 82.8 %.    SLEEP ARCHITECTURE: WASO (Wake after sleep onset) was 10.5 minutes with moderate sleep fragmentation noted, Stage N1 was 23.5 minutes, Stage N2 was 47 minutes, Stage N3 was 9.5 minutes and Stage R (REM sleep) was 4 minutes.  The percentages were Stage N1 28.%, which is increased, Stage N2 56.%, which is high normal, Stage N3 11.3% and Stage R (REM sleep) 4.8%. The arousals were noted as: 7  were spontaneous, 0 were associated with PLMs, 76 were associated with respiratory events.   Audio and video analysis did not show any abnormal or unusual movements, behaviors, phonations or vocalizations. The patient took no bathroom breaks. Moderate snoring was noted. The EKG was in keeping with normal sinus rhythm (NSR).   RESPIRATORY ANALYSIS:  There were a total of 92 respiratory events:  30 obstructive apneas, 7 central apneas and 3 mixed apneas with a total of 40 apneas and an apnea index (AI) of 28.6. There were 52 hypopneas with a hypopnea index of 37.1. The patient also had 0 respiratory event related arousals (RERAs).  Snoring was noted.     The total APNEA/HYPOPNEA INDEX (AHI) was 65.7 /hour and the total RESPIRATORY DISTURBANCE INDEX was 65.7 /hour.  4 events occurred in REM sleep and 106 events in NREM. The REM AHI was 60, /hour versus a non-REM AHI of 66 /hour. The patient spent 132.5 minutes sleep time in the supine position 241 minutes in non-supine. The supine AHI was 110.4 /hour versus a non-supine AHI of 57.9 /hour.  OXYGEN SATURATION & C02:  The wake baseline 02 saturation was 92%, with the lowest being 75%. Time spent below 89% saturation equaled 67 minutes.  PERIODIC LIMB MOVEMENTS: The patient had a total of 0 Periodic Limb Movements. The Periodic Limb Movement (PLM) index was 0 /hour and the PLM Arousal index was 0 /hour.   TITRATION STUDY WITH CPAP RESULTS:   The patient was started on CPAP at a pressure of 5 cm and advanced to 10 cm  per AASM split night standards. She had some central respiratory events later and was briefly tried on BiPAP and BiPAP ST and pressure was advanced to 12/8 cmH20 because of hypopneas, apneas and desaturations. However, there was not enough time to adequately titrate BiPAP. At a PAP pressure of 10 cmH20 of CPAP, her AHI was 3.9/hour, with non-supine REM sleep achieved and O2 nadir of 84%.   Total recording time (TRT) was 381.5 minutes, with a  total sleep time (TST) of 289.5 minutes. The patient's sleep latency was 22 minutes. REM latency was 56 minutes.  The sleep efficiency was 75.9%.    SLEEP ARCHITECTURE: Wake after sleep was 69 minutes, Stage N1 26.5 minutes, Stage N2 134 minutes, Stage N3 69.5 minutes and Stage R (REM sleep) 59.5 minutes. The percentages were: Stage N1 9.2%, Stage N2 46.3%, Stage N3 24.% and Stage R (REM sleep) 20.6%.   The arousals were noted as: 26 were spontaneous, 0 were associated with PLMs, 16 were associated with respiratory events.  RESPIRATORY ANALYSIS:  There were a total of 29 respiratory events: 0 obstructive apneas, 27 central apneas and 0 mixed apneas with a total of 27 apneas and an apnea index (AI) of 5.6. There were 2 hypopneas with a hypopnea index of .4 /hour. The patient also had 0 respiratory event related arousals (RERAs).      The total APNEA/HYPOPNEA INDEX  (AHI) was 6. /hour and the total RESPIRATORY DISTURBANCE INDEX was 6. /hour.  3 events occurred in REM sleep and 26 events in NREM. The REM AHI was 3. /hour versus a non-REM AHI of 6.8 /hour. REM sleep was achieved on a pressure of  cm/h2o (AHI was  .) The patient spent 41% of total sleep time in the supine position. The supine AHI was 13.5 /hour, versus a non-supine AHI of 0.8/hour.  OXYGEN SATURATION & C02:  The wake baseline 02 saturation was 97%, with the lowest being 83%. Time spent below 89% saturation equaled 8 minutes.   PERIODIC LIMB MOVEMENTS: The patient had a total of 0 Periodic Limb Movements. The Periodic Limb Movement (PLM) index was 0 /hour and the PLM Arousal index was 0/hour.    Post-study, the patient indicated that sleep was worse than usual.  POLYSOMNOGRAPHY IMPRESSION:    1. Obstructive Sleep Apnea (OSA)  2. Dysfunctions associated with sleep stages or arousals from sleep 3.  Treatment emergent Central Apneas  RECOMMENDATIONS:  1. This patient has severe obstructive sleep apnea and responded reasonably well on  CPAP therapy; there were some central respiratory events later and O2 nadir on the final pressure of CPAP at 10 cm was 84%. I will start the patient on home CPAP treatment at a pressure of 10 cm via mask of choice, but will also do an overnight pulse oximetry test once established on CPAP at home. If she should have residual central sleep disordered breathing and/or significant residual desaturations, she may benefit from returning for a full night titration study with BiPAP or BiPAP ST. The patient should be reminded to be fully compliant with PAP therapy to improve sleep related symptoms and decrease long term cardiovascular risks. Please note that untreated obstructive sleep apnea carries additional perioperative morbidity. Patients with significant obstructive sleep apnea should receive perioperative PAP therapy and the surgeons and particularly the anesthesiologist should be informed of the diagnosis and the severity of the sleep disordered breathing. 2. This study shows sleep fragmentation and abnormal sleep stage percentages; these are nonspecific findings and per se  do not signify an intrinsic sleep disorder or a cause for the patient's sleep-related symptoms. Causes include (but are not limited to) the first night effect of the sleep study, circadian rhythm disturbances, medication effect or an underlying mood disorder or medical problem.  3. The patient should be cautioned not to drive, work at heights, or operate dangerous or heavy equipment when tired or sleepy. Review and reiteration of good sleep hygiene measures should be pursued with any patient. 4. The patient will be seen in follow-up by Dr. Frances Furbish at [facility] for discussion of the test results and further management strategies. The referring provider will be notified of the test results.  I certify that I have reviewed the entire raw data recording prior to the issuance of this report in accordance with the Standards of Accreditation of the  American Academy of Sleep Medicine (AASM)   Huston Foley, MD, PhD Diplomat, American Board of Psychiatry and Neurology (Neurology and Sleep Medicine)

## 2017-09-01 NOTE — Addendum Note (Signed)
Addended by: Huston FoleyATHAR, Cleopha Indelicato on: 09/01/2017 08:22 AM   Modules accepted: Orders

## 2017-09-01 NOTE — Telephone Encounter (Signed)
I called pt. She is at the dentist office and will call me back.

## 2017-09-02 ENCOUNTER — Ambulatory Visit (INDEPENDENT_AMBULATORY_CARE_PROVIDER_SITE_OTHER): Payer: BLUE CROSS/BLUE SHIELD | Admitting: *Deleted

## 2017-09-02 DIAGNOSIS — J309 Allergic rhinitis, unspecified: Secondary | ICD-10-CM | POA: Diagnosis not present

## 2017-09-09 ENCOUNTER — Ambulatory Visit (INDEPENDENT_AMBULATORY_CARE_PROVIDER_SITE_OTHER): Payer: BLUE CROSS/BLUE SHIELD | Admitting: *Deleted

## 2017-09-09 DIAGNOSIS — J309 Allergic rhinitis, unspecified: Secondary | ICD-10-CM | POA: Diagnosis not present

## 2017-09-16 NOTE — Telephone Encounter (Signed)
Pt called said she does not have the time now to go to [facility] to get CPAP until January. Pt said she has 4 other appt's, the holidays are upon her, she is taking care of her 62 yo mother and remodeling her kitchen. Aappt was c/a in January, she will call back when she gets CPAP to r/s.

## 2017-09-16 NOTE — Telephone Encounter (Signed)
Noted  

## 2017-09-23 ENCOUNTER — Ambulatory Visit (INDEPENDENT_AMBULATORY_CARE_PROVIDER_SITE_OTHER): Payer: BLUE CROSS/BLUE SHIELD

## 2017-09-23 DIAGNOSIS — J309 Allergic rhinitis, unspecified: Secondary | ICD-10-CM | POA: Diagnosis not present

## 2017-09-30 ENCOUNTER — Ambulatory Visit (INDEPENDENT_AMBULATORY_CARE_PROVIDER_SITE_OTHER): Payer: BLUE CROSS/BLUE SHIELD | Admitting: *Deleted

## 2017-09-30 DIAGNOSIS — J309 Allergic rhinitis, unspecified: Secondary | ICD-10-CM | POA: Diagnosis not present

## 2017-10-09 ENCOUNTER — Telehealth: Payer: Self-pay | Admitting: Neurology

## 2017-10-09 NOTE — Telephone Encounter (Signed)
Patient has called, she states that she has difficulty with being able to get in touch with someone at the [facility] [place]. The patient states that she is having to drive and stay with her mother who is in [place]. The patient would like [facility]  [place]. I have informed the patient that there is a company based out of [facility] in [place] also that may allow her to stay with [facility] but the patient is continue to inquire about changing to [facility]. Please call and advise of this process.

## 2017-10-09 NOTE — Telephone Encounter (Signed)
I called pt. She is with her mother in [place] 2 days of the week and wants to use [facility]. She has not started her cpap yet. I advised her that I would send this referral to [facility] and they should call her within a week. Pt was reminded to make her cpap follow up appt 31-90 days post cpap start. Pt verbalized understanding.

## 2017-10-14 ENCOUNTER — Encounter: Payer: BLUE CROSS/BLUE SHIELD | Admitting: Obstetrics & Gynecology

## 2017-10-17 NOTE — Telephone Encounter (Signed)
Pt has called to inform that just a few mins ago she spoke with [facility] and was told they have not received a referral.  Pt is asking that it be resent for her.  Pt is not asking for a call back

## 2017-10-17 NOTE — Telephone Encounter (Signed)
I called pt, advised her that I have resent the referral for the second time. If [facility] still did not receive the referral, they should call me directly. Pt verbalized understanding.

## 2017-10-21 ENCOUNTER — Ambulatory Visit (INDEPENDENT_AMBULATORY_CARE_PROVIDER_SITE_OTHER): Payer: BLUE CROSS/BLUE SHIELD

## 2017-10-21 DIAGNOSIS — J309 Allergic rhinitis, unspecified: Secondary | ICD-10-CM

## 2017-11-05 ENCOUNTER — Ambulatory Visit (INDEPENDENT_AMBULATORY_CARE_PROVIDER_SITE_OTHER): Payer: BLUE CROSS/BLUE SHIELD

## 2017-11-05 DIAGNOSIS — J309 Allergic rhinitis, unspecified: Secondary | ICD-10-CM | POA: Diagnosis not present

## 2017-11-12 ENCOUNTER — Ambulatory Visit: Payer: Self-pay | Admitting: Neurology

## 2017-11-18 ENCOUNTER — Ambulatory Visit (INDEPENDENT_AMBULATORY_CARE_PROVIDER_SITE_OTHER): Payer: BLUE CROSS/BLUE SHIELD

## 2017-11-18 DIAGNOSIS — J309 Allergic rhinitis, unspecified: Secondary | ICD-10-CM | POA: Diagnosis not present

## 2017-11-21 NOTE — Telephone Encounter (Addendum)
Pt called the pressure is too high. She is not happy with a full face mask, she is wanting to try a nasal mask. [facility] and told her she could not use nasal mask bc she breath thru her mouth. Please call to advise at [phone].

## 2017-11-25 DIAGNOSIS — K449 Diaphragmatic hernia without obstruction or gangrene: Secondary | ICD-10-CM | POA: Insufficient documentation

## 2017-11-26 ENCOUNTER — Telehealth: Payer: Self-pay | Admitting: Neurology

## 2017-11-26 DIAGNOSIS — G4733 Obstructive sleep apnea (adult) (pediatric): Secondary | ICD-10-CM

## 2017-11-26 NOTE — Telephone Encounter (Signed)
Pt is requesting a call back from RN to speak about her CPAP

## 2017-11-26 NOTE — Telephone Encounter (Signed)
I called pt. She reports that [facility] will not let her have a nasal mask, they are insisting she use a FFM since she is a mouth breather. They need an order from us to put the pt on a nasal mask. I explained that Dr. Teofilo PodAthar's order already gives pt permission to choose the mask of choice. I will send this in to the [facility] again. Pt will call me with any further questions or concerns.

## 2017-12-02 ENCOUNTER — Ambulatory Visit (INDEPENDENT_AMBULATORY_CARE_PROVIDER_SITE_OTHER): Payer: BLUE CROSS/BLUE SHIELD | Admitting: *Deleted

## 2017-12-02 DIAGNOSIS — J309 Allergic rhinitis, unspecified: Secondary | ICD-10-CM

## 2017-12-07 ENCOUNTER — Encounter: Payer: Self-pay | Admitting: Neurology

## 2017-12-30 ENCOUNTER — Ambulatory Visit (INDEPENDENT_AMBULATORY_CARE_PROVIDER_SITE_OTHER): Payer: BLUE CROSS/BLUE SHIELD

## 2017-12-30 DIAGNOSIS — J309 Allergic rhinitis, unspecified: Secondary | ICD-10-CM

## 2018-01-12 NOTE — Telephone Encounter (Signed)
Since she has severe OSA, I would not recommend, she leave her OSA untreated. We can send order to another DME of her choosing, which may or may not be possible, given, that she has been to [facility]? We can also look into referrals to ENT and/or Dentistry for alternative treatment options. She is encouraged to talk to her own dentist about getting an oral appliance made. If she is open to talking to an ENT surgeon about surgical options, I can make a referral. If her own dentist does not provide treatment for OSA, I can make a referral to a dentist here.  Please call pt back to advise of the above.

## 2018-01-12 NOTE — Telephone Encounter (Signed)
I called pt and explained Dr. Teofilo PodAthar's recommendations. Pt says that she would rather see an ENT and is asking for a referral to ENT in [place]. Pt says that the reason she stopped her cpap is because she has multiple other medical problems. She understands that her sleep apnea needs to be treated and the risks of untreated sleep apnea. Will place referral.

## 2018-01-12 NOTE — Addendum Note (Signed)
Addended by: Geronimo RunningINKINS, Jereld Presti A on: 01/12/2018 03:00 PM   Modules accepted: Orders

## 2018-01-12 NOTE — Telephone Encounter (Signed)
Received notice that pt has refused a cpap from [facility]. [facility] reported to [facility] that she understood the risks associated with this refusal.

## 2018-01-13 ENCOUNTER — Ambulatory Visit (INDEPENDENT_AMBULATORY_CARE_PROVIDER_SITE_OTHER): Payer: BLUE CROSS/BLUE SHIELD | Admitting: *Deleted

## 2018-01-13 DIAGNOSIS — J309 Allergic rhinitis, unspecified: Secondary | ICD-10-CM

## 2018-02-03 ENCOUNTER — Ambulatory Visit (INDEPENDENT_AMBULATORY_CARE_PROVIDER_SITE_OTHER): Payer: BLUE CROSS/BLUE SHIELD

## 2018-02-03 DIAGNOSIS — J309 Allergic rhinitis, unspecified: Secondary | ICD-10-CM

## 2018-02-10 ENCOUNTER — Ambulatory Visit (INDEPENDENT_AMBULATORY_CARE_PROVIDER_SITE_OTHER): Payer: BLUE CROSS/BLUE SHIELD | Admitting: *Deleted

## 2018-02-10 DIAGNOSIS — J309 Allergic rhinitis, unspecified: Secondary | ICD-10-CM

## 2018-02-19 ENCOUNTER — Ambulatory Visit: Payer: BLUE CROSS/BLUE SHIELD | Admitting: Allergy & Immunology

## 2018-03-02 DIAGNOSIS — K589 Irritable bowel syndrome without diarrhea: Secondary | ICD-10-CM | POA: Insufficient documentation

## 2018-03-03 ENCOUNTER — Ambulatory Visit (INDEPENDENT_AMBULATORY_CARE_PROVIDER_SITE_OTHER): Payer: BLUE CROSS/BLUE SHIELD

## 2018-03-03 DIAGNOSIS — J309 Allergic rhinitis, unspecified: Secondary | ICD-10-CM | POA: Diagnosis not present

## 2018-03-10 ENCOUNTER — Ambulatory Visit (INDEPENDENT_AMBULATORY_CARE_PROVIDER_SITE_OTHER): Payer: BLUE CROSS/BLUE SHIELD | Admitting: *Deleted

## 2018-03-10 DIAGNOSIS — J309 Allergic rhinitis, unspecified: Secondary | ICD-10-CM | POA: Diagnosis not present

## 2018-03-24 ENCOUNTER — Ambulatory Visit (INDEPENDENT_AMBULATORY_CARE_PROVIDER_SITE_OTHER): Payer: BLUE CROSS/BLUE SHIELD

## 2018-03-24 DIAGNOSIS — J309 Allergic rhinitis, unspecified: Secondary | ICD-10-CM

## 2018-03-31 ENCOUNTER — Ambulatory Visit (INDEPENDENT_AMBULATORY_CARE_PROVIDER_SITE_OTHER): Payer: BLUE CROSS/BLUE SHIELD | Admitting: *Deleted

## 2018-03-31 DIAGNOSIS — J309 Allergic rhinitis, unspecified: Secondary | ICD-10-CM | POA: Diagnosis not present

## 2018-04-14 ENCOUNTER — Ambulatory Visit (INDEPENDENT_AMBULATORY_CARE_PROVIDER_SITE_OTHER): Payer: BLUE CROSS/BLUE SHIELD

## 2018-04-14 DIAGNOSIS — J309 Allergic rhinitis, unspecified: Secondary | ICD-10-CM | POA: Diagnosis not present

## 2018-04-21 NOTE — Telephone Encounter (Signed)
Pt returned my call. I had an extended conversation with her. She reports that she could not tolerate the cpap because it was blowing "too hard" and causing "my nose to flap". She reports that [facility] regarding a [facility] ref[facility], but she reports [facility] was unhelpful and so pt ended up turning her cpap in. She did see ENT, Dr. Haroldine Lawsrossley, who she reports offended her, by telling pt's husband that Dr. Haroldine Lawsrossley could not operate on her because she was overweight. She has since discussed this with her PCP, who has referred her to an [place]NT in [place]. She wants to know what Dr. Frances FurbishAthar recommends. I advised her of Dr. Teofilo PodAthar's recommendations. Pt is agreeable to me checking with [facility] to find out if they are able to provide her with a cpap. Pt did not want to use [facility]. I will check with [facility].

## 2018-04-21 NOTE — Telephone Encounter (Signed)
Since she has severe OSA, I would not recommend, she leave her OSA untreated given the longterm risk for cardiovascular disease.  We can send order to another DME of pt's choosing, she can look into which DME she would like to go with. She may have to go through another face to face visit and sleep study testing per insurance requirement. She needs to be aware of that.   She may want to seek consultation with ENT and/or Dentistry for alternative treatment options. But realistically, an oral appliance does not work well enough for severe sleep apnea. She is encouraged to talk to her PCP about her option of seeing another sleep specialist too if she prefers.  Please call pt back to advise of the above.

## 2018-04-21 NOTE — Telephone Encounter (Signed)
I called pt to discuss. No answer, left a message asking her to call me back. 

## 2018-04-21 NOTE — Telephone Encounter (Signed)
Received a call from Dr. Malachi ParadiseJoe Adams, dentist in [place]. He reports that pt has come to him for consultation for an oral appliance for her sleep apnea. He reviewed her sleep study and is concerned about the severity of the sleep apnea and mentioned that there may be a component of central sleep apnea. Dr. Pernell DupreAdams reports that he is certainly able to make an oral appliance for this pt's sleep apnea, but would like Dr. Teofilo PodAthar's input on this and wants to know if pt needs to try cpap for a little longer for compliance. I advised Dr. Pernell DupreAdams that I would speak with Dr. Frances FurbishAthar and call the pt back to discuss.

## 2018-04-21 NOTE — Telephone Encounter (Addendum)
I reviewed patient's CPAP compliance data from her set up date of 11/07/2017, during which time she used her CPAP only 3 days, indicating noncompliance. There is just not enough data to allow us to comment on whether she is well treated or not. Based on her sleep study results I would not think that she would be a good candidate for an oral appliance. It would certainly not seem to be able to take care of her sleep apnea of this degree. She may be a candidate for a BiPAP titration at some point but she has to be compliant with CPAP first for me to be able to see if she needs a different type of treatment modality. Please notify her dentist.

## 2018-04-21 NOTE — Telephone Encounter (Signed)
I spoke with [facility], here is their response: "Well the only thing to do is start her over [employer] requires new F2F and new study with new order."

## 2018-04-22 NOTE — Telephone Encounter (Signed)
I called pt and explained Dr. Teofilo PodAthar's recommendations. Pt says that she could not tolerate cpap. She will speak with her PCP regarding the ENT and possibly another sleep specialist referral, and will call us back if she decides to repeat the OV and sleep studies to obtain another cpap. I explained the risks of untreated severe sleep apnea, and that the oral appliance will likely not work well enough for severe sleep apnea. Pt mentioned that she is going to also meet with a nutritionist to pursue weight loss. I advised her that weight loss will likely help sleep apnea, but may not resolve it completely, and she would need to be retested for sleep apnea to ensure resolution of sleep apnea if she lost weight. Pt verbalized understanding and will call us after speaking with her PCP further about this.

## 2018-04-28 ENCOUNTER — Ambulatory Visit (INDEPENDENT_AMBULATORY_CARE_PROVIDER_SITE_OTHER): Payer: BLUE CROSS/BLUE SHIELD

## 2018-04-28 DIAGNOSIS — J309 Allergic rhinitis, unspecified: Secondary | ICD-10-CM

## 2018-05-12 ENCOUNTER — Ambulatory Visit (INDEPENDENT_AMBULATORY_CARE_PROVIDER_SITE_OTHER): Payer: BLUE CROSS/BLUE SHIELD

## 2018-05-12 DIAGNOSIS — J309 Allergic rhinitis, unspecified: Secondary | ICD-10-CM

## 2018-05-26 ENCOUNTER — Ambulatory Visit (INDEPENDENT_AMBULATORY_CARE_PROVIDER_SITE_OTHER): Payer: BLUE CROSS/BLUE SHIELD | Admitting: *Deleted

## 2018-05-26 DIAGNOSIS — J309 Allergic rhinitis, unspecified: Secondary | ICD-10-CM

## 2018-05-27 NOTE — Progress Notes (Signed)
VIALS EXP 05-29-19 

## 2018-05-28 DIAGNOSIS — J301 Allergic rhinitis due to pollen: Secondary | ICD-10-CM | POA: Diagnosis not present

## 2018-06-02 ENCOUNTER — Encounter: Payer: Self-pay | Admitting: Nutrition

## 2018-06-02 ENCOUNTER — Encounter: Payer: BLUE CROSS/BLUE SHIELD | Attending: Family Medicine | Admitting: Nutrition

## 2018-06-02 VITALS — Ht 69.0 in | Wt 205.0 lb

## 2018-06-02 DIAGNOSIS — E669 Obesity, unspecified: Secondary | ICD-10-CM

## 2018-06-02 NOTE — Patient Instructions (Addendum)
Goals 1. Follow My Plate 2. Lose 1-2 lbs per week 3. Use [email or web address] to track foods and exercise 4. Cut out sweets, frieds, sodas 5. Drink only water 6. Keep working out.

## 2018-06-02 NOTE — Progress Notes (Signed)
  Medical Nutrition Therapy:  Appt start time: 1030 end time:  1130.   Assessment:  Primary concerns today:  Overweright. Lives with her husband. Has gained 20-30 lbs in the last 5 years. . UBW 170 lbs.  Eats 2-3 meals per day. Drinks sodas, eats sweets and processed food at times. Physical activity: 20 minutes on ellipical and 2 days a week with weights and  Engaged in making changes with diet and exercise to lose weight. Calorie intake exceeds her needs.  Preferred Learning Style:   No preference indicated   Learning Readiness:  Ready  Change in progress   MEDICATIONS: See list   DIETARY INTAKE: 24-hr recall:  B ( AM): Special K cereal, with nuts, fruit and lactose milk Snk ( AM): almonds salted L ( PM): Noodles, chicken and vegetables and 1/2 tea and water Snk ( PM): 3 choc chip cookis. D ( PM): PIntos 1 cup, 1 slice cornbread, slasa, 1 cup lactaid milk Snk ( PM): occasionally popcorn Beverages: water, sodas  Usual physical activity:   Estimated energy needs: 1500  calories 170  g carbohydrates 112 g protein 42 g fat  Progress Towards Goal(s):  In progress.   Nutritional Diagnosis:  NI-1.5 Excessive energy intake As related to recent weight gain.  As evidenced by 20lbs weigh gain and BMI 30.    Intervention:  Nutrition and weight loss education provided on My Plate, CHO counting, meal planning, portion sizes, timing of meals, , benefits of exercising 60  minutes per day and prevention of DM. Marland Kitchen. Goals 1. Follow My Plate 2. Lose 1-2 lbs per week 3. Use [email or web address] to track foods and exercise 4. Cut out sweets, frieds, sodas 5. Drink only water 6. Keep working out.   Teaching Method Utilized:  Visual Auditory Hands on  Handouts given during visit include:  The Plate Method   Meal Plan  Weight loss tips   Barriers to learning/adherence to lifestyle change: none  Demonstrated degree of understanding via:  Teach Back   Monitoring/Evaluation:   Dietary intake, exercise, meal planning , and body weight 1-2 months..Marland Kitchen

## 2018-06-09 ENCOUNTER — Ambulatory Visit (INDEPENDENT_AMBULATORY_CARE_PROVIDER_SITE_OTHER): Payer: BLUE CROSS/BLUE SHIELD | Admitting: *Deleted

## 2018-06-09 DIAGNOSIS — J309 Allergic rhinitis, unspecified: Secondary | ICD-10-CM

## 2018-06-30 ENCOUNTER — Ambulatory Visit (INDEPENDENT_AMBULATORY_CARE_PROVIDER_SITE_OTHER): Payer: BLUE CROSS/BLUE SHIELD | Admitting: *Deleted

## 2018-06-30 DIAGNOSIS — J309 Allergic rhinitis, unspecified: Secondary | ICD-10-CM

## 2018-07-14 ENCOUNTER — Ambulatory Visit (INDEPENDENT_AMBULATORY_CARE_PROVIDER_SITE_OTHER): Payer: BLUE CROSS/BLUE SHIELD | Admitting: *Deleted

## 2018-07-14 DIAGNOSIS — J309 Allergic rhinitis, unspecified: Secondary | ICD-10-CM | POA: Diagnosis not present

## 2018-07-20 ENCOUNTER — Encounter: Payer: BLUE CROSS/BLUE SHIELD | Attending: Family Medicine | Admitting: Nutrition

## 2018-07-20 VITALS — Ht 69.0 in | Wt 204.0 lb

## 2018-07-20 DIAGNOSIS — E669 Obesity, unspecified: Secondary | ICD-10-CM

## 2018-07-20 NOTE — Progress Notes (Signed)
  Medical Nutrition Therapy:  Appt start time: 1030 end time:  1100.   Assessment:  Primary concerns today:  Overweright. Lives with her husband. Lost 1 lb. Has been on vacation and birthdays.  Has been working in trying to eat before 7 pm. She admits to needing to plan ahead for meals and healthier snacks. Exercising some occasionally. Just about cut out all sweet tea.Drinking water.  Vitals with BMI 07/20/2018  Height 5\' 9"   Weight 204 lbs  BMI 30.11  Systolic   Diastolic   Pulse   Respirations    Preferred Learning Style:   No preference indicated   Learning Readiness:  Ready  Change in progress   MEDICATIONS: See list   DIETARY INTAKE: 24-hr recall:  B ( AM): Special K cereal, with nuts, fruit and lactose milk Snk ( AM):  L ( PM): Noodles, chicken and vegetables and 1/2 tea and water Snk ( PM):  D ( PM): PIntos 1 cup, 1 slice cornbread, slasa, 1 cup lactaid milk Snk ( PM): occasionally popcorn Beverages: water, sodas  Usual physical activity:   Estimated energy needs: 1500  calories 170  g carbohydrates 112 g protein 42 g fat  Progress Towards Goal(s):  In progress.   Nutritional Diagnosis:  NI-1.5 Excessive energy intake As related to recent weight gain.  As evidenced by 20lbs weigh gain and BMI 30.    Intervention:  Nutrition and weight loss education provided on My Plate, CHO counting, meal planning, portion sizes, timing of meals, , benefits of exercising 60  minutes per day and prevention of DM. Marland Kitchen.  Goals 1. Use MY Fitness Pal 2.Plan ahead for meals and snacks 3. Increase exercise 30 minutes Lose 1 lb per week.  Teaching Method Utilized:  Visual Auditory Hands on  Handouts given during visit include:  The Plate Method   Meal Plan  Weight loss tips   Barriers to learning/adherence to lifestyle change: none  Demonstrated degree of understanding via:  Teach Back   Monitoring/Evaluation:  Dietary intake, exercise, meal planning , and  body weight 3 mos.

## 2018-07-20 NOTE — Patient Instructions (Addendum)
Goals 1. Use MY Fitness Pal 2.Plan ahead for meals and snacks 3. Increase exercise 30 minutes Lose 1 lb per week.

## 2018-07-21 ENCOUNTER — Encounter: Payer: Self-pay | Admitting: Nutrition

## 2018-07-21 ENCOUNTER — Ambulatory Visit (INDEPENDENT_AMBULATORY_CARE_PROVIDER_SITE_OTHER): Payer: BLUE CROSS/BLUE SHIELD | Admitting: *Deleted

## 2018-07-21 DIAGNOSIS — J309 Allergic rhinitis, unspecified: Secondary | ICD-10-CM | POA: Diagnosis not present

## 2018-07-28 ENCOUNTER — Ambulatory Visit (INDEPENDENT_AMBULATORY_CARE_PROVIDER_SITE_OTHER): Payer: BLUE CROSS/BLUE SHIELD

## 2018-07-28 DIAGNOSIS — J309 Allergic rhinitis, unspecified: Secondary | ICD-10-CM | POA: Diagnosis not present

## 2018-08-03 ENCOUNTER — Telehealth: Payer: Self-pay | Admitting: Neurology

## 2018-08-03 NOTE — Telephone Encounter (Signed)
Pt called stating that she never got the hang of the CPAP machine due to her mother passing around the time of starting it. Requesting a call to discuss moving forward with the CPAP, advised that she hadn't been seen in over a year and would need a follow up prior but requested a call from RN first

## 2018-08-03 NOTE — Telephone Encounter (Signed)
I called pt. She would like to start cpap again. She understands that she need an OV and a new sleep study. An appt was scheduled for 10/19/18 but pt wants a call if something opens up sooner. I have placed her on my waitlist. Pt verbalized understanding.

## 2018-08-04 ENCOUNTER — Ambulatory Visit (INDEPENDENT_AMBULATORY_CARE_PROVIDER_SITE_OTHER): Payer: BLUE CROSS/BLUE SHIELD | Admitting: *Deleted

## 2018-08-04 DIAGNOSIS — J309 Allergic rhinitis, unspecified: Secondary | ICD-10-CM

## 2018-08-04 NOTE — Telephone Encounter (Signed)
I called pt, no answer, left a message asking her to call me back.  There are 2 openings tomorrow with Dr. Frances Furbish. If pt calls back and these appointments are still open, please offer them to the pt.

## 2018-08-04 NOTE — Telephone Encounter (Signed)
I called pt and advised her of an appt tomorrow that is open at 9:30am. Pt is agreeable to this appt and understands to check in at 9am. Pt verbalized understanding of new appt date and time.

## 2018-08-05 ENCOUNTER — Ambulatory Visit: Payer: BLUE CROSS/BLUE SHIELD | Admitting: Neurology

## 2018-08-05 ENCOUNTER — Encounter: Payer: Self-pay | Admitting: Neurology

## 2018-08-05 VITALS — BP 135/96 | HR 78 | Ht 69.0 in | Wt 203.0 lb

## 2018-08-05 DIAGNOSIS — G4733 Obstructive sleep apnea (adult) (pediatric): Secondary | ICD-10-CM

## 2018-08-05 NOTE — Patient Instructions (Signed)
I would like for you to restart CPAP therapy at home. I have placed the order in the chart. Please get in touch with [facility] to get comfortable with using it. You will need as follow up appointment in about 10 weeks post set up, that has to be scheduled; please go ahead and schedule with one of our nurse practitioners.  We will arrange for your CPAP set up at home through [facility], a local DME company.   The address for [facility] is: [address] [phone].   Please use your CPAP regularly. While your insurance requires that you use CPAP at least 4 hours each night on 70% of the nights, I recommend, that you not skip any nights and use it throughout the night if you can, even for scheduled naps. Getting used to CPAP and staying with the treatment long term does take time and patience and discipline. Untreated obstructive sleep apnea when it is moderate to severe can have an adverse impact on cardiovascular health and raise her risk for heart disease, arrhythmias, hypertension, congestive heart failure, stroke and diabetes. Untreated obstructive sleep apnea causes sleep disruption, nonrestorative sleep, and sleep deprivation. This can have an impact on your day to day functioning and cause daytime sleepiness and impairment of cognitive function, memory loss, mood disturbance, and problems focussing. Using CPAP regularly can improve these symptoms.

## 2018-08-05 NOTE — Progress Notes (Signed)
Subjective:    Patient ID: Brooke Farrell is a 62 y.o. female.  HPI     Interim history:   Brooke Farrell is a 62 year old right-handed woman with an underlying medical history of hypertension, reflux disease, IBS with diarrhea, anxiety, depression, history of kidney stone, depression, allergic rhinitis, and obesity, who presents for reevaluation of her obstructive sleep apnea. The patient is unaccompanied today. I first met her on 07/29/2017 at the request of her primary care physician, at which time she reported snoring and daytime somnolence as well as oxygen desaturations noted during her colonoscopy in August 2018. She also reported a family history of sleep apnea and witnessed apneas per husband's report. She was advised to proceed with a sleep study. She had a split-night sleep study on 08/29/2017 which showed a sleep efficiency of 82.8% at baseline, increased light stage sleep and decreased REM sleep, total AHI of 65.7 per hour. Supine AHI was 110.4 per hour. Average oxygen saturation was 92%, nadir was 75%. She had no significant PLMS. She was noted to have moderate snoring during the baseline portion of the study. She was titrated on CPAP between 5 cm and 10 cm. She was briefly tried on BiPAP and BiPAP ST because she had central apneas. On the final CPAP pressure of 10 cm her AHI was 3.9 per hour, O2 nadir of 84%. I prescribed CPAP therapy for home use. Patient was unable to tolerate CPAP and returned the CPAP machine to her DME company.   Today, 08/05/2018: She reports interim health issues. In February or March 2019 she was diagnosed with hiatal hernia. She was supposed to go through with a pH probe and eventually have surgery for her hiatal hernia but she could not go through with the pH probe, developed severe nosebleeds. She reports that she even had a syncopal spell because of significant nosebleed. She saw GI, has been taking her acid reflux medicine in the morning which has been helpful,  tries to avoid late night eating and hydrates well. Nevertheless, she has ongoing issues with daytime somnolence, sleep disruption, loud snoring and witnessed apneas. She saw ENT in the interim and was diagnosed with deviated septum but was encouraged to lose weight. She saw dentistry for potential consideration of a dental device but was not deemed a appropriate candidate. She was encouraged to seek treatment with CPAP again. She would be willing to try CPAP, may be able to use a nasal cushion interphase, was having trouble with the nasal mask or nasal pillows and indicates claustrophobia would not favor a full facemask.  She has nocturia about once or twice per average night, but time around 9 PM, rise time around 6 as she takes her grandchildren to school. She has had occasional morning headaches. Her mother passed a away in June. She is holding up okay, has to sell house, working on it with her siblings.   The patient's allergies, current medications, family history, past medical history, past social history, past surgical history and problem list were reviewed and updated as appropriate.   Previously:  07/29/2017: (She) reports snoring and daytime somnolence as well as witnessed oxygen desaturations during her colonoscopy recently on 06/26/2017. She has a sister with sleep apnea and her husband has also noticed pauses in her breathing while asleep. I reviewed your office note from 02/10/2017, which you kindly included. Her Epworth sleepiness score is 11 out of 24 today, fatigue score is 49 out of 63. She is retired, worked for a [employer]  for over 30 years, she lives with her husband. They have 1 son who lives across the street from them with his family. She is a nonsmoker and does not currently utilize alcohol and does not use illicit drugs, drinks caffeine in the form of coffee, 1-1/2 cups per day and cola once a day.  She has had non-restorative sleep for about 2 years, and has had difficulty with  sleep onset and sleep maintentance for about 2-3 years, has been on Ambien for about this time. She has an elderly, 63 yo in a NH.  She has a sister and 2 brother, she has one son.  She has had some AM HAs, nocturia, about 1-2/night. She goes to bed around 9PM, and WT is around 5:15 AM and takes her GS to school. She denies telltale restless leg symptoms, sometimes her right hip bothers her.  Her Past Medical History Is Significant For: No past medical history on file.  Her Past Surgical History Is Significant For: No past surgical history on file.  Her Family History Is Significant For: Family History  Problem Relation Age of Onset  . Allergic rhinitis Neg Hx   . Angioedema Neg Hx   . Asthma Neg Hx   . Atopy Neg Hx   . Eczema Neg Hx   . Immunodeficiency Neg Hx   . Urticaria Neg Hx     Her Social History Is Significant For: Social History   Socioeconomic History  . Marital status: Married    Spouse name: Not on file  . Number of children: Not on file  . Years of education: Not on file  . Highest education level: Not on file  Occupational History  . Not on file  Social Needs  . Financial resource strain: Not on file  . Food insecurity:    Worry: Not on file    Inability: Not on file  . Transportation needs:    Medical: Not on file    Non-medical: Not on file  Tobacco Use  . Smoking status: Never Smoker  . Smokeless tobacco: Never Used  Substance and Sexual Activity  . Alcohol use: No    Alcohol/week: 0.0 standard drinks  . Drug use: No  . Sexual activity: Not on file  Lifestyle  . Physical activity:    Days per week: Not on file    Minutes per session: Not on file  . Stress: Not on file  Relationships  . Social connections:    Talks on phone: Not on file    Gets together: Not on file    Attends religious service: Not on file    Active member of club or organization: Not on file    Attends meetings of clubs or organizations: Not on file    Relationship  status: Not on file  Other Topics Concern  . Not on file  Social History Narrative  . Not on file    Her Allergies Are:  Allergies  Allergen Reactions  . Prednisone Other (See Comments)    Patient reports having very bad mood swings and was very angry when on this drug  . Robitussin (Alcohol Free)  [Guaifenesin] Swelling  . Sulfa Antibiotics Swelling  . Amoxicillin Diarrhea  :   Her Current Medications Are:  Outpatient Encounter Medications as of 08/05/2018  Medication Sig  . albuterol (PROAIR HFA) 108 (90 Base) MCG/ACT inhaler Inhale 2 puffs into the lungs every 4 (four) hours as needed for wheezing or shortness of breath.  Marland Kitchen azelastine (  ASTELIN) 0.1 % nasal spray Place 1 spray into both nostrils daily. Use in each nostril as directed  . cetirizine (ZYRTEC) 10 MG tablet Take 10 mg by mouth daily as needed.   Marland Kitchen Dexlansoprazole 30 MG capsule Take 30 mg by mouth daily as needed.  . Eluxadoline (VIBERZI) 75 MG TABS Take 75 mg by mouth daily.  Marland Kitchen EPINEPHrine (EPIPEN 2-PAK) 0.3 mg/0.3 mL IJ SOAJ injection Inject 0.3 mLs (0.3 mg total) into the muscle as needed (for severe life-threatening allergic reaction).  Marland Kitchen FLUoxetine (PROZAC) 10 MG capsule Take 20 mg by mouth daily.   . fluticasone (FLONASE) 50 MCG/ACT nasal spray Place 1 spray into both nostrils daily.  Marland Kitchen levocetirizine (XYZAL) 5 MG tablet Take 1 tablet (5 mg total) by mouth daily as needed. Reported on 03/05/2016  . montelukast (SINGULAIR) 10 MG tablet Take 1 tablet (10 mg total) by mouth daily as needed.  . zolpidem (AMBIEN) 10 MG tablet Take 1 tablet by mouth as needed.   No facility-administered encounter medications on file as of 08/05/2018.   :  Review of Systems:  Out of a complete 14 point review of systems, all are reviewed and negative with the exception of these symptoms as listed below: Review of Systems  Neurological:       Pt presents today to discuss her sleep. Pt is interested in starting a cpap again.     Objective:  Neurological Exam  Physical Exam Physical Examination:   Vitals:   08/05/18 0912  BP: (!) 135/96  Pulse: 78    General Examination: The patient is a very pleasant 62 y.o. female in no acute distress. She appears well-developed and well-nourished and well groomed.   HEENT: Normocephalic, atraumatic, pupils are equal, round and reactive to light and accommodation. Extraocular tracking is good without limitation to gaze excursion or nystagmus noted. Normal smooth pursuit is noted. Hearing is grossly intact. Face is symmetric with normal facial animation and normal facial sensation. Speech is clear with no dysarthria noted. There is no hypophonia. There is no lip, neck/head, jaw or voice tremor. Neck is supple with full range of passive and active motion. There are no carotid bruits on auscultation. Oropharynx exam reveals: mild mouth dryness, adequate dental hygiene and moderate airway crowding. Mallampati is class II. Tongue protrudes centrally and palate elevates symmetrically. Neck size is 15 7/8 inches. She has a minimal or mild overbite. She has septal deviation to the right.  Chest: Clear to auscultation without wheezing, rhonchi or crackles noted.  Heart: S1+S2+0, regular and normal without murmurs, rubs or gallops noted.   Abdomen: Soft, non-tender and non-distended with normal bowel sounds appreciated on auscultation.  Extremities: There is no pitting edema in the distal lower extremities bilaterally.  Skin: Warm and dry without trophic changes noted. There are no varicose veins.  Musculoskeletal: exam reveals no obvious joint deformities, tenderness or joint swelling or erythema.   Neurologically:  Mental status: The patient is awake, alert and oriented in all 4 spheres. Her immediate and remote memory, attention, language skills and fund of knowledge are appropriate. There is no evidence of aphasia, agnosia, apraxia or anomia. Speech is clear with normal  prosody and enunciation. Thought process is linear. Mood is normal and affect is normal.  Cranial nerves II - XII are as described above under HEENT exam. In addition: shoulder shrug is normal with equal shoulder height noted. Motor exam: Normal bulk, strength and tone is noted. There is no drift, tremor or rebound. Romberg  is negative. Reflexes are 2+ throughout. Fine motor skills and coordination: grossly intact.  Cerebellar testing: No dysmetria or intention tremor. There is no truncal or gait ataxia.  Sensory exam: intact to light touch in the upper and lower extremities.  Gait, station and balance: She stands easily. No veering to one side is noted. No leaning to one side is noted. Posture is age-appropriate and stance is narrow based. Gait shows normal stride length and normal pace. No problems turning are noted.   Assessment and Plan:  In summary, Brooke Farrell is a very pleasant 62 year old female with an underlying medical history of hypertension, reflux disease, IBS with diarrhea, anxiety, depression, history of kidney stone, depression, allergic rhinitis, and obesity, who presents for reevaluation of her sleep disorder, and her prior diagnosis of obstructive sleep apnea. She had a split-night sleep study on 08/29/2017 which showed severe sleep apnea with a baseline AHI of 65.7 per hour, O2 nadir of 75%. She was started on CPAP therapy but could not tolerate it at the time. She has consulted with ENT in the interim, she has seen her dentist for potential dental device but was not deemed a good candidate. She no longer has a CPAP machine. She is willing to restart CPAP therapy. She may do well with nasal cushion interfaces such as Dreamwear or Resmed N30i. She would like to avoid using a fullface mask. I will prescribe CPAP therapy at a pressure of 10 cm based on her sleep study results from October 2018. I would like to see her back within the next 60-90 days after starting CPAP therapy. We  talked about OSA treatment options again today. I explained in particular the risks and ramifications of untreated moderate to severe OSA, especially with respect to developing cardiovascular disease down the Road, including congestive heart failure, difficult to treat hypertension, cardiac arrhythmias, or stroke. Even type 2 diabetes has, in part, been linked to untreated OSA. Symptoms of untreated OSA include daytime sleepiness, memory problems, mood irritability and mood disorder such as depression and anxiety, lack of energy, as well as recurrent headaches, especially morning headaches. We talked about trying to maintain a healthy lifestyle in general, as well as the importance of weight control. I encouraged the patient to eat healthy, exercise daily and keep well hydrated, to keep a scheduled bedtime and wake time routine, to not skip any meals and eat healthy snacks in between meals. I advised the patient not to drive when feeling sleepy. She is working on weight loss. She has seen a nutritionist. I explained the importance of being compliant with PAP treatment, not only for insurance purposes but primarily to improve Her symptoms, and for the patient's long term health benefit, including to reduce Her cardiovascular risks. I answered all her questions today and the patient was in agreement with the plan.  I spent 30 minutes in total face-to-face time with the patient, more than 50% of which was spent in counseling and coordination of care, reviewing test results, reviewing medication and discussing or reviewing the diagnosis of OSA, its prognosis and treatment options. Pertinent laboratory and imaging test results that were available during this visit with the patient were reviewed by me and considered in my medical decision making (see chart for details).

## 2018-08-05 NOTE — Progress Notes (Signed)
Order for new cpap sent to [facility].

## 2018-08-11 ENCOUNTER — Ambulatory Visit (INDEPENDENT_AMBULATORY_CARE_PROVIDER_SITE_OTHER): Payer: BLUE CROSS/BLUE SHIELD

## 2018-08-11 DIAGNOSIS — J309 Allergic rhinitis, unspecified: Secondary | ICD-10-CM

## 2018-08-24 ENCOUNTER — Telehealth: Payer: Self-pay | Admitting: Family Medicine

## 2018-08-24 NOTE — Telephone Encounter (Signed)
Patient is currently a pt of Dr.Badgers office out of [place], [place]. Pt is looking for a Dr. Roxana Hires, she brings current pt's of [facility] to Dr. Alfonzo Beers through our office.

## 2018-08-25 ENCOUNTER — Telehealth: Payer: Self-pay | Admitting: *Deleted

## 2018-08-25 ENCOUNTER — Other Ambulatory Visit: Payer: Self-pay | Admitting: *Deleted

## 2018-08-25 MED ORDER — ALBUTEROL SULFATE HFA 108 (90 BASE) MCG/ACT IN AERS: 2.0000 | INHALATION_SPRAY | RESPIRATORY_TRACT | 0 refills | Status: DC | PRN

## 2018-08-25 NOTE — Telephone Encounter (Signed)
Patient called office stating she had been seen last week at [facility] for bronchitis and started Cefdinir.  Patient states she has had some wheezing and would like to see Dr. Dellis Anes tomorrow in [place].  Patient does not have any ProAir and has her yearly appointment made with Dr. Dellis Anes on 09/16/18.  Spoke with Dr. Dellis Anes and okay to send in refill for ProAir and made appointment for 08/26/18 at 4:00 pm in [place] office.  Patient informed and voiced understanding.

## 2018-08-26 ENCOUNTER — Ambulatory Visit: Payer: BLUE CROSS/BLUE SHIELD | Admitting: Allergy & Immunology

## 2018-08-26 ENCOUNTER — Encounter: Payer: Self-pay | Admitting: Allergy & Immunology

## 2018-08-26 DIAGNOSIS — J302 Other seasonal allergic rhinitis: Secondary | ICD-10-CM | POA: Insufficient documentation

## 2018-08-26 DIAGNOSIS — J069 Acute upper respiratory infection, unspecified: Secondary | ICD-10-CM

## 2018-08-26 DIAGNOSIS — J3089 Other allergic rhinitis: Secondary | ICD-10-CM | POA: Diagnosis not present

## 2018-08-26 DIAGNOSIS — J4520 Mild intermittent asthma, uncomplicated: Secondary | ICD-10-CM | POA: Diagnosis not present

## 2018-08-26 DIAGNOSIS — J4521 Mild intermittent asthma with (acute) exacerbation: Secondary | ICD-10-CM

## 2018-08-26 DIAGNOSIS — B9789 Other viral agents as the cause of diseases classified elsewhere: Secondary | ICD-10-CM

## 2018-08-26 MED ORDER — GUAIFENESIN-CODEINE 100-10 MG/5ML PO SYRP: 5.0000 mL | ORAL_SOLUTION | Freq: Three times a day (TID) | ORAL | 0 refills | Status: DC | PRN

## 2018-08-26 MED ORDER — METHYLPREDNISOLONE ACETATE 80 MG/ML IJ SUSP
80.0000 mg | Freq: Once | INTRAMUSCULAR | Status: AC
  Administered 2018-08-26: 80 mg via INTRAMUSCULAR

## 2018-08-26 NOTE — Addendum Note (Signed)
Addended by: Florence Canner on: 08/26/2018 04:34 PM   Modules accepted: Orders

## 2018-08-26 NOTE — Progress Notes (Signed)
FOLLOW UP  Date of Service/Encounter:  08/26/18   Assessment:   Mild intermittent asthma with acute exacerbation  Seasonal and perennial allergic rhinitis  Viral URI with cough  Plan/Recommendations:   1. Perennial and seasonal allergic rhinitis - Continue with allergy shots at the same schedule (no shot today).   2. Mild intermittent asthma with acute exacerbation - We did not do lung function testing since you were feeling so bad today.  - Nebulizer treatment given in clinic today.  - DepoMedrol 80mg  given in clinic. - Start the prednisone pack provided: Take 3 tabs (30mg ) twice daily for 3 days, then 2 tabs (20mg ) twice daily for 3 days, then 1 tab (10mg ) twice daily for 3 days, then STOP. - Prescription provided for codeine containing cough medicine to use as needed. - Continue with albuterol four puffs every 4-6 hours as needed for coughing/wheezing. - Spacer provided for adequate delivery of the medication into the lower airways. - The importance of spacer use discussed with the patient in detail. - Continue to take the cefdinir prescribed by your PCP.   3. Return in about 6 months (around 02/25/2019).  Subjective:   Brooke Farrell is a 62 y.o. female presenting today for follow up of  Chief Complaint  Patient presents with  . Allergic Rhinitis     2 wks ago Friday, drainage and couldn't talk. Primary gave an anibotic and a steroid shot.    Brooke Farrell has a history of the following: Patient Active Problem List   Diagnosis Date Noted  . Seasonal and perennial allergic rhinitis 08/26/2018  . Mild intermittent asthma, uncomplicated 08/26/2018  . Allergic rhinoconjunctivitis 07/27/2015  . Cough     History obtained from: chart review and patient.  Brooke Farrell's Primary Care Provider is Eartha Inch, MD.     Brooke Farrell is a 62 y.o. female presenting for a sick visit.  She was last seen in November 2017 for an office visit.  At that time, she did not  feel that her allergy shots were controlling her symptoms adequately.  We did send some blood work and changed her bile slightly.  She has been doing well on these since that time.  Her asthma was controlled with albuterol as needed.  We did treat her with a burst of steroids due to concern for an asthma exacerbation.  Since the last visit, she has mostly done well.  However, she has had 2 weeks of cough and congestion.  She has had a hacking cough productive of green sputum.  She has tried treating with Mucinex, Robitussin, and increasing her Dexilant without improvement.  She did go to see her primary care provider last week and received Depo-Medrol x1.  She was also started on a course of Ceftin ear twice daily for 10 days.  She will be finishing this coming Friday, October 25.  However, her cough has continued despite this.  She has even tried increasing her antihistamines.  She does not have a nebulizer at home, but does have a new albuterol inhaler.  She has been doing 2 puffs every 4 hours.  She reports that she has a headache to this.  She would like to feel better since she is leaving tomorrow morning at 5:30 AM.  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  She is otherwise doing well.  The worst time for her asthma is typically the spring.  Otherwise, there have been no changes to  her past medical history, surgical history, family history, or social history.    Review of Systems: a 14-point review of systems is pertinent for what is mentioned in HPI.  Otherwise, all other systems were negative.  Constitutional: negative other than that listed in the HPI Eyes: negative other than that listed in the HPI Ears, nose, mouth, throat, and face: negative other than that listed in the HPI Respiratory: negative other than that listed in the HPI Cardiovascular: negative other than that listed in the HPI Gastrointestinal: negative other than that listed in the HPI Genitourinary:  negative other than that listed in the HPI Integument: negative other than that listed in the HPI Hematologic: negative other than that listed in the HPI Musculoskeletal: negative other than that listed in the HPI Neurological: negative other than that listed in the HPI Allergy/Immunologic: negative other than that listed in the HPI    Objective:   There were no vitals taken for this visit. There is no height or weight on file to calculate BMI.   Physical Exam:  General: Alert, interactive, in no acute distress. Pleasant female, but clearly sick appearing.  Eyes: No conjunctival injection bilaterally, no discharge on the right, no discharge on the left and no Horner-Trantas dots present. PERRL bilaterally. EOMI without pain. No photophobia.  Ears: Right TM pearly gray with normal light reflex, Left TM pearly gray with normal light reflex, Right TM intact without perforation and Left TM intact without perforation.  Nose/Throat: External nose within normal limits and septum midline. Turbinates edematous and pale with clear discharge. Posterior oropharynx erythematous without cobblestoning in the posterior oropharynx. Tonsils 2+ without exudates.  Tongue without thrush. Lungs: Decreased breath sounds bilaterally without wheezing, rhonchi or rales. Difficult for her to take a deep breath without coughing uncontrollably. Increased work of breathing. CV: Normal S1/S2. No murmurs. Capillary refill <2 seconds.  Skin: Warm and dry, without lesions or rashes. Neuro:   Grossly intact. No focal deficits appreciated. Responsive to questions.  Diagnostic studies: none (DepoMedrol 80mg  given in clinic today)     Malachi Bonds, MD  [facility]

## 2018-08-26 NOTE — Patient Instructions (Addendum)
1. Perennial and seasonal allergic rhinitis - Continue with allergy shots at the same schedule (no shot today).   2. Mild intermittent asthma with acute exacerbation - We did not do lung function testing since you were feeling so bad today.  - Nebulizer treatment given in clinic today.  - DepoMedrol 80mg  given in clinic. - Start the prednisone pack provided: Take 3 tabs (30mg ) twice daily for 3 days, then 2 tabs (20mg ) twice daily for 3 days, then 1 tab (10mg ) twice daily for 3 days, then STOP. - Prescription provided for codeine containing cough medicine to use as needed. - Continue with albuterol four puffs every 4-6 hours as needed for coughing/wheezing. - Spacer provided for adequate delivery of the medication into the lower airways.  - Continue to take the cefdinir prescribed by your PCP.   3. Return in about 6 months (around 02/25/2019).  Please inform us of any Emergency Department visits, hospitalizations, or changes in symptoms. Call us before going to the ED for breathing or allergy symptoms since we might be able to fit you in for a sick visit. Feel free to contact us anytime with any questions, problems, or concerns.  It was a pleasure to see you again today! Have a good trip to [place]!   Websites that have reliable patient information: 1. American Academy of Asthma, Allergy, and Immunology: www.aaaai.org 2. Food Allergy Research and Education (FARE): foodallergy.org 3. Mothers of Asthmatics: http://www.asthmacommunitynetwork.org 4. American College of Allergy, Asthma, and Immunology: www.acaai.org

## 2018-08-31 ENCOUNTER — Telehealth: Payer: Self-pay | Admitting: Allergy & Immunology

## 2018-08-31 NOTE — Telephone Encounter (Signed)
Pt called back and was informed to continue taking the Prednisone and until completed, and if after 72 hours after she has completed the pred pack and she's still not feeling any better to give [place] a call back.

## 2018-08-31 NOTE — Telephone Encounter (Signed)
Patient is calling saying that she is congested and not feeling any better Wants to know if she needs to come in and be seen

## 2018-08-31 NOTE — Telephone Encounter (Signed)
Called patient left message to return call

## 2018-09-09 ENCOUNTER — Ambulatory Visit (INDEPENDENT_AMBULATORY_CARE_PROVIDER_SITE_OTHER): Payer: BLUE CROSS/BLUE SHIELD | Admitting: *Deleted

## 2018-09-09 ENCOUNTER — Ambulatory Visit: Payer: Self-pay

## 2018-09-09 DIAGNOSIS — J302 Other seasonal allergic rhinitis: Secondary | ICD-10-CM | POA: Diagnosis not present

## 2018-09-09 DIAGNOSIS — J3089 Other allergic rhinitis: Principal | ICD-10-CM

## 2018-09-10 DIAGNOSIS — G4733 Obstructive sleep apnea (adult) (pediatric): Secondary | ICD-10-CM | POA: Insufficient documentation

## 2018-09-15 ENCOUNTER — Ambulatory Visit: Payer: BLUE CROSS/BLUE SHIELD | Admitting: Allergy & Immunology

## 2018-09-16 ENCOUNTER — Encounter: Payer: Self-pay | Admitting: Allergy & Immunology

## 2018-09-16 ENCOUNTER — Other Ambulatory Visit: Payer: Self-pay | Admitting: Allergy & Immunology

## 2018-09-16 ENCOUNTER — Ambulatory Visit: Payer: BLUE CROSS/BLUE SHIELD | Admitting: Allergy & Immunology

## 2018-09-16 VITALS — BP 126/72 | HR 86 | Resp 18 | Ht 68.0 in | Wt 199.0 lb

## 2018-09-16 DIAGNOSIS — B9789 Other viral agents as the cause of diseases classified elsewhere: Secondary | ICD-10-CM

## 2018-09-16 DIAGNOSIS — J069 Acute upper respiratory infection, unspecified: Secondary | ICD-10-CM | POA: Diagnosis not present

## 2018-09-16 DIAGNOSIS — J302 Other seasonal allergic rhinitis: Secondary | ICD-10-CM | POA: Diagnosis not present

## 2018-09-16 DIAGNOSIS — J3089 Other allergic rhinitis: Secondary | ICD-10-CM | POA: Diagnosis not present

## 2018-09-16 DIAGNOSIS — J4520 Mild intermittent asthma, uncomplicated: Secondary | ICD-10-CM

## 2018-09-16 MED ORDER — GUAIFENESIN-CODEINE 100-10 MG/5ML PO SYRP: 5.0000 mL | ORAL_SOLUTION | Freq: Three times a day (TID) | ORAL | 0 refills | Status: DC | PRN

## 2018-09-16 NOTE — Progress Notes (Signed)
FOLLOW UP  Date of Service/Encounter:  09/16/18   Assessment:   Mild intermittent asthma - with recent exacerbation (spirometry improved)  Viral URI with prolonged cough   Seasonal and perennial allergic rhinitis - on allergen immunotherapy (maintenance reached August 2017)  Plan/Recommendations:   1. Perennial and seasonal allergic rhinitis - Continue with allergy shots at the same schedule.   2. Mild intermittent asthma with recent exacerbation (improved spirometry but continued coughing) - Spirometry looked much better today. - I think we are going to give you Symbicort to use until you improve: two puffs twice daily for 2 weeks or so - Refill of the cough medicine provided.  - Continue with albuterol four puffs every 4-6 hours as needed for coughing/wheezing.  3. Return in about 6 months (around 03/17/2019).  Subjective:   Brooke Farrell is a 62 y.o. female presenting today for follow up of  Chief Complaint  Patient presents with  . Asthma    doing better since last visit. still some chest congestion but not as much.     Brooke RoVivian H Burgueno has a history of the following: Patient Active Problem List   Diagnosis Date Noted  . Seasonal and perennial allergic rhinitis 08/26/2018  . Mild intermittent asthma, uncomplicated 08/26/2018  . Allergic rhinoconjunctivitis 07/27/2015  . Cough     History obtained from: chart review and patient.  Eliott NineVivian H Theil's Primary Care Provider is Eartha InchBadger, Michael C, MD.     Brooke Farrell is a 62 y.o. female presenting for a follow up visit.  She was last seen in October 2019 for sick visit.  At that time, we gave a nebulizer treatment as well as Depo-Medrol.  We also started her on a prolonged prednisone taper.  We gave her a cough medicine and continued with albuterol 4 puffs every 4-6 hours as needed.  She was already on Omnicef and we recommended that she continue with that.  Since the last visit, she has mostly done well. She continues  to cough and has some productivity to it. The rescue inhaler does help some. She did go to [place] with her grandson, who actually became ill himself and was diagnosed with pneumonia.  She did use her cough medicine, which did provide some relief.  She is now out of it.  She has continued to use her albuterol frequently with transient improvement.  Overall, she is doing better but is just frustrated that the cough has remained in such a problem.  She has never been on a controller medication.  Brooke Farrell is on allergen immunotherapy. She receives two injections. Immunotherapy script #1 contains weeds, molds, dust mites and dog. She currently receives 0.2250mL of the RED vial (1/100). Immunotherapy script #2 contains weeds and grasses. She currently receives 0.8950mL of the RED vial (1/100). She started shots early 2017 and reached maintenance in August of 2017.  She does have GERD and is working on dietary modifications to see if she can get off of her Dexilant. She does have a hiatal hernia and apparently an endoscopy performed that resulted in bleeding.   Otherwise, there have been no changes to her past medical history, surgical history, family history, or social history. She is busy at home taking care of her husband, who recently had his third left shoulder surgery.     Review of Systems: a 14-point review of systems is pertinent for what is mentioned in HPI.  Otherwise, all other systems were negative.  Constitutional: negative other than that listed  in the HPI Eyes: negative other than that listed in the HPI Ears, nose, mouth, throat, and face: negative other than that listed in the HPI Respiratory: negative other than that listed in the HPI Cardiovascular: negative other than that listed in the HPI Gastrointestinal: negative other than that listed in the HPI Genitourinary: negative other than that listed in the HPI Integument: negative other than that listed in the HPI Hematologic: negative other than  that listed in the HPI Musculoskeletal: negative other than that listed in the HPI Neurological: negative other than that listed in the HPI Allergy/Immunologic: negative other than that listed in the HPI    Objective:   Blood pressure 126/72, pulse 86, resp. rate 18, height 5\' 8"  (1.727 m), weight 199 lb (90.3 kg), SpO2 98 %. Body mass index is 30.26 kg/m.   Physical Exam:  General: Alert, interactive, in no acute distress. Amusing and cracking jokes.  Eyes: No conjunctival injection bilaterally, no discharge on the right, no discharge on the left and no Horner-Trantas dots present. PERRL bilaterally. EOMI without pain. No photophobia.  Ears: Right TM pearly gray with normal light reflex, Left TM pearly gray with normal light reflex, Right TM intact without perforation and Left TM intact without perforation.  Nose/Throat: External nose within normal limits and septum midline. Turbinates edematous and pale with clear discharge. Posterior oropharynx erythematous without cobblestoning in the posterior oropharynx. Tonsils 2+ without exudates.  Tongue without thrush. Lungs: Clear to auscultation without wheezing, rhonchi or rales. No increased work of breathing. CV: Normal S1/S2. No murmurs. Capillary refill <2 seconds.  Skin: Warm and dry, without lesions or rashes. Neuro:   Grossly intact. No focal deficits appreciated. Responsive to questions.  Diagnostic studies:   Spirometry: results abnormal (FEV1: 2.31/90%, FVC: 2.54/73%, FEV1/FVC: 91%).    Spirometry consistent with possible restrictive disease.   Allergy Studies: none      Malachi Bonds, MD  [facility]

## 2018-09-16 NOTE — Patient Instructions (Addendum)
1. Perennial and seasonal allergic rhinitis - Continue with allergy shots at the same schedule.   2. Mild intermittent asthma with recent exacerbation (improved spirometry but continued coughing) - Spirometry looked much better today. - I think we are going to give you Symbicort to use until you improve: two puffs twice daily for 2 weeks or so - Refill of the cough medicine provided.  - Continue with albuterol four puffs every 4-6 hours as needed for coughing/wheezing.  3. Return in about 6 months (around 03/17/2019).   Please inform us of any Emergency Department visits, hospitalizations, or changes in symptoms. Call us before going to the ED for breathing or allergy symptoms since we might be able to fit you in for a sick visit. Feel free to contact us anytime with any questions, problems, or concerns.  It was a pleasure to see you again today! I hope you feel better!   Websites that have reliable patient information: 1. American Academy of Asthma, Allergy, and Immunology: www.aaaai.org 2. Food Allergy Research and Education (FARE): foodallergy.org 3. Mothers of Asthmatics: http://www.asthmacommunitynetwork.org 4. American College of Allergy, Asthma, and Immunology: MissingWeapons.cawww.acaai.org   Make sure you are registered to vote! If you have moved or changed any of your contact information, you will need to get this updated before voting!

## 2018-09-23 ENCOUNTER — Ambulatory Visit (INDEPENDENT_AMBULATORY_CARE_PROVIDER_SITE_OTHER): Payer: BLUE CROSS/BLUE SHIELD

## 2018-09-23 DIAGNOSIS — J309 Allergic rhinitis, unspecified: Secondary | ICD-10-CM

## 2018-09-29 ENCOUNTER — Ambulatory Visit: Payer: BLUE CROSS/BLUE SHIELD | Admitting: Nutrition

## 2018-10-07 ENCOUNTER — Ambulatory Visit (INDEPENDENT_AMBULATORY_CARE_PROVIDER_SITE_OTHER): Payer: BLUE CROSS/BLUE SHIELD

## 2018-10-07 DIAGNOSIS — J309 Allergic rhinitis, unspecified: Secondary | ICD-10-CM | POA: Diagnosis not present

## 2018-10-14 DIAGNOSIS — J301 Allergic rhinitis due to pollen: Secondary | ICD-10-CM

## 2018-10-19 ENCOUNTER — Ambulatory Visit: Payer: Self-pay | Admitting: Neurology

## 2018-11-11 ENCOUNTER — Ambulatory Visit (INDEPENDENT_AMBULATORY_CARE_PROVIDER_SITE_OTHER): Payer: BLUE CROSS/BLUE SHIELD | Admitting: *Deleted

## 2018-11-11 ENCOUNTER — Ambulatory Visit: Payer: BLUE CROSS/BLUE SHIELD | Admitting: Neurology

## 2018-11-11 DIAGNOSIS — J309 Allergic rhinitis, unspecified: Secondary | ICD-10-CM

## 2018-11-18 ENCOUNTER — Ambulatory Visit (INDEPENDENT_AMBULATORY_CARE_PROVIDER_SITE_OTHER): Payer: BLUE CROSS/BLUE SHIELD

## 2018-11-18 DIAGNOSIS — J309 Allergic rhinitis, unspecified: Secondary | ICD-10-CM

## 2018-12-02 ENCOUNTER — Ambulatory Visit (INDEPENDENT_AMBULATORY_CARE_PROVIDER_SITE_OTHER): Payer: BLUE CROSS/BLUE SHIELD | Admitting: *Deleted

## 2018-12-02 DIAGNOSIS — J309 Allergic rhinitis, unspecified: Secondary | ICD-10-CM | POA: Diagnosis not present

## 2018-12-09 ENCOUNTER — Ambulatory Visit (INDEPENDENT_AMBULATORY_CARE_PROVIDER_SITE_OTHER): Payer: BLUE CROSS/BLUE SHIELD

## 2018-12-09 DIAGNOSIS — J309 Allergic rhinitis, unspecified: Secondary | ICD-10-CM | POA: Diagnosis not present

## 2018-12-16 ENCOUNTER — Ambulatory Visit (INDEPENDENT_AMBULATORY_CARE_PROVIDER_SITE_OTHER): Payer: BLUE CROSS/BLUE SHIELD

## 2018-12-16 DIAGNOSIS — J309 Allergic rhinitis, unspecified: Secondary | ICD-10-CM

## 2018-12-23 ENCOUNTER — Ambulatory Visit (INDEPENDENT_AMBULATORY_CARE_PROVIDER_SITE_OTHER): Payer: BLUE CROSS/BLUE SHIELD

## 2018-12-23 DIAGNOSIS — J309 Allergic rhinitis, unspecified: Secondary | ICD-10-CM

## 2019-01-06 ENCOUNTER — Ambulatory Visit (INDEPENDENT_AMBULATORY_CARE_PROVIDER_SITE_OTHER): Payer: BLUE CROSS/BLUE SHIELD

## 2019-01-06 DIAGNOSIS — J309 Allergic rhinitis, unspecified: Secondary | ICD-10-CM

## 2019-01-13 ENCOUNTER — Ambulatory Visit (INDEPENDENT_AMBULATORY_CARE_PROVIDER_SITE_OTHER): Payer: BLUE CROSS/BLUE SHIELD

## 2019-01-13 DIAGNOSIS — J309 Allergic rhinitis, unspecified: Secondary | ICD-10-CM

## 2019-01-21 ENCOUNTER — Ambulatory Visit: Payer: BLUE CROSS/BLUE SHIELD | Admitting: Neurology

## 2019-01-21 ENCOUNTER — Other Ambulatory Visit: Payer: Self-pay

## 2019-01-21 ENCOUNTER — Encounter: Payer: Self-pay | Admitting: Neurology

## 2019-01-21 ENCOUNTER — Telehealth: Payer: Self-pay

## 2019-01-21 VITALS — BP 124/81 | HR 79 | Temp 98.5°F | Ht 69.0 in | Wt 205.0 lb

## 2019-01-21 DIAGNOSIS — G4733 Obstructive sleep apnea (adult) (pediatric): Secondary | ICD-10-CM

## 2019-01-21 DIAGNOSIS — Z9989 Dependence on other enabling machines and devices: Secondary | ICD-10-CM

## 2019-01-21 NOTE — Patient Instructions (Signed)
Please continue using your CPAP regularly. While your insurance requires that you use CPAP at least 4 hours each night on 70% of the nights, I recommend, that you not skip any nights and use it throughout the night if you can. Getting used to CPAP and staying with the treatment long term does take time and patience and discipline. Untreated obstructive sleep apnea when it is moderate to severe can have an adverse impact on cardiovascular health and raise her risk for heart disease, arrhythmias, hypertension, congestive heart failure, stroke and diabetes. Untreated obstructive sleep apnea causes sleep disruption, nonrestorative sleep, and sleep deprivation. This can have an impact on your day to day functioning and cause daytime sleepiness and impairment of cognitive function, memory loss, mood disturbance, and problems focussing. Using CPAP regularly can improve these symptoms.  Good job with the CPAP.  I will order new supplies.  I would like to increase the pressure to 11 cm at the time, just for a little more apnea control.

## 2019-01-21 NOTE — Telephone Encounter (Signed)
Dr. Frances Furbish recommends that Brooke Farrell come in to discuss her compliance visit since the data appears to be corrected now.  I called Brooke Farrell, she is agreeable to coming to the office and will be here for her appt at 1pm.

## 2019-01-21 NOTE — Telephone Encounter (Signed)
I called pt per Dr. Frances Furbish request. Pt's cpap data reflects non compliance. Pt does not need to come to the office today, and Dr. Frances Furbish agreed to speak with her on the phone about her cpap.  Pt reports that she does not wish to come to the office due to her age and the virus. She reports that she uses her cpap almost every night. Pt would like a call from Dr. Frances Furbish to discuss her cpap and compliance.  I reached out to [employer] again. They have fixed pt's account and it does appear that pt uses her cpap almost nightly. Will give to Dr. Frances Furbish for review.

## 2019-01-21 NOTE — Progress Notes (Signed)
Subjective:    Patient ID: Brooke Farrell is a 63 y.o. female.  HPI     Interim history:  Brooke Farrell is a 63 year old right-handed woman with an underlying medical history of hypertension, reflux disease, IBS with diarrhea, anxiety, depression, history of kidney stone, depression, allergic rhinitis, and obesity, who presents for follow-up consultation of her obstructive sleep apnea, after restarting CPAP therapy. The patient is unaccompanied today. I last saw her on 08/05/2018, at which time she reported significant interim health issues including a diagnosis of hiatal hernia, she had severe nosebleeds. She had consulted with ENT, was diagnosed with deviated septum and was encouraged to lose weight for OSA treatment. She saw a dentist for consideration of a dental device but was not deemed a good candidate for treatment. She had lost her mother. She desired to start CPAP again. She did have prior difficulty with that. We talked about her previous test results and I restarted CPAP therapy at a pressure of 10 cm.  Today, 01/21/2019: I reviewed her CPAP compliance data from 12/22/2018 through 01/20/2019 which is a total of 30 days, during which time she used her machine 29 days with percent used days greater than 4 hours at 80%, indicating very good compliance with an average usage of 5 hours and 21 minutes, residual AHI suboptimal at 9.7 per hour, leak on the high side with the 95th percentile at 33 L/m on a pressure of 10 cm with EPR of 3. Most residual events appear to be obstructive brother than central in nature. She reports doing well with CPAP. She sleeps well with it. In fact, when she first restarted it after our visit in October she felt great. She was able to use it around 7 hours. With time she has had difficulty with increase in leak. She also reports that she has not been able to get any replacement supplies and has been in touch repeatedly with her DME company. She notices the headgear  stretching and the mask not fitting as well. She would be willing to increase the pressure to 11 cm only after getting updated supplies. She has had some stress last year, but stresses overall better. She is helping out with kids and grandkids. Last Christmas was difficult for her as she had recently lost her mom, but overall she is doing better.  The patient's allergies, current medications, family history, past medical history, past social history, past surgical history and problem list were reviewed and updated as appropriate.    Previously:     I first met her on 07/29/2017 at the request of her primary care physician, at which time she reported snoring and daytime somnolence as well as oxygen desaturations noted during her colonoscopy in August 2018. She also reported a family history of sleep apnea and witnessed apneas per husband's report. She was advised to proceed with a sleep study. She had a split-night sleep study on 08/29/2017 which showed a sleep efficiency of 82.8% at baseline, increased light stage sleep and decreased REM sleep, total AHI of 65.7 per hour. Supine AHI was 110.4 per hour. Average oxygen saturation was 92%, nadir was 75%. She had no significant PLMS. She was noted to have moderate snoring during the baseline portion of the study. She was titrated on CPAP between 5 cm and 10 cm. She was briefly tried on BiPAP and BiPAP ST because she had central apneas. On the final CPAP pressure of 10 cm her AHI was 3.9 per hour, O2 nadir of  84%. I prescribed CPAP therapy for home use. Patient was unable to tolerate CPAP and returned the CPAP machine to her DME company.    07/29/2017: (She) reports snoring and daytime somnolence as well as witnessed oxygen desaturations during her colonoscopy recently on 06/26/2017. She has a sister with sleep apnea and her husband has also noticed pauses in her breathing while asleep. I reviewed your office note from 02/10/2017, which you kindly included. Her  Epworth sleepiness score is 11 out of 24 today, fatigue score is 49 out of 63. She is retired, worked for a [employer] for over 30 years, she lives with her husband. They have 1 son who lives across the street from them with his family. She is a nonsmoker and does not currently utilize alcohol and does not use illicit drugs, drinks caffeine in the form of coffee, 1-1/2 cups per day and cola once a day.  She has had non-restorative sleep for about 2 years, and has had difficulty with sleep onset and sleep maintentance for about 2-3 years, has been on Ambien for about this time. She has an elderly, 64 yo in a NH.  She has a sister and 2 brother, she has one son.  She has had some AM HAs, nocturia, about 1-2/night. She goes to bed around 9PM, and WT is around 5:15 AM and takes her GS to school. She denies telltale restless leg symptoms, sometimes her right hip bothers her.  Her Past Medical History Is Significant For: Past Medical History:  Diagnosis Date  . Asthma     Her Past Surgical History Is Significant For: Past Surgical History:  Procedure Laterality Date  . NO PAST SURGERIES      Her Family History Is Significant For: Family History  Problem Relation Age of Onset  . Allergic rhinitis Neg Hx   . Angioedema Neg Hx   . Asthma Neg Hx   . Atopy Neg Hx   . Eczema Neg Hx   . Immunodeficiency Neg Hx   . Urticaria Neg Hx     Her Social History Is Significant For: Social History   Socioeconomic History  . Marital status: Married    Spouse name: Not on file  . Number of children: Not on file  . Years of education: Not on file  . Highest education level: Not on file  Occupational History  . Not on file  Social Needs  . Financial resource strain: Not on file  . Food insecurity:    Worry: Not on file    Inability: Not on file  . Transportation needs:    Medical: Not on file    Non-medical: Not on file  Tobacco Use  . Smoking status: Never Smoker  . Smokeless tobacco: Never Used   Substance and Sexual Activity  . Alcohol use: No    Alcohol/week: 0.0 standard drinks  . Drug use: No  . Sexual activity: Not on file  Lifestyle  . Physical activity:    Days per week: Not on file    Minutes per session: Not on file  . Stress: Not on file  Relationships  . Social connections:    Talks on phone: Not on file    Gets together: Not on file    Attends religious service: Not on file    Active member of club or organization: Not on file    Attends meetings of clubs or organizations: Not on file    Relationship status: Not on file  Other Topics Concern  .  Not on file  Social History Narrative  . Not on file    Her Allergies Are:  Allergies  Allergen Reactions  . Prednisone Other (See Comments)    Patient reports having very bad mood swings and was very angry when on this drug  . Robitussin (Alcohol Free)  [Guaifenesin] Swelling  . Sulfa Antibiotics Swelling  . Amoxicillin Diarrhea  :   Her Current Medications Are:  Outpatient Encounter Medications as of 01/21/2019  Medication Sig  . albuterol (PROVENTIL HFA;VENTOLIN HFA) 108 (90 Base) MCG/ACT inhaler INHALE 2 PUFFS INTO THE LUNGS EVERY 4 (FOUR) HOURS AS NEEDED FOR WHEEZING OR SHORTNESS OF BREATH.  Marland Kitchen azelastine (ASTELIN) 0.1 % nasal spray Place 1 spray into both nostrils daily. Use in each nostril as directed  . cetirizine (ZYRTEC) 10 MG tablet Take 10 mg by mouth daily as needed.   Marland Kitchen Dexlansoprazole 30 MG capsule Take 30 mg by mouth daily as needed.  . Eluxadoline (VIBERZI) 75 MG TABS Take 75 mg by mouth daily.  Marland Kitchen EPINEPHrine (EPIPEN 2-PAK) 0.3 mg/0.3 mL IJ SOAJ injection Inject 0.3 mLs (0.3 mg total) into the muscle as needed (for severe life-threatening allergic reaction).  Marland Kitchen FLUoxetine (PROZAC) 10 MG capsule Take 20 mg by mouth daily.   . fluticasone (FLONASE) 50 MCG/ACT nasal spray Place 1 spray into both nostrils daily.  Marland Kitchen guaiFENesin-codeine (ROBITUSSIN AC) 100-10 MG/5ML syrup Take 5 mLs by mouth 3 (three)  times daily as needed for cough.  . levocetirizine (XYZAL) 5 MG tablet Take 1 tablet (5 mg total) by mouth daily as needed. Reported on 03/05/2016  . montelukast (SINGULAIR) 10 MG tablet Take 1 tablet (10 mg total) by mouth daily as needed.  . zolpidem (AMBIEN) 10 MG tablet Take 1 tablet by mouth as needed.   No facility-administered encounter medications on file as of 01/21/2019.   :  Review of Systems:  Out of a complete 14 point review of systems, all are reviewed and negative with the exception of these symptoms as listed below:  Review of Systems  Neurological:       Pt presents today to discuss her cpap. Pt reports that her cpap is going well.    Objective:  Neurological Exam  Physical Exam Physical Examination:   Vitals:   01/21/19 1259  BP: 124/81  Pulse: 79  Temp: 98.5 F (36.9 C)   General Examination: The patient is a very pleasant 63 y.o. female in no acute distress. She appears well-developed and well-nourished and well groomed.   HEENT:Normocephalic, atraumatic, pupils are equal, round and reactive to light and accommodation. Extraocular tracking is good without limitation to gaze excursion or nystagmus noted. Normal smooth pursuit is noted. Hearing is grossly intact. Face is symmetric with normal facial animation and normal facial sensation. Speech is clear with no dysarthria noted. There is no hypophonia. There is no lip, neck/head, jaw or voice tremor. Oropharynx exam reveals: mildmouth dryness, adequatedental hygiene and moderateairway crowding. Tongue protrudes centrally and palate elevates symmetrically.   Chest:Clear to auscultation without wheezing, rhonchi or crackles noted.  Heart:S1+S2+0, regular and normal without murmurs, rubs or gallops noted.   Abdomen:Soft, non-tender and non-distended with normal bowel sounds appreciated on auscultation.  Extremities:There isnopitting edema in the distal lower extremities bilaterally.  Skin: Warm and  dry without trophic changes noted. There are novaricose veins.  Musculoskeletal: exam reveals no obvious joint deformities, tenderness or joint swelling or erythema.   Neurologically: Mental status: The patient is awake, alert and oriented in  all 4 spheres.Herimmediate and remote memory, attention, language skills and fund of knowledge are appropriate. There is no evidence of aphasia, agnosia, apraxia or anomia. Speech is clear with normal prosody and enunciation. Thought process is linear. Mood is normaland affect is normal.  Cranial nerves II - XII are as described above under HEENT exam.  Motor exam: Normal bulk, strength and tone is noted. Finemotor skills and coordination: grossly intact.  Cerebellar testing: No dysmetria or intention tremor. There is no truncal or gait ataxia.  Sensory exam: intact to light touch in the upper and lower extremities.  Gait, station and balance:Shestands easily. No veering to one side is noted. No leaning to one side is noted. Posture is age-appropriate and stance is narrow based. Gait showsnormalstride length and normalpace. No problems turning are noted.   Assessmentand Plan:  In summary,De Smet H Tuttleis a very pleasant 52 year oldfemalewith an underlying medical history of hypertension, reflux disease, IBS with diarrhea, anxiety, depression, history of kidney stone, depression, allergic rhinitis, and obesity, who presents for Follow-up consultation of her obstructive sleep apnea after starting CPAP about 3 months ago. She had a split-night sleep study in October 2018 which confirmed severe obstructive sleep apnea. She was originally started on CPAP therapy but could not tolerate it at the time. She sought consultation with dentistry and ENT. She then decided to restart CPAP therapy last year. She has been successful in establishing treatment with CPAP. She felt really well the first month she reports. She is still compliant with treatment but  needs new supplies. I have ordered CPAP related supplies a we will fax this to her DME company. She reports that she has been trying to get new supplies for the past few weeks in fact. I would like to try to increase her treatment pressure by one centimeter to 11 cm to optimize her treatment and reduce her residual AHI. She would be willing to do this so long as she can get new supplies. She is doing well and reports benefit to her sleep, feels that she has slept better, in fact, she does not like to go to sleep without the machine. She is highly commended for her treatment adherence and advised to continue with full compliance, she can be seen in one year routinely.  I answered allherquestions today and the patient wasin agreement with the plan.  I spent 25 minutes in total face-to-face time with the patient, more than 50% of which was spent in counseling and coordination of care, reviewing test results, reviewing medication and discussing or reviewing the diagnosis of OSA, its prognosis and treatment options. Pertinent laboratory and imaging test results that were available during this visit with the patient were reviewed by me and considered in my medical decision making (see chart for details).

## 2019-01-21 NOTE — Progress Notes (Signed)
Order for cpap supplies sent to [employer] via community message. Confirmation received that the order transmitted was successful.  

## 2019-01-27 ENCOUNTER — Ambulatory Visit (INDEPENDENT_AMBULATORY_CARE_PROVIDER_SITE_OTHER): Payer: BLUE CROSS/BLUE SHIELD | Admitting: *Deleted

## 2019-01-27 DIAGNOSIS — J309 Allergic rhinitis, unspecified: Secondary | ICD-10-CM

## 2019-02-17 ENCOUNTER — Ambulatory Visit (INDEPENDENT_AMBULATORY_CARE_PROVIDER_SITE_OTHER): Payer: BLUE CROSS/BLUE SHIELD | Admitting: *Deleted

## 2019-02-17 DIAGNOSIS — J309 Allergic rhinitis, unspecified: Secondary | ICD-10-CM | POA: Diagnosis not present

## 2019-03-10 ENCOUNTER — Encounter: Payer: Self-pay | Admitting: Allergy & Immunology

## 2019-03-10 ENCOUNTER — Ambulatory Visit: Payer: BLUE CROSS/BLUE SHIELD | Admitting: Allergy & Immunology

## 2019-03-10 ENCOUNTER — Other Ambulatory Visit: Payer: Self-pay

## 2019-03-10 VITALS — BP 122/80 | HR 89 | Temp 98.0°F | Resp 16

## 2019-03-10 DIAGNOSIS — J302 Other seasonal allergic rhinitis: Secondary | ICD-10-CM

## 2019-03-10 DIAGNOSIS — J3089 Other allergic rhinitis: Secondary | ICD-10-CM | POA: Diagnosis not present

## 2019-03-10 DIAGNOSIS — J4520 Mild intermittent asthma, uncomplicated: Secondary | ICD-10-CM

## 2019-03-10 MED ORDER — ALBUTEROL SULFATE HFA 108 (90 BASE) MCG/ACT IN AERS: 2.0000 | INHALATION_SPRAY | RESPIRATORY_TRACT | 1 refills | Status: DC | PRN

## 2019-03-10 MED ORDER — CETIRIZINE HCL 0.24 % OP SOLN: 1.0000 [drp] | Freq: Two times a day (BID) | OPHTHALMIC | 4 refills | Status: DC | PRN

## 2019-03-10 NOTE — Patient Instructions (Addendum)
1. Perennial and seasonal allergic rhinitis - Continue with allergy shots at the same schedule.  - Stop the Zyrtec and start Xyzal 5mg  up to twice daily (samples provided). - Start Zerviate one drop per eye twice daily (samples provided).  - We are going to get an environmental allergy panel to see what your current sensitizations are at this point. - We are also going to hunt down your original testing and reorder your shots.   2. Mild intermittent asthma, uncomplicated - We did not do testing since this can spread the coronavirus very efficiently.  - Continue with albuterol four puffs every 4-6 hours as needed for coughing/wheezing.  3. Return in about 3 months (around 06/10/2019).   Please inform us of any Emergency Department visits, hospitalizations, or changes in symptoms. Call us before going to the ED for breathing or allergy symptoms since we might be able to fit you in for a sick visit. Feel free to contact us anytime with any questions, problems, or concerns.  It was a pleasure to see you again today!   Websites that have reliable patient information: 1. American Academy of Asthma, Allergy, and Immunology: www.aaaai.org 2. Food Allergy Research and Education (FARE): foodallergy.org 3. Mothers of Asthmatics: http://www.asthmacommunitynetwork.org 4. American College of Allergy, Asthma, and Immunology: MissingWeapons.ca   Make sure you are registered to vote! If you have moved or changed any of your contact information, you will need to get this updated before voting!

## 2019-03-10 NOTE — Progress Notes (Signed)
FOLLOW UP  Date of Service/Encounter:  03/10/19   Assessment:   Mild intermittent asthma, uncomplicated  Seasonal and perennial allergic rhinitis  Plan/Recommendations:   1. Perennial and seasonal allergic rhinitis - Continue with allergy shots at the same schedule.  - Stop the Zyrtec and start Xyzal 5mg  up to twice daily (samples provided). - Start Zerviate one drop per eye twice daily (samples provided).  - We are going to get an environmental allergy panel to see what your current sensitizations are at this point. - We are also going to hunt down your original testing and reorder your shots.   2. Mild intermittent asthma, uncomplicated - We did not do testing since this can spread the coronavirus very efficiently.  - Continue with albuterol four puffs every 4-6 hours as needed for coughing/wheezing.  3. Return in about 3 months (around 06/10/2019).  Subjective:   Brooke Farrell is a 63 y.o. female presenting today for follow up of  Chief Complaint  Patient presents with  . Allergic Rhinitis   . Asthma    NAJAI JOLIVET has a history of the following: Patient Active Problem List   Diagnosis Date Noted  . Seasonal and perennial allergic rhinitis 08/26/2018  . Mild intermittent asthma, uncomplicated 08/26/2018  . Allergic rhinoconjunctivitis 07/27/2015  . Cough     History obtained from: chart review and patient.  Brooke Farrell is a 63 y.o. female presenting for a sick visit.  She was last seen in November 2019.  At that time, allergies were under good control with allergen immunotherapy.  Her asthma was not under great control.  Her spirometry did look better but she continued to cough.  We gave her a sample of Symbicort to use 2 puffs twice daily for 2 weeks or so until she was feeling better.  We did refill her cough medicine.  We also continued her on albuterol 4 puffs every 4-6 hours as needed.  Since the last visit, she has mostly done well. She did spend some time  outdoors this past weekend and developed constant sneezing and rhinorrhea.  She did have taking up to 4 Benadryl on Saturday night.  She found some prednisone that I had given her from another exacerbation last fall and took those.  It was 2 tablets in total.  Finally she felt better after this.  She tells me today that she is unsure that her allergy shots are working any longer.  She has been on Zyrtec which she takes every day.  She has been on this for several years now.  She also would like some eyedrops today.  We did review her allergen therapy prescription.  It was originally ordered by Dr. Beaulah Dinning and has not been changed. One vial contains weeds, dog, dust mite, and mold. The other vial contains grass and tree.  Her asthma, however, is well controlled.  Last testing was in 2011 (see below):    Otherwise, there have been no changes to her past medical history, surgical history, family history, or social history.    Review of Systems  Constitutional: Negative.  Negative for chills, fever, malaise/fatigue and weight loss.  HENT: Negative.  Negative for congestion, ear discharge and ear pain.   Eyes: Negative for pain, discharge and redness.  Respiratory: Negative for cough, sputum production, shortness of breath and wheezing.   Cardiovascular: Negative.  Negative for chest pain and palpitations.  Gastrointestinal: Negative for abdominal pain, heartburn, nausea and vomiting.  Skin: Negative.  Negative for  itching and rash.  Neurological: Negative for dizziness and headaches.  Endo/Heme/Allergies: Negative for environmental allergies. Does not bruise/bleed easily.       Objective:   Blood pressure 122/80, pulse 89, temperature 98 F (36.7 C), temperature source Tympanic, resp. rate 16, SpO2 95 %. There is no height or weight on file to calculate BMI.   Physical Exam:  Physical Exam  Constitutional: She appears well-developed.  Pleasant female.   HENT:  Head:  Normocephalic and atraumatic.  Right Ear: Tympanic membrane, external ear and ear canal normal.  Left Ear: Tympanic membrane, external ear and ear canal normal.  Nose: Mucosal edema and rhinorrhea present. No nasal deformity or septal deviation. No epistaxis. Right sinus exhibits no maxillary sinus tenderness and no frontal sinus tenderness. Left sinus exhibits no maxillary sinus tenderness and no frontal sinus tenderness.  Mouth/Throat: Uvula is midline and oropharynx is clear and moist. Mucous membranes are not pale and not dry.  Cobblestoning present in the posterior oropharynx.  Eyes: Pupils are equal, round, and reactive to light. Conjunctivae and EOM are normal. Right eye exhibits no chemosis and no discharge. Left eye exhibits no chemosis and no discharge. Right conjunctiva is not injected. Left conjunctiva is not injected.  Cardiovascular: Normal rate, regular rhythm and normal heart sounds.  Respiratory: Effort normal and breath sounds normal. No accessory muscle usage. No tachypnea. No respiratory distress. She has no wheezes. She has no rhonchi. She has no rales. She exhibits no tenderness.  Moving air well in all lung fields.  Lymphadenopathy:    She has no cervical adenopathy.  Neurological: She is alert.  Skin: No abrasion, no petechiae and no rash noted. Rash is not papular, not vesicular and not urticarial. No erythema. No pallor.  Psychiatric: She has a normal mood and affect.     Diagnostic studies: none      Brooke BondsJoel Gallagher, MD  [facility]

## 2019-03-12 LAB — IGE+ALLERGENS ZONE 2(30)
Alternaria Alternata IgE: <0.1 kU/L
Amer Sycamore IgE Qn: 0.45 kU/L — AB
Aspergillus Fumigatus IgE: <0.1 kU/L
Bahia Grass IgE: 7.69 kU/L — AB
Bermuda Grass IgE: 3.74 kU/L — AB
Cat Dander IgE: 0.38 kU/L — AB
Cedar, Mountain IgE: 13.9 kU/L — AB
Cladosporium Herbarum IgE: <0.1 kU/L
Cockroach, American IgE: 0.88 kU/L — AB
Common Silver Birch IgE: 1.95 kU/L — AB
D Farinae IgE: 0.65 kU/L — AB
D Pteronyssinus IgE: 0.38 kU/L — AB
Dog Dander IgE: 1.98 kU/L — AB
Elm, American IgE: 0.83 kU/L — AB
Hickory, White IgE: 1.74 kU/L — AB
IgE (Immunoglobulin E), Serum: 177 IU/mL (ref 6–495)
Johnson Grass IgE: 6.96 kU/L — AB
Maple/Box Elder IgE: 1.24 kU/L — AB
Mucor Racemosus IgE: <0.1 kU/L
Mugwort IgE Qn: 0.79 kU/L — AB
Nettle IgE: 0.63 kU/L — AB
Oak, White IgE: 2.64 kU/L — AB
Penicillium Chrysogen IgE: <0.1 kU/L
Pigweed, Rough IgE: 0.76 kU/L — AB
Plantain, English IgE: 0.5 kU/L — AB
Ragweed, Short IgE: 5.9 kU/L — AB
Sheep Sorrel IgE Qn: 0.61 kU/L — AB
Stemphylium Herbarum IgE: <0.1 kU/L
Sweet gum IgE RAST Ql: 0.79 kU/L — AB
Timothy Grass IgE: 18.9 kU/L — AB
White Mulberry IgE: 0.45 kU/L — AB

## 2019-03-17 ENCOUNTER — Other Ambulatory Visit: Payer: Self-pay

## 2019-03-17 ENCOUNTER — Ambulatory Visit: Payer: BLUE CROSS/BLUE SHIELD | Admitting: Allergy & Immunology

## 2019-03-24 ENCOUNTER — Ambulatory Visit: Payer: BLUE CROSS/BLUE SHIELD | Admitting: Allergy & Immunology

## 2019-03-31 ENCOUNTER — Ambulatory Visit (INDEPENDENT_AMBULATORY_CARE_PROVIDER_SITE_OTHER): Payer: BLUE CROSS/BLUE SHIELD

## 2019-03-31 DIAGNOSIS — J309 Allergic rhinitis, unspecified: Secondary | ICD-10-CM | POA: Diagnosis not present

## 2019-04-07 ENCOUNTER — Other Ambulatory Visit: Payer: Self-pay

## 2019-04-07 ENCOUNTER — Ambulatory Visit: Payer: BLUE CROSS/BLUE SHIELD | Admitting: Allergy & Immunology

## 2019-04-07 ENCOUNTER — Encounter: Payer: Self-pay | Admitting: Allergy & Immunology

## 2019-04-07 ENCOUNTER — Ambulatory Visit (INDEPENDENT_AMBULATORY_CARE_PROVIDER_SITE_OTHER): Payer: BLUE CROSS/BLUE SHIELD | Admitting: Allergy & Immunology

## 2019-04-07 VITALS — BP 126/74 | HR 70 | Temp 97.5°F | Resp 17

## 2019-04-07 DIAGNOSIS — J4520 Mild intermittent asthma, uncomplicated: Secondary | ICD-10-CM | POA: Diagnosis not present

## 2019-04-07 DIAGNOSIS — J302 Other seasonal allergic rhinitis: Secondary | ICD-10-CM

## 2019-04-07 DIAGNOSIS — J3089 Other allergic rhinitis: Secondary | ICD-10-CM | POA: Diagnosis not present

## 2019-04-07 NOTE — Patient Instructions (Addendum)
1. Perennial and seasonal allergic rhinitis (dust mites, cat, dog, grasses, cockroach, trees, ragweed, indoor molds, and weeds) - Testing was slightly reactive to indoor molds.  - We are going to remix your allergy vials. - Make an appointment in two weeks to start.  - We are going to be somewhat aggressive with the buildup, although we are adding some new items to the vials (cat) and changing what is mixed today so we have to start from the beginning.  - Continue with Xyzal 5mg  up to twice daily. - Continue with Zerviate one drop per eye twice daily (additional samples provided).  - We will send in the script again to see if the copay card works better.   2. Mild intermittent asthma, uncomplicated - We did not do testing since this can spread the coronavirus very efficiently.  - Continue with albuterol four puffs every 4-6 hours as needed for coughing/wheezing.  3. Return in about 3 months (around 07/08/2019).   Please inform us of any Emergency Department visits, hospitalizations, or changes in symptoms. Call us before going to the ED for breathing or allergy symptoms since we might be able to fit you in for a sick visit. Feel free to contact us anytime with any questions, problems, or concerns.  It was a pleasure to see you again today!   Websites that have reliable patient information: 1. American Academy of Asthma, Allergy, and Immunology: www.aaaai.org 2. Food Allergy Research and Education (FARE): foodallergy.org 3. Mothers of Asthmatics: http://www.asthmacommunitynetwork.org 4. American College of Allergy, Asthma, and Immunology: MissingWeapons.ca   Make sure you are registered to vote! If you have moved or changed any of your contact information, you will need to get this updated before voting!

## 2019-04-07 NOTE — Progress Notes (Signed)
FOLLOW UP  Date of Service/Encounter:  04/07/19   Assessment:   Seasonal and perennial allergic rhinitis (grasses, weeds, ragweed, trees, indoor mold, dust mites, cats, dogs, roach)  Mild intermittent asthma, uncomplicated  Plan/Recommendations:   1. Perennial and seasonal allergic rhinitis (dust mites, cat, dog, grasses, cockroach, trees, ragweed, indoor molds, and weeds) - Testing was slightly reactive to indoor molds.  - We are going to remix your allergy vials. - Make an appointment in two weeks to start.  - We are going to be somewhat aggressive with the buildup, although we are adding some new items to the vials (cat) and changing what is mixed today so we have to start from the beginning.  - Continue with Xyzal 5mg  up to twice daily. - Continue with Zerviate one drop per eye twice daily (additional samples provided).  - We will send in the script again to see if the copay card works better.   2. Mild intermittent asthma, uncomplicated - We did not do testing since this can spread the coronavirus very efficiently.  - Continue with albuterol four puffs every 4-6 hours as needed for coughing/wheezing.  3. Return in about 3 months (around 07/08/2019).  Subjective:   Brooke Farrell is a 63 y.o. female presenting today for follow up of  Chief Complaint  Patient presents with  . Allergy Testing    SALVATRICE SUPPLE has a history of the following: Patient Active Problem List   Diagnosis Date Noted  . Seasonal and perennial allergic rhinitis 08/26/2018  . Mild intermittent asthma, uncomplicated 08/26/2018  . Allergic rhinoconjunctivitis 07/27/2015  . Cough     History obtained from: chart review and patient.  Brooke Farrell is a 63 y.o. female presenting for skin testing.  She was last seen in May 2020.  At that time, continued with her allergy shots at the same schedule.  We stop Zyrtec and started Xyzal 5 mg twice daily.  We started different eyedrops.  We also continued her  discussions about re-mixing her vials.  We obtained an environmental allergy panel to check her allergy levels are hanging out.  She was positive to dust mites, cat, dog, grasses, cockroach, trees, ragweed, and weeds.  She did not have cats in her previous files.  However, she had endorsed increased symptoms with periods of heavy rain, so I felt that molds must be contributing to her symptoms.  Therefore we brought her in for skin testing to our mold panel.  Since last visit, she has largely done well.  She did stop her antihistamines as directed.  She is complaining of pain on her bottom since she did slip she was outdoors in the last week or so.  She is able to walk fine and is not afraid of any broken bones, but she tells me that her buttock is still sore.  Otherwise, there have been no changes to her past medical history, surgical history, family history, or social history.    Review of Systems  Constitutional: Negative.  Negative for fever, malaise/fatigue and weight loss.  HENT: Positive for congestion and sinus pain. Negative for ear discharge and ear pain.   Eyes: Negative for pain, discharge and redness.  Respiratory: Negative for cough, sputum production, shortness of breath and wheezing.   Cardiovascular: Negative.  Negative for chest pain and palpitations.  Gastrointestinal: Negative for abdominal pain and heartburn.  Skin: Negative.  Negative for itching and rash.  Neurological: Negative for dizziness and headaches.  Endo/Heme/Allergies: Positive for environmental  allergies. Does not bruise/bleed easily.       Objective:   Blood pressure 126/74, pulse 70, temperature (!) 97.5 F (36.4 C), resp. rate 17, SpO2 94 %. There is no height or weight on file to calculate BMI.   Physical Exam:  Physical Exam  Constitutional: She appears well-developed.  HENT:  Head: Normocephalic and atraumatic.  Right Ear: Tympanic membrane, external ear and ear canal normal.  Left Ear:  Tympanic membrane and ear canal normal.  Nose: Mucosal edema present. No rhinorrhea, nasal deformity or septal deviation. No epistaxis. Right sinus exhibits no maxillary sinus tenderness and no frontal sinus tenderness. Left sinus exhibits no maxillary sinus tenderness and no frontal sinus tenderness.  Mouth/Throat: Uvula is midline and oropharynx is clear and moist. Mucous membranes are not pale and not dry.  There is some mild cobblestoning in the posterior oropharynx.  Eyes: Pupils are equal, round, and reactive to light. Conjunctivae and EOM are normal. Right eye exhibits no chemosis and no discharge. Left eye exhibits no chemosis and no discharge. Right conjunctiva is not injected. Left conjunctiva is not injected.  Cardiovascular: Normal rate, regular rhythm and normal heart sounds.  Respiratory: Effort normal and breath sounds normal. No accessory muscle usage. No tachypnea. No respiratory distress. She has no wheezes. She has no rhonchi. She has no rales. She exhibits no tenderness.  Moving air very well in all lung fields.  Lymphadenopathy:    She has no cervical adenopathy.  Neurological: She is alert.  Skin: No abrasion, no petechiae and no rash noted. Rash is not papular, not vesicular and not urticarial. No erythema. No pallor.  Psychiatric: She has a normal mood and affect.     Diagnostic studies:     Allergy Studies:    Airborne Adult Perc - 04/07/19 0930    Time Antigen Placed  0930    Allergen Manufacturer  Waynette ButteryGreer    Location  [facility]    Number of Test  17    Panel 1  Select    1. Control-Buffer 50% Glycerol  Negative    2. Control-Histamine 1 mg/ml  3+    36. Alternaria alternata  Negative    37. Cladosporium Herbarum  Negative    38. Aspergillus mix  Negative    39. Penicillium mix  Negative    40. Bipolaris sorokiniana (Helminthosporium)  Negative    41. Drechslera spicifera (Curvularia)  Negative    42. Mucor plumbeus  Negative    43. Fusarium moniliforme  Negative     44. Aureobasidium pullulans (pullulara)  Negative    45. Rhizopus oryzae  Negative    46. Botrytis cinera  Negative    47. Epicoccum nigrum  Negative    48. Phoma betae  Negative    49. Candida Albicans  Negative    50. Trichophyton mentagrophytes  Negative     Intradermal - 04/07/19 0945    Time Antigen Placed  0945    Allergen Manufacturer  Waynette ButteryGreer    Location  [facility]    Number of Test  5    Intradermal  Select    Control  Negative    Mold 1  Negative    Mold 2  1+    Mold 3  Negative    Mold 4  1+       Allergy testing results were read and interpreted by myself, documented by clinical staff.      Malachi BondsJoel Kinston Magnan, MD  [facility]

## 2019-04-07 NOTE — Progress Notes (Signed)
VIALS EXP 04-06-2020 

## 2019-04-08 DIAGNOSIS — J3089 Other allergic rhinitis: Secondary | ICD-10-CM

## 2019-04-28 ENCOUNTER — Ambulatory Visit (INDEPENDENT_AMBULATORY_CARE_PROVIDER_SITE_OTHER): Payer: BC Managed Care – PPO

## 2019-04-28 ENCOUNTER — Other Ambulatory Visit: Payer: Self-pay

## 2019-04-28 DIAGNOSIS — J309 Allergic rhinitis, unspecified: Secondary | ICD-10-CM

## 2019-04-28 NOTE — Progress Notes (Signed)
Immunotherapy   Patient Details  Name: Brooke Farrell MRN: [id number] Date of Birth: 12-01-1955  04/28/2019  Darden Palmer started her new allergy injections. Patient received 0.05 of both her blue vials. One with G-W-T-C-D and the other with Molds-DM-CR-RW. Patient waited in an exam room for 30 minutes with no problem. Following schedule: B Frequency: 1-2 times weekly Epi-Pen: Yes Consent signed and patient instructions given.   Herbie Drape 04/28/2019, 9:48 AM

## 2019-05-05 ENCOUNTER — Ambulatory Visit (INDEPENDENT_AMBULATORY_CARE_PROVIDER_SITE_OTHER): Payer: BC Managed Care – PPO

## 2019-05-05 DIAGNOSIS — J309 Allergic rhinitis, unspecified: Secondary | ICD-10-CM

## 2019-06-02 ENCOUNTER — Other Ambulatory Visit: Payer: Self-pay

## 2019-06-02 ENCOUNTER — Ambulatory Visit (INDEPENDENT_AMBULATORY_CARE_PROVIDER_SITE_OTHER): Payer: BC Managed Care – PPO

## 2019-06-02 DIAGNOSIS — J309 Allergic rhinitis, unspecified: Secondary | ICD-10-CM

## 2019-06-10 ENCOUNTER — Ambulatory Visit (INDEPENDENT_AMBULATORY_CARE_PROVIDER_SITE_OTHER): Payer: BC Managed Care – PPO | Admitting: *Deleted

## 2019-06-10 DIAGNOSIS — J309 Allergic rhinitis, unspecified: Secondary | ICD-10-CM

## 2019-06-10 DIAGNOSIS — M25552 Pain in left hip: Secondary | ICD-10-CM | POA: Insufficient documentation

## 2019-06-11 ENCOUNTER — Ambulatory Visit: Payer: BLUE CROSS/BLUE SHIELD | Admitting: Allergy & Immunology

## 2019-06-16 ENCOUNTER — Ambulatory Visit (INDEPENDENT_AMBULATORY_CARE_PROVIDER_SITE_OTHER): Payer: BC Managed Care – PPO

## 2019-06-16 DIAGNOSIS — J309 Allergic rhinitis, unspecified: Secondary | ICD-10-CM

## 2019-06-23 ENCOUNTER — Ambulatory Visit (INDEPENDENT_AMBULATORY_CARE_PROVIDER_SITE_OTHER): Payer: BC Managed Care – PPO

## 2019-06-23 DIAGNOSIS — J309 Allergic rhinitis, unspecified: Secondary | ICD-10-CM

## 2019-06-30 ENCOUNTER — Ambulatory Visit (INDEPENDENT_AMBULATORY_CARE_PROVIDER_SITE_OTHER): Payer: BC Managed Care – PPO | Admitting: *Deleted

## 2019-06-30 DIAGNOSIS — J309 Allergic rhinitis, unspecified: Secondary | ICD-10-CM

## 2019-07-09 ENCOUNTER — Ambulatory Visit: Payer: BLUE CROSS/BLUE SHIELD | Admitting: Allergy & Immunology

## 2019-07-14 ENCOUNTER — Ambulatory Visit (INDEPENDENT_AMBULATORY_CARE_PROVIDER_SITE_OTHER): Payer: BC Managed Care – PPO

## 2019-07-14 DIAGNOSIS — J309 Allergic rhinitis, unspecified: Secondary | ICD-10-CM | POA: Diagnosis not present

## 2019-07-21 ENCOUNTER — Ambulatory Visit (INDEPENDENT_AMBULATORY_CARE_PROVIDER_SITE_OTHER): Payer: BC Managed Care – PPO | Admitting: *Deleted

## 2019-07-21 DIAGNOSIS — J309 Allergic rhinitis, unspecified: Secondary | ICD-10-CM

## 2019-07-28 ENCOUNTER — Ambulatory Visit (INDEPENDENT_AMBULATORY_CARE_PROVIDER_SITE_OTHER): Payer: BC Managed Care – PPO

## 2019-07-28 DIAGNOSIS — J309 Allergic rhinitis, unspecified: Secondary | ICD-10-CM

## 2019-08-04 ENCOUNTER — Ambulatory Visit (INDEPENDENT_AMBULATORY_CARE_PROVIDER_SITE_OTHER): Payer: BC Managed Care – PPO

## 2019-08-04 DIAGNOSIS — J309 Allergic rhinitis, unspecified: Secondary | ICD-10-CM | POA: Diagnosis not present

## 2019-08-13 ENCOUNTER — Other Ambulatory Visit: Payer: Self-pay

## 2019-08-13 ENCOUNTER — Encounter: Payer: Self-pay | Admitting: Allergy & Immunology

## 2019-08-13 ENCOUNTER — Ambulatory Visit: Payer: BC Managed Care – PPO | Admitting: Allergy & Immunology

## 2019-08-13 ENCOUNTER — Ambulatory Visit: Payer: Self-pay

## 2019-08-13 VITALS — BP 130/82 | HR 77 | Temp 97.9°F | Resp 17 | Ht 69.0 in | Wt 207.8 lb

## 2019-08-13 DIAGNOSIS — J302 Other seasonal allergic rhinitis: Secondary | ICD-10-CM

## 2019-08-13 DIAGNOSIS — J4520 Mild intermittent asthma, uncomplicated: Secondary | ICD-10-CM

## 2019-08-13 DIAGNOSIS — J3089 Other allergic rhinitis: Secondary | ICD-10-CM | POA: Diagnosis not present

## 2019-08-13 DIAGNOSIS — J309 Allergic rhinitis, unspecified: Secondary | ICD-10-CM

## 2019-08-13 NOTE — Patient Instructions (Addendum)
1. Perennial and seasonal allergic rhinitis (dust mites, cat, dog, grasses, cockroach, trees, ragweed, indoor molds, and weeds) - Continue with shots at the same schedule, hopefully this new set of vials will work well.  - Continue with Xyzal 5mg  up to twice daily. - Continue with Zerviate one drop per eye twice daily (additional samples provided).  - We will send in the script again to see if the copay card works better.   2. Mild intermittent asthma, uncomplicated - We did not do testing since this can spread the coronavirus very efficiently.  - Continue with albuterol four puffs every 4-6 hours as needed for coughing/wheezing.  3. Return in about 6 months (around 02/11/2020).   Please inform us of any Emergency Department visits, hospitalizations, or changes in symptoms. Call us before going to the ED for breathing or allergy symptoms since we might be able to fit you in for a sick visit. Feel free to contact us anytime with any questions, problems, or concerns.  It was a pleasure to see you again today!   Websites that have reliable patient information: 1. American Academy of Asthma, Allergy, and Immunology: www.aaaai.org 2. Food Allergy Research and Education (FARE): foodallergy.org 3. Mothers of Asthmatics: http://www.asthmacommunitynetwork.org 4. American College of Allergy, Asthma, and Immunology: MonthlyElectricBill.co.uk   Make sure you are registered to vote! If you have moved or changed any of your contact information, you will need to get this updated before voting!

## 2019-08-13 NOTE — Progress Notes (Signed)
FOLLOW UP  Date of Service/Encounter:  08/13/19   Assessment:   Seasonal and perennial allergic rhinitis (grasses, weeds, ragweed, trees, indoor mold, dust mites, cats, dogs, roach) - doing well on the re-mixed allergy vials  Mild intermittent asthma, uncomplicated   Plan/Recommendations:   1. Perennial and seasonal allergic rhinitis (dust mites, cat, dog, grasses, cockroach, trees, ragweed, indoor molds, and weeds) - Continue with shots at the same schedule, hopefully this new set of vials will work well.  - Continue with Xyzal 5mg  up to twice daily. - Continue with Zerviate one drop per eye twice daily (additional samples provided).  - We will send in the script again to see if the copay card works better.   2. Mild intermittent asthma, uncomplicated - We did not do testing since this can spread the coronavirus very efficiently.  - Continue with albuterol four puffs every 4-6 hours as needed for coughing/wheezing.  3. Return in about 6 months (around 02/11/2020).     Subjective:   Brooke Farrell is a 63 y.o. female presenting today for follow up of  Chief Complaint  Patient presents with   Asthma    Brooke Farrell has a history of the following: Patient Active Problem List   Diagnosis Date Noted   Seasonal and perennial allergic rhinitis 08/26/2018   Mild intermittent asthma, uncomplicated 29/79/8921   Allergic rhinoconjunctivitis 07/27/2015   Cough     History obtained from: chart review and patient.  Brooke Farrell is a 63 y.o. female presenting for a follow up visit.  She was last seen in June 2020.  At that time, we did retest her so that we could do new allergen vials.  We continued Xyzal as well as Zerviate eyedrops.  For her asthma, she was under good control with albuterol as needed.  Since last visit, she has done very well.  She feels that the remixed allergy shots are helping with her symptoms.  She has been tolerating the advance well.  She did have  an episode a couple of weeks ago where she developed some ocular itching and runny nose on the day of her shot.  This was treated with an extra dose of Xyzal and Benadryl.  She does not have any breathing difficulties.  She does feel that the Zerviate eyedrops have helped with a lot of her symptoms, unfortunately the co-pay was $45 and she did not want to pay that.  She is going to wait till next year to see if the coverage is better.  Her asthma has been under great control.  She said she normally has dealt with a rattle in her upper chest, but this is not been an issue since we changed the allergy shots.  She has been very careful about her exposures due to the ongoing coronavirus pandemic.  She has gotten her flu shot.  Otherwise, there have been no changes to her past medical history, surgical history, family history, or social history.    Review of Systems  Constitutional: Negative.  Negative for chills, fever, malaise/fatigue and weight loss.  HENT: Negative.  Negative for congestion, ear discharge, ear pain, sinus pain and sore throat.   Eyes: Negative for blurred vision, pain, discharge and redness.  Respiratory: Negative for cough, sputum production, shortness of breath and wheezing.   Cardiovascular: Negative.  Negative for chest pain and palpitations.  Gastrointestinal: Negative for abdominal pain, constipation, diarrhea, heartburn, nausea and vomiting.  Skin: Negative.  Negative for itching and rash.  Neurological: Negative for dizziness and headaches.  Endo/Heme/Allergies: Negative for environmental allergies. Does not bruise/bleed easily.       Objective:   Blood pressure 130/82, pulse 77, temperature 97.9 F (36.6 C), temperature source Temporal, resp. rate 17, height 5\' 9"  (1.753 m), weight 207 lb 12.8 oz (94.3 kg), SpO2 95 %. Body mass index is 30.69 kg/m.   Physical Exam:  Physical Exam  Constitutional: She appears well-developed.  Pleasant female.   HENT:  Head:  Normocephalic and atraumatic.  Right Ear: Tympanic membrane, external ear and ear canal normal.  Left Ear: Tympanic membrane, external ear and ear canal normal.  Nose: Mucosal edema present. No rhinorrhea, nasal deformity or septal deviation. No epistaxis. Right sinus exhibits no maxillary sinus tenderness and no frontal sinus tenderness. Left sinus exhibits no maxillary sinus tenderness and no frontal sinus tenderness.  Mouth/Throat: Uvula is midline and oropharynx is clear and moist. Mucous membranes are not pale and not dry.  No cobblestoning in the posterior oropharynx.   Eyes: Pupils are equal, round, and reactive to light. Conjunctivae and EOM are normal. Right eye exhibits no chemosis and no discharge. Left eye exhibits no chemosis and no discharge. Right conjunctiva is not injected. Left conjunctiva is not injected.  Cardiovascular: Normal rate, regular rhythm and normal heart sounds.  Respiratory: Effort normal and breath sounds normal. No accessory muscle usage. No tachypnea. No respiratory distress. She has no wheezes. She has no rhonchi. She has no rales. She exhibits no tenderness.  Moving air well in all lung fields. No increased work of breathing.   Lymphadenopathy:    She has no cervical adenopathy.  Neurological: She is alert.  Skin: No abrasion, no petechiae and no rash noted. Rash is not papular, not vesicular and not urticarial. No erythema. No pallor.  Psychiatric: She has a normal mood and affect.     Diagnostic studies:    Spirometry: results normal (FEV1: 2.28/87%, FVC: 3.18/89%, FEV1/FVC: 71%).    Spirometry consistent with normal pattern.   Allergy Studies:             02-17-1999, MD  [facility]

## 2019-08-18 ENCOUNTER — Ambulatory Visit (INDEPENDENT_AMBULATORY_CARE_PROVIDER_SITE_OTHER): Payer: BC Managed Care – PPO

## 2019-08-18 DIAGNOSIS — J309 Allergic rhinitis, unspecified: Secondary | ICD-10-CM

## 2019-08-27 ENCOUNTER — Ambulatory Visit (INDEPENDENT_AMBULATORY_CARE_PROVIDER_SITE_OTHER): Payer: BC Managed Care – PPO

## 2019-08-27 DIAGNOSIS — J309 Allergic rhinitis, unspecified: Secondary | ICD-10-CM

## 2019-09-01 ENCOUNTER — Ambulatory Visit (INDEPENDENT_AMBULATORY_CARE_PROVIDER_SITE_OTHER): Payer: BC Managed Care – PPO | Admitting: *Deleted

## 2019-09-01 DIAGNOSIS — J309 Allergic rhinitis, unspecified: Secondary | ICD-10-CM | POA: Diagnosis not present

## 2019-09-08 ENCOUNTER — Ambulatory Visit (INDEPENDENT_AMBULATORY_CARE_PROVIDER_SITE_OTHER): Payer: BC Managed Care – PPO

## 2019-09-08 DIAGNOSIS — J309 Allergic rhinitis, unspecified: Secondary | ICD-10-CM

## 2019-09-14 DIAGNOSIS — F5101 Primary insomnia: Secondary | ICD-10-CM | POA: Insufficient documentation

## 2019-09-22 ENCOUNTER — Ambulatory Visit (INDEPENDENT_AMBULATORY_CARE_PROVIDER_SITE_OTHER): Payer: BC Managed Care – PPO

## 2019-09-22 DIAGNOSIS — J309 Allergic rhinitis, unspecified: Secondary | ICD-10-CM | POA: Diagnosis not present

## 2019-09-29 ENCOUNTER — Ambulatory Visit (INDEPENDENT_AMBULATORY_CARE_PROVIDER_SITE_OTHER): Payer: BC Managed Care – PPO

## 2019-09-29 DIAGNOSIS — J309 Allergic rhinitis, unspecified: Secondary | ICD-10-CM | POA: Diagnosis not present

## 2019-10-08 ENCOUNTER — Ambulatory Visit (INDEPENDENT_AMBULATORY_CARE_PROVIDER_SITE_OTHER): Payer: BC Managed Care – PPO

## 2019-10-08 ENCOUNTER — Other Ambulatory Visit: Payer: Self-pay

## 2019-10-08 DIAGNOSIS — J309 Allergic rhinitis, unspecified: Secondary | ICD-10-CM

## 2019-10-11 ENCOUNTER — Ambulatory Visit: Payer: BC Managed Care – PPO | Admitting: Obstetrics & Gynecology

## 2019-10-11 ENCOUNTER — Encounter: Payer: Self-pay | Admitting: Obstetrics & Gynecology

## 2019-10-11 ENCOUNTER — Other Ambulatory Visit: Payer: Self-pay

## 2019-10-11 VITALS — BP 128/78 | Ht 68.0 in | Wt 202.0 lb

## 2019-10-11 DIAGNOSIS — Z01419 Encounter for gynecological examination (general) (routine) without abnormal findings: Secondary | ICD-10-CM | POA: Diagnosis not present

## 2019-10-11 DIAGNOSIS — Z780 Asymptomatic menopausal state: Secondary | ICD-10-CM | POA: Diagnosis not present

## 2019-10-11 DIAGNOSIS — E669 Obesity, unspecified: Secondary | ICD-10-CM

## 2019-10-11 NOTE — Addendum Note (Signed)
Addended by: Thurnell Garbe A on: 10/11/2019 11:44 AM   Modules accepted: Orders

## 2019-10-11 NOTE — Progress Notes (Signed)
Brooke Farrell Dec 24, 1955 [id number]   History:    63 y.o. G3P1A2L1 Married.  Son has 2 children  RP:  Established patient presenting for Annual Gynecologic exam  HPI: Postmenopausal, well on no hormone replacement therapy.  No postmenopausal bleeding.  No pelvic pain.  Abstinent.  Urine and bowel movements normal.  Body mass index 30.71.  Limited in physical activity, waiting for a left hip replacement.  Health labs with family physician.   Past medical history,surgical history, family history and social history were all reviewed and documented in the EPIC chart.  Gynecologic History No LMP recorded. Patient is postmenopausal. Contraception: abstinence and post menopausal status Last Pap: 3 yrs ago normal at [facility] mammogram: 12/2017. Results were: normal per patient ([facility]) Bone Density: 4 yrs ago normal Colonoscopy: 2018  Obstetric History OB History  Gravida Para Term Preterm AB Living  3 1     2 1   SAB TAB Ectopic Multiple Live Births               # Outcome Date GA Lbr Len/2nd Weight Sex Delivery Anes PTL Lv  3 AB           2 AB           1 Para              ROS: A ROS was performed and pertinent positives and negatives are included in the history.  GENERAL: No fevers or chills. HEENT: No change in vision, no earache, sore throat or sinus congestion. NECK: No pain or stiffness. CARDIOVASCULAR: No chest pain or pressure. No palpitations. PULMONARY: No shortness of breath, cough or wheeze. GASTROINTESTINAL: No abdominal pain, nausea, vomiting or diarrhea, melena or bright red blood per rectum. GENITOURINARY: No urinary frequency, urgency, hesitancy or dysuria. MUSCULOSKELETAL: No joint or muscle pain, no back pain, no recent trauma. DERMATOLOGIC: No rash, no itching, no lesions. ENDOCRINE: No polyuria, polydipsia, no heat or cold intolerance. No recent change in weight. HEMATOLOGICAL: No anemia or easy bruising or bleeding. NEUROLOGIC: No headache, seizures,  numbness, tingling or weakness. PSYCHIATRIC: No depression, no loss of interest in normal activity or change in sleep pattern.     Exam:   Ht 5\' 8"  (1.727 m)    Wt 202 lb (91.6 kg)    BMI 30.71 kg/m   Body mass index is 30.71 kg/m.  General appearance : Well developed well nourished female. No acute distress HEENT: Eyes: no retinal hemorrhage or exudates,  Neck supple, trachea midline, no carotid bruits, no thyroidmegaly Lungs: Clear to auscultation, no rhonchi or wheezes, or rib retractions  Heart: Regular rate and rhythm, no murmurs or gallops Breast:Examined in sitting and supine position were symmetrical in appearance, no palpable masses or tenderness,  no skin retraction, no nipple inversion, no nipple discharge, no skin discoloration, no axillary or supraclavicular lymphadenopathy Abdomen: no palpable masses or tenderness, no rebound or guarding Extremities: no edema or skin discoloration or tenderness  Pelvic: Vulva: Normal             Vagina: No gross lesions or discharge  Cervix: No gross lesions or discharge.  Pap reflex done.  Uterus  AV, normal size, shape and consistency, non-tender and mobile  Adnexa  Without masses or tenderness  Anus: Normal   Assessment/Plan:  63 y.o. female for annual exam   1. Encounter for routine gynecological examination with Papanicolaou smear of cervix Normal gynecologic exam.  Pap reflex done.  Breast exam normal.  We will repeat a screening mammogram in February 2020.  Colonoscopy 2018.  Health labs with family physician.  2. Postmenopause Well on no hormone replacement therapy.  No postmenopausal bleeding.  Last bone density 4 years ago was normal, will repeat at 5 years.  3. Obesity (BMI 30.0-34.9) Recommend a lower calorie/carb diet such as [employer].  Will resume more physical activity after left hip replacement.  Genia Del MD, 10:51 AM 10/11/2019

## 2019-10-11 NOTE — Patient Instructions (Signed)
1. Encounter for routine gynecological examination with Papanicolaou smear of cervix Normal gynecologic exam.  Pap reflex done.  Breast exam normal.  We will repeat a screening mammogram in February 2020.  Colonoscopy 2018.  Health labs with family physician.  2. Postmenopause Well on no hormone replacement therapy.  No postmenopausal bleeding.  Last bone density 4 years ago was normal, will repeat at 5 years.  3. Obesity (BMI 30.0-34.9) Recommend a lower calorie/carb diet such as [employer].  Will resume more physical activity after left hip replacement.  Brooke Farrell, it was a pleasure seeing you today!  I will inform you of your results as soon as they are available.

## 2019-10-12 LAB — PAP IG W/ RFLX HPV ASCU

## 2019-10-13 ENCOUNTER — Ambulatory Visit (INDEPENDENT_AMBULATORY_CARE_PROVIDER_SITE_OTHER): Payer: BC Managed Care – PPO | Admitting: *Deleted

## 2019-10-13 DIAGNOSIS — J309 Allergic rhinitis, unspecified: Secondary | ICD-10-CM

## 2019-10-13 MED ORDER — EPINEPHRINE 0.3 MG/0.3ML IJ SOAJ: 0.3000 mg | Freq: Once | INTRAMUSCULAR | 1 refills | Status: AC

## 2019-10-22 ENCOUNTER — Ambulatory Visit (INDEPENDENT_AMBULATORY_CARE_PROVIDER_SITE_OTHER): Payer: BC Managed Care – PPO

## 2019-10-22 ENCOUNTER — Encounter: Payer: Self-pay | Admitting: Allergy & Immunology

## 2019-10-22 DIAGNOSIS — J309 Allergic rhinitis, unspecified: Secondary | ICD-10-CM | POA: Diagnosis not present

## 2019-10-27 ENCOUNTER — Ambulatory Visit (INDEPENDENT_AMBULATORY_CARE_PROVIDER_SITE_OTHER): Payer: BC Managed Care – PPO

## 2019-10-27 DIAGNOSIS — J309 Allergic rhinitis, unspecified: Secondary | ICD-10-CM

## 2019-11-03 ENCOUNTER — Ambulatory Visit (INDEPENDENT_AMBULATORY_CARE_PROVIDER_SITE_OTHER): Payer: BC Managed Care – PPO

## 2019-11-03 DIAGNOSIS — J309 Allergic rhinitis, unspecified: Secondary | ICD-10-CM | POA: Diagnosis not present

## 2019-11-10 ENCOUNTER — Ambulatory Visit (INDEPENDENT_AMBULATORY_CARE_PROVIDER_SITE_OTHER): Payer: BC Managed Care – PPO

## 2019-11-10 DIAGNOSIS — J309 Allergic rhinitis, unspecified: Secondary | ICD-10-CM

## 2019-11-17 ENCOUNTER — Ambulatory Visit (INDEPENDENT_AMBULATORY_CARE_PROVIDER_SITE_OTHER): Payer: BC Managed Care – PPO

## 2019-11-17 DIAGNOSIS — J309 Allergic rhinitis, unspecified: Secondary | ICD-10-CM

## 2019-11-24 ENCOUNTER — Ambulatory Visit (INDEPENDENT_AMBULATORY_CARE_PROVIDER_SITE_OTHER): Payer: BC Managed Care – PPO

## 2019-11-24 DIAGNOSIS — J309 Allergic rhinitis, unspecified: Secondary | ICD-10-CM | POA: Diagnosis not present

## 2019-12-01 ENCOUNTER — Ambulatory Visit (INDEPENDENT_AMBULATORY_CARE_PROVIDER_SITE_OTHER): Payer: BC Managed Care – PPO

## 2019-12-01 DIAGNOSIS — J309 Allergic rhinitis, unspecified: Secondary | ICD-10-CM | POA: Diagnosis not present

## 2019-12-08 ENCOUNTER — Ambulatory Visit (INDEPENDENT_AMBULATORY_CARE_PROVIDER_SITE_OTHER): Payer: BC Managed Care – PPO

## 2019-12-08 DIAGNOSIS — J309 Allergic rhinitis, unspecified: Secondary | ICD-10-CM

## 2019-12-17 ENCOUNTER — Ambulatory Visit (INDEPENDENT_AMBULATORY_CARE_PROVIDER_SITE_OTHER): Payer: BC Managed Care – PPO

## 2019-12-17 DIAGNOSIS — J309 Allergic rhinitis, unspecified: Secondary | ICD-10-CM | POA: Diagnosis not present

## 2019-12-22 ENCOUNTER — Ambulatory Visit (INDEPENDENT_AMBULATORY_CARE_PROVIDER_SITE_OTHER): Payer: BC Managed Care – PPO

## 2019-12-22 DIAGNOSIS — J309 Allergic rhinitis, unspecified: Secondary | ICD-10-CM

## 2019-12-27 DIAGNOSIS — J3089 Other allergic rhinitis: Secondary | ICD-10-CM

## 2019-12-27 NOTE — Progress Notes (Signed)
VIALS EXP 12-26-20 

## 2019-12-29 ENCOUNTER — Ambulatory Visit (INDEPENDENT_AMBULATORY_CARE_PROVIDER_SITE_OTHER): Payer: BC Managed Care – PPO

## 2019-12-29 DIAGNOSIS — J309 Allergic rhinitis, unspecified: Secondary | ICD-10-CM

## 2020-01-03 HISTORY — PX: TOTAL HIP ARTHROPLASTY: SHX124

## 2020-01-12 ENCOUNTER — Ambulatory Visit (INDEPENDENT_AMBULATORY_CARE_PROVIDER_SITE_OTHER): Payer: BC Managed Care – PPO

## 2020-01-12 DIAGNOSIS — J309 Allergic rhinitis, unspecified: Secondary | ICD-10-CM

## 2020-01-14 DIAGNOSIS — R7303 Prediabetes: Secondary | ICD-10-CM | POA: Insufficient documentation

## 2020-01-24 ENCOUNTER — Telehealth: Payer: Self-pay

## 2020-01-24 NOTE — Telephone Encounter (Signed)
I called patient back and confirmed Thursday appointment is cancelled and asked her if she wanted to reschedule. Patient states she would not like to reschedule at this time but would call back when she is able to.

## 2020-01-24 NOTE — Telephone Encounter (Signed)
Patient left a voicemail this morning at 8:49a asking [place] to cancel her Thursday appointment because she just had hip replacement surgery. I cancelled this appt.  Another voicemail left at 1:24p:  Patient is asking that we cancel her appointment for Thursday. She was frustrated that she had to leave another message.  I guess she would like a call back.

## 2020-01-27 ENCOUNTER — Ambulatory Visit: Payer: BLUE CROSS/BLUE SHIELD | Admitting: Neurology

## 2020-02-09 ENCOUNTER — Ambulatory Visit (INDEPENDENT_AMBULATORY_CARE_PROVIDER_SITE_OTHER): Payer: BC Managed Care – PPO

## 2020-02-09 DIAGNOSIS — J309 Allergic rhinitis, unspecified: Secondary | ICD-10-CM | POA: Diagnosis not present

## 2020-02-16 ENCOUNTER — Ambulatory Visit (INDEPENDENT_AMBULATORY_CARE_PROVIDER_SITE_OTHER): Payer: BC Managed Care – PPO

## 2020-02-16 DIAGNOSIS — J309 Allergic rhinitis, unspecified: Secondary | ICD-10-CM

## 2020-02-23 ENCOUNTER — Ambulatory Visit (INDEPENDENT_AMBULATORY_CARE_PROVIDER_SITE_OTHER): Payer: BC Managed Care – PPO

## 2020-02-23 DIAGNOSIS — J309 Allergic rhinitis, unspecified: Secondary | ICD-10-CM | POA: Diagnosis not present

## 2020-03-01 ENCOUNTER — Ambulatory Visit (INDEPENDENT_AMBULATORY_CARE_PROVIDER_SITE_OTHER): Payer: BC Managed Care – PPO

## 2020-03-01 DIAGNOSIS — J309 Allergic rhinitis, unspecified: Secondary | ICD-10-CM

## 2020-03-08 ENCOUNTER — Ambulatory Visit (INDEPENDENT_AMBULATORY_CARE_PROVIDER_SITE_OTHER): Payer: BC Managed Care – PPO

## 2020-03-08 DIAGNOSIS — J309 Allergic rhinitis, unspecified: Secondary | ICD-10-CM | POA: Diagnosis not present

## 2020-03-15 ENCOUNTER — Ambulatory Visit (INDEPENDENT_AMBULATORY_CARE_PROVIDER_SITE_OTHER): Payer: BC Managed Care – PPO

## 2020-03-15 DIAGNOSIS — J309 Allergic rhinitis, unspecified: Secondary | ICD-10-CM

## 2020-03-20 ENCOUNTER — Ambulatory Visit: Payer: BC Managed Care – PPO | Admitting: Podiatry

## 2020-03-22 ENCOUNTER — Ambulatory Visit (INDEPENDENT_AMBULATORY_CARE_PROVIDER_SITE_OTHER): Payer: BC Managed Care – PPO

## 2020-03-22 DIAGNOSIS — J309 Allergic rhinitis, unspecified: Secondary | ICD-10-CM

## 2020-03-29 ENCOUNTER — Ambulatory Visit (INDEPENDENT_AMBULATORY_CARE_PROVIDER_SITE_OTHER): Payer: BC Managed Care – PPO

## 2020-03-29 DIAGNOSIS — J309 Allergic rhinitis, unspecified: Secondary | ICD-10-CM

## 2020-04-05 ENCOUNTER — Ambulatory Visit (INDEPENDENT_AMBULATORY_CARE_PROVIDER_SITE_OTHER): Payer: BC Managed Care – PPO

## 2020-04-05 ENCOUNTER — Ambulatory Visit (INDEPENDENT_AMBULATORY_CARE_PROVIDER_SITE_OTHER): Payer: BC Managed Care – PPO | Admitting: Neurology

## 2020-04-05 ENCOUNTER — Encounter: Payer: Self-pay | Admitting: Neurology

## 2020-04-05 ENCOUNTER — Other Ambulatory Visit: Payer: Self-pay

## 2020-04-05 VITALS — BP 128/81 | HR 75 | Ht 68.0 in | Wt 207.0 lb

## 2020-04-05 DIAGNOSIS — Z9989 Dependence on other enabling machines and devices: Secondary | ICD-10-CM | POA: Diagnosis not present

## 2020-04-05 DIAGNOSIS — G4733 Obstructive sleep apnea (adult) (pediatric): Secondary | ICD-10-CM

## 2020-04-05 DIAGNOSIS — J309 Allergic rhinitis, unspecified: Secondary | ICD-10-CM

## 2020-04-05 NOTE — Patient Instructions (Signed)
You continue to be fully compliant with your CPAP, please keep up the good work.  We can see you in 1 year, you can see one of our nurse practitioners as you are stable.

## 2020-04-05 NOTE — Progress Notes (Signed)
Subjective:    Patient ID: Brooke Farrell is a 64 y.o. female.  HPI     Interim history:   Brooke Farrell is a 64 year old right-handed woman with an underlying medical history of hypertension, reflux disease, IBS with diarrhea, anxiety, depression, history of kidney stone, depression, allergic rhinitis, and obesity, who presents for follow-up consultation of her obstructive sleep apnea, after restarting CPAP therapy. The patient is unaccompanied today and presents for her yearly check up. I last saw her on 01/21/19, at which time She was compliant with her CPAP.  I suggested we increase her pressure to 11 cm, as her AHI was 9.7/h on average. She was advised to follow-up in 1 year.   Today, 04/05/2020: I reviewed her CPAP compliance data from 03/05/2020 through 04/03/2020, which is a total of 30 days, during which time she used her machine every night with percent use days greater than 4 hours at 97%, indicating excellent compliance with an average usage of 7 hours and 55 minutes, residual AHI at goal at 3.6/h, leak on the higher side with a 95th percentile at 19.5 L/min, pressure of 10 cm with EPR of 3.  She reports feeling stable and doing well with her CPAP, indicates full compliance and ongoing good results.  She is up-to-date with her supplies and uses nasal pillows.  Although the pressure is currently at 10 cm, it seems adequate at this time.   The patient's allergies, current medications, family history, past medical history, past social history, past surgical history and problem list were reviewed and updated as appropriate.    Previously:   I saw her on 08/05/2018, at which time she reported significant interim health issues including a diagnosis of hiatal hernia, she had severe nosebleeds. She had consulted with ENT, was diagnosed with deviated septum and was encouraged to lose weight for OSA treatment. She saw a dentist for consideration of a dental device but was not deemed a good candidate for  treatment. She had lost her mother. She desired to start CPAP again. She did have prior difficulty with that. We talked about her previous test results and I restarted CPAP therapy at a pressure of 10 cm.   I reviewed her CPAP compliance data from 12/22/2018 through 01/20/2019 which is a total of 30 days, during which time she used her machine 29 days with percent used days greater than 4 hours at 80%, indicating very good compliance with an average usage of 5 hours and 21 minutes, residual AHI suboptimal at 9.7 per hour, leak on the high side with the 95th percentile at 33 L/m on a pressure of 10 cm with EPR of 3. Most residual events appear to be obstructive brother than central in nature.     I first met her on 07/29/2017 at the request of her primary care physician, at which time she reported snoring and daytime somnolence as well as oxygen desaturations noted during her colonoscopy in August 2018. She also reported a family history of sleep apnea and witnessed apneas per husbands report. She was advised to proceed with a sleep study. She had a split-night sleep study on 08/29/2017 which showed a sleep efficiency of 82.8% at baseline, increased light stage sleep and decreased REM sleep, total AHI of 65.7 per hour. Supine AHI was 110.4 per hour. Average oxygen saturation was 92%, nadir was 75%. She had no significant PLMS. She was noted to have moderate snoring during the baseline portion of the study. She was titrated on CPAP  between 5 cm and 10 cm. She was briefly tried on BiPAP and BiPAP ST because she had central apneas. On the final CPAP pressure of 10 cm her AHI was 3.9 per hour, O2 nadir of 84%. I prescribed CPAP therapy for home use. Patient was unable to tolerate CPAP and returned the CPAP machine to her DME company.    07/29/2017: (She) reports snoring and daytime somnolence as well as witnessed oxygen desaturations during her colonoscopy recently on 06/26/2017. She has a sister with sleep apnea  and her husband has also noticed pauses in her breathing while asleep. I reviewed your office note from 02/10/2017, which you kindly included. Her Epworth sleepiness score is 11 out of 24 today, fatigue score is 49 out of 63. She is retired, worked for a [employer] for over 30 years, she lives with her husband. They have 1 son who lives across the street from them with his family. She is a nonsmoker and does not currently utilize alcohol and does not use illicit drugs, drinks caffeine in the form of coffee, 1-1/2 cups per day and cola once a day.  She has had non-restorative sleep for about 2 years, and has had difficulty with sleep onset and sleep maintentance for about 2-3 years, has been on Ambien for about this time. She has an elderly, 65 yo in a NH.  She has a sister and 2 brother, she has one son.  She has had some AM HAs, nocturia, about 1-2/night. She goes to bed around 9PM, and WT is around 5:15 AM and takes her GS to school. She denies telltale restless leg symptoms, sometimes her right hip bothers her.  Her Past Medical History Is Significant For: Past Medical History:  Diagnosis Date   Acid reflux    Anxiety    Asthma     Her Past Surgical History Is Significant For: Past Surgical History:  Procedure Laterality Date   AUGMENTATION MAMMAPLASTY  1985   NO PAST SURGERIES      Her Family History Is Significant For: Family History  Problem Relation Age of Onset   Breast cancer Mother    Cancer Father        bone   Diabetes Father    Hypertension Father    Heart attack Father    Allergic rhinitis Neg Hx    Angioedema Neg Hx    Asthma Neg Hx    Atopy Neg Hx    Eczema Neg Hx    Immunodeficiency Neg Hx    Urticaria Neg Hx     Her Social History Is Significant For: Social History   Socioeconomic History   Marital status: Married    Spouse name: Not on file   Number of children: Not on file   Years of education: Not on file   Highest education level:  Not on file  Occupational History   Not on file  Tobacco Use   Smoking status: Never Smoker   Smokeless tobacco: Never Used  Substance and Sexual Activity   Alcohol use: Yes    Alcohol/week: 0.0 standard drinks    Comment: RARE   Drug use: No   Sexual activity: Not Currently    Partners: Male    Comment: 1st intercourse- 45, partners- 67, married- 64 yrs  Other Topics Concern   Not on file  Social History Narrative   Not on file   Social Determinants of Health   Financial Resource Strain:    Difficulty of Paying Living Expenses:  Food Insecurity:    Worried About Charity fundraiser in the Last Year:    Arboriculturist in the Last Year:   Transportation Needs:    Film/video editor (Medical):    Lack of Transportation (Non-Medical):   Physical Activity:    Days of Exercise per Week:    Minutes of Exercise per Session:   Stress:    Feeling of Stress :   Social Connections:    Frequency of Communication with Friends and Family:    Frequency of Social Gatherings with Friends and Family:    Attends Religious Services:    Active Member of Clubs or Organizations:    Attends Music therapist:    Marital Status:     Her Allergies Are:  Allergies  Allergen Reactions   Prednisone Other (See Comments)    Patient reports having very bad mood swings and was very angry when on this drug   Robitussin (Alcohol Free)  [Guaifenesin] Swelling   Sulfa Antibiotics Swelling   Corticosteroids    Amoxicillin Diarrhea  :   Her Current Medications Are:  Outpatient Encounter Medications as of 04/05/2020  Medication Sig   albuterol (VENTOLIN HFA) 108 (90 Base) MCG/ACT inhaler Inhale 2 puffs into the lungs every 4 (four) hours as needed for wheezing or shortness of breath.   azelastine (ASTELIN) 0.1 % nasal spray Place 1 spray into both nostrils daily. Use in each nostril as directed   cetirizine (ZYRTEC) 10 MG tablet Take 10 mg by mouth  daily as needed.    Cetirizine HCl (ZERVIATE) 0.24 % SOLN Apply 1 drop to eye 2 (two) times daily as needed.   Dexlansoprazole 30 MG capsule Take 30 mg by mouth daily as needed.   Eluxadoline (VIBERZI) 75 MG TABS Take 75 mg by mouth daily.   FLUoxetine (PROZAC) 20 MG capsule Take 40 mg by mouth daily.   fluticasone (FLONASE) 50 MCG/ACT nasal spray Place 1 spray into both nostrils daily.   montelukast (SINGULAIR) 10 MG tablet Take 1 tablet (10 mg total) by mouth daily as needed.   pneumococcal 13-valent conjugate vaccine (PREVNAR 13) SUSP injection Prevnar 13 (PF) 0.5 mL intramuscular syringe   zolpidem (AMBIEN) 10 MG tablet Take 1 tablet by mouth as needed.   No facility-administered encounter medications on file as of 04/05/2020.  :  Review of Systems:  Out of a complete 14 point review of systems, all are reviewed and negative with the exception of these symptoms as listed below: Review of Systems  Neurological:       Here for yearly f/u- pt reports she has been doing well since her last visit. No issues/difficulties to report.     Objective:  Neurological Exam  Physical Exam Physical Examination:   Vitals:   04/05/20 1010  BP: 128/81  Pulse: 75    General Examination: The patient is a very pleasant 63 y.o. female in no acute distress. She appears well-developed and well-nourished and well groomed.   HEENT:Normocephalic, atraumatic, pupils are equal, round and reactive to light, EOMI. Hearing is grossly intact. Face is symmetric with normal facial animation. Speech is clear with no dysarthria noted. There is no hypophonia. There is no lip, neck/head, jaw or voice tremor. Oropharynx exam reveals: mildmouth dryness, adequatedental hygiene and moderateairway crowding. Tongue protrudes centrally and palate elevates symmetrically.   Chest:Clear to auscultation without wheezing, rhonchi or crackles noted.  Heart:S1+S2+0, regular and normal without murmurs, rubs or  gallops noted.  Abdomen:Soft, non-tender and non-distended.  Extremities:There isnopitting edema in the distal lower extremities bilaterally.  Skin: Warm and dry without trophic changes noted.   Musculoskeletal: exam reveals no obvious joint deformities, tenderness or joint swelling or erythema.   Neurologically: Mental status: The patient is awake, alert and oriented in all 4 spheres.Herimmediate and remote memory, attention, language skills and fund of knowledge are appropriate. There is no evidence of aphasia, agnosia, apraxia or anomia. Speech is clear with normal prosody and enunciation. Thought process is linear. Mood is normaland affect is normal.  Cranial nerves II - XII are as described above under HEENT exam.  Motor exam: Normal bulk, strength and tone is noted. Finemotor skills and coordination:grosslyintact.  Cerebellar testing: No dysmetria or intention tremor. There is no truncal or gait ataxia.  Sensory exam: intact to light touch in the upper and lower extremities.  Gait, station and balance:Shestands easily. No veering to one side is noted. No leaning to one side is noted. Posture is age-appropriate and stance is narrow based. Gait showsnormalstride length and normalpace. No problems turning are noted.   Assessmentand Plan:  In summary,Brooke H Tuttleis a very pleasant 37 year oldfemalewith an underlying medical history of hypertension, reflux disease, IBS with diarrhea, anxiety, depression, history of kidney stone, depression, allergic rhinitis, and obesity, whopresents for Follow-up consultation of her obstructive sleep apnea. She had a split-night sleep study in October 2018 which confirmed severe obstructive sleep apnea. She was originally started on CPAP therapy but could not tolerate it at the time. She sought consultation with dentistry and ENT. She then decided to restart CPAP therapy and has been successful in establishing treatment with CPAP.   She is fully compliant with treatment and is commended for it.  She tolerates the nasal pillows and the pressure at 10 cm and apnea scores are adequate at this time.  She is advised to follow-up routinely in 1 year to see one of our nurse practitioners.  She is typically up-to-date with her supplies.  I answered all her questions today and she was in agreement with the plan.   I spent 20 minutes in total face-to-face time and in reviewing records during pre-charting, more than 50% of which was spent in counseling and coordination of care, reviewing test results, reviewing medications and treatment regimen and/or in discussing or reviewing the diagnosis of OSA, the prognosis and treatment options. Pertinent laboratory and imaging test results that were available during this visit with the patient were reviewed by me and considered in my medical decision making (see chart for details).

## 2020-04-10 ENCOUNTER — Other Ambulatory Visit: Payer: Self-pay

## 2020-04-10 ENCOUNTER — Ambulatory Visit (INDEPENDENT_AMBULATORY_CARE_PROVIDER_SITE_OTHER): Payer: BC Managed Care – PPO | Admitting: Podiatry

## 2020-04-10 ENCOUNTER — Ambulatory Visit (INDEPENDENT_AMBULATORY_CARE_PROVIDER_SITE_OTHER): Payer: BC Managed Care – PPO

## 2020-04-10 ENCOUNTER — Encounter: Payer: Self-pay | Admitting: Podiatry

## 2020-04-10 DIAGNOSIS — M722 Plantar fascial fibromatosis: Secondary | ICD-10-CM

## 2020-04-10 MED ORDER — MELOXICAM 15 MG PO TABS: 15.0000 mg | ORAL_TABLET | Freq: Every day | ORAL | 0 refills | Status: DC

## 2020-04-10 NOTE — Patient Instructions (Addendum)
If was nice to meet you today. If you have any questions or any further concerns, please feel fee to give me a call. You can call our office at [phone] or please feel fee to send me a message through MyChart.   Plantar Fasciitis (Heel Spur Syndrome) with Rehab The plantar fascia is a fibrous, ligament-like, soft-tissue structure that spans the bottom of the foot. Plantar fasciitis is a condition that causes pain in the foot due to inflammation of the tissue. SYMPTOMS  Pain and tenderness on the underneath side of the foot. Pain that worsens with standing or walking. CAUSES  Plantar fasciitis is caused by irritation and injury to the plantar fascia on the underneath side of the foot. Common mechanisms of injury include: Direct trauma to bottom of the foot. Damage to a small nerve that runs under the foot where the main fascia attaches to the heel bone. Stress placed on the plantar fascia due to bone spurs. RISK INCREASES WITH:  Activities that place stress on the plantar fascia (running, jumping, pivoting, or cutting). Poor strength and flexibility. Improperly fitted shoes. Tight calf muscles. Flat feet. Failure to warm-up properly before activity. Obesity. PREVENTION Warm up and stretch properly before activity. Allow for adequate recovery between workouts. Maintain physical fitness: Strength, flexibility, and endurance. Cardiovascular fitness. Maintain a health body weight. Avoid stress on the plantar fascia. Wear properly fitted shoes, including arch supports for individuals who have flat feet.  PROGNOSIS  If treated properly, then the symptoms of plantar fasciitis usually resolve without surgery. However, occasionally surgery is necessary.  RELATED COMPLICATIONS  Recurrent symptoms that may result in a chronic condition. Problems of the lower back that are caused by compensating for the injury, such as limping. Pain or weakness of the foot during push-off following  surgery. Chronic inflammation, scarring, and partial or complete fascia tear, occurring more often from repeated injections.  TREATMENT  Treatment initially involves the use of ice and medication to help reduce pain and inflammation. The use of strengthening and stretching exercises may help reduce pain with activity, especially stretches of the Achilles tendon. These exercises may be performed at home or with a therapist. Your caregiver may recommend that you use heel cups of arch supports to help reduce stress on the plantar fascia. Occasionally, corticosteroid injections are given to reduce inflammation. If symptoms persist for greater than 6 months despite non-surgical (conservative), then surgery may be recommended.   MEDICATION  If pain medication is necessary, then nonsteroidal anti-inflammatory medications, such as aspirin and ibuprofen, or other minor pain relievers, such as acetaminophen, are often recommended. Do not take pain medication within 7 days before surgery. Prescription pain relievers may be given if deemed necessary by your caregiver. Use only as directed and only as much as you need. Corticosteroid injections may be given by your caregiver. These injections should be reserved for the most serious cases, because they may only be given a certain number of times.  HEAT AND COLD Cold treatment (icing) relieves pain and reduces inflammation. Cold treatment should be applied for 10 to 15 minutes every 2 to 3 hours for inflammation and pain and immediately after any activity that aggravates your symptoms. Use ice packs or massage the area with a piece of ice (ice massage). Heat treatment may be used prior to performing the stretching and strengthening activities prescribed by your caregiver, physical therapist, or athletic trainer. Use a heat pack or soak the injury in warm water.  SEEK IMMEDIATE MEDICAL CARE IF:   Treatment seems to offer no benefit, or the condition worsens. Any  medications produce adverse side effects.  EXERCISES- RANGE OF MOTION (ROM) AND STRETCHING EXERCISES - Plantar Fasciitis (Heel Spur Syndrome) These exercises may help you when beginning to rehabilitate your injury. Your symptoms may resolve with or without further involvement from your physician, physical therapist or athletic trainer. While completing these exercises, remember:  Restoring tissue flexibility helps normal motion to return to the joints. This allows healthier, less painful movement and activity. An effective stretch should be held for at least 30 seconds. A stretch should never be painful. You should only feel a gentle lengthening or release in the stretched tissue.  RANGE OF MOTION - Toe Extension, Flexion Sit with your right / left leg crossed over your opposite knee. Grasp your toes and gently pull them back toward the top of your foot. You should feel a stretch on the bottom of your toes and/or foot. Hold this stretch for 10 seconds. Now, gently pull your toes toward the bottom of your foot. You should feel a stretch on the top of your toes and or foot. Hold this stretch for 10 seconds. Repeat  times. Complete this stretch 3 times per day.   RANGE OF MOTION - Ankle Dorsiflexion, Active Assisted Remove shoes and sit on a chair that is preferably not on a carpeted surface. Place right / left foot under knee. Extend your opposite leg for support. Keeping your heel down, slide your right / left foot back toward the chair until you feel a stretch at your ankle or calf. If you do not feel a stretch, slide your bottom forward to the edge of the chair, while still keeping your heel down. Hold this stretch for 10 seconds. Repeat 3 times. Complete this stretch 2 times per day.   STRETCH  Gastroc, Standing Place hands on wall. Extend right / left leg, keeping the front knee somewhat bent. Slightly point your toes inward on your back foot. Keeping your right / left heel on the  floor and your knee straight, shift your weight toward the wall, not allowing your back to arch. You should feel a gentle stretch in the right / left calf. Hold this position for 10 seconds. Repeat 3 times. Complete this stretch 2 times per day.  STRETCH  Soleus, Standing Place hands on wall. Extend right / left leg, keeping the other knee somewhat bent. Slightly point your toes inward on your back foot. Keep your right / left heel on the floor, bend your back knee, and slightly shift your weight over the back leg so that you feel a gentle stretch deep in your back calf. Hold this position for 10 seconds. Repeat 3 times. Complete this stretch 2 times per day.  STRETCH  Gastrocsoleus, Standing  Note: This exercise can place a lot of stress on your foot and ankle. Please complete this exercise only if specifically instructed by your caregiver.  Place the ball of your right / left foot on a step, keeping your other foot firmly on the same step. Hold on to the wall or a rail for balance. Slowly lift your other foot, allowing your body weight to press your heel down over the edge of the step. You should feel a stretch in your right / left calf. Hold this position for 10 seconds. Repeat this exercise with a slight bend in your right / left knee. Repeat 3 times. Complete this stretch 2 times per day.   STRENGTHENING EXERCISES -   Plantar Fasciitis (Heel Spur Syndrome)  These exercises may help you when beginning to rehabilitate your injury. They may resolve your symptoms with or without further involvement from your physician, physical therapist or athletic trainer. While completing these exercises, remember:  Muscles can gain both the endurance and the strength needed for everyday activities through controlled exercises. Complete these exercises as instructed by your physician, physical therapist or athletic trainer. Progress the resistance and repetitions only as guided.  STRENGTH - Towel  Curls Sit in a chair positioned on a non-carpeted surface. Place your foot on a towel, keeping your heel on the floor. Pull the towel toward your heel by only curling your toes. Keep your heel on the floor. Repeat 3 times. Complete this exercise 2 times per day.  STRENGTH - Ankle Inversion Secure one end of a rubber exercise band/tubing to a fixed object (table, pole). Loop the other end around your foot just before your toes. Place your fists between your knees. This will focus your strengthening at your ankle. Slowly, pull your big toe up and in, making sure the band/tubing is positioned to resist the entire motion. Hold this position for 10 seconds. Have your muscles resist the band/tubing as it slowly pulls your foot back to the starting position. Repeat 3 times. Complete this exercises 2 times per day.  Document Released: 10/21/2005 Document Revised: 01/13/2012 Document Reviewed: 02/02/2009 [employer] Patient Information 2014 [employer].  

## 2020-04-10 NOTE — Progress Notes (Signed)
Subjective:   Patient ID: Brooke Farrell, female   DOB: 64 y.o.   MRN: [id number]   HPI 64 year old female presents the office today for concerns of right heel pain which is been on the past 5 to 6 weeks.  She still hurts at the great toes on and she has tried as well as icing helping significantly.  No recent injury and she denies any swelling or redness.  She does report her hip replaced on the right side in March and her pain the foot started after that.  No other concerns today   Review of Systems  All other systems reviewed and are negative.  Past Medical History:  Diagnosis Date  . Acid reflux   . Anxiety   . Asthma     Past Surgical History:  Procedure Laterality Date  . AUGMENTATION MAMMAPLASTY  1985  . NO PAST SURGERIES       Current Outpatient Medications:  .  albuterol (VENTOLIN HFA) 108 (90 Base) MCG/ACT inhaler, Inhale 2 puffs into the lungs every 4 (four) hours as needed for wheezing or shortness of breath., Disp: 6.7 Inhaler, Rfl: 1 .  azelastine (ASTELIN) 0.1 % nasal spray, Place 1 spray into both nostrils daily. Use in each nostril as directed, Disp: 90 mL, Rfl: 1 .  cetirizine (ZYRTEC) 10 MG tablet, Take 10 mg by mouth daily as needed. , Disp: , Rfl:  .  Cetirizine HCl (ZERVIATE) 0.24 % SOLN, Apply 1 drop to eye 2 (two) times daily as needed., Disp: 30 each, Rfl: 4 .  Dexlansoprazole 30 MG capsule, Take 30 mg by mouth daily as needed., Disp: , Rfl:  .  Eluxadoline (VIBERZI) 75 MG TABS, Take 75 mg by mouth daily., Disp: , Rfl:  .  FLUoxetine (PROZAC) 20 MG capsule, Take 40 mg by mouth daily., Disp: , Rfl:  .  FLUoxetine (PROZAC) 40 MG capsule, Take by mouth daily., Disp: , Rfl:  .  fluticasone (FLONASE) 50 MCG/ACT nasal spray, Place 1 spray into both nostrils daily., Disp: , Rfl:  .  meloxicam (MOBIC) 15 MG tablet, Take 1 tablet (15 mg total) by mouth daily., Disp: 14 tablet, Rfl: 0 .  montelukast (SINGULAIR) 10 MG tablet, Take 1 tablet (10 mg total) by mouth  daily as needed., Disp: 90 tablet, Rfl: 1 .  pneumococcal 13-valent conjugate vaccine (PREVNAR 13) SUSP injection, Prevnar 13 (PF) 0.5 mL intramuscular syringe, Disp: , Rfl:  .  zolpidem (AMBIEN) 10 MG tablet, Take 1 tablet by mouth as needed., Disp: , Rfl:   Allergies  Allergen Reactions  . Prednisone Other (See Comments)    Patient reports having very bad mood swings and was very angry when on this drug  . Robitussin (Alcohol Free)  [Guaifenesin] Swelling  . Sulfa Antibiotics Swelling  . Corticosteroids   . Amoxicillin Diarrhea          Objective:  Physical Exam  General: AAO x3, NAD  Dermatological: Skin is warm, dry and supple bilateral. Nails x 10 are well manicured; remaining integument appears unremarkable at this time. There are no open sores, no preulcerative lesions, no rash or signs of infection present.  Vascular: Dorsalis Pedis artery and Posterior Tibial artery pedal pulses are 2/4 bilateral with immedate capillary fill time. There is no pain with calf compression, swelling, warmth, erythema.   Neruologic: Grossly intact via light touch bilateral. Negative tinel sign   Musculoskeletal: Tenderness to palpation along the plantar medial tubercle of the calcaneus at the insertion of  plantar fascia on the right foot. There is no pain along the course of the plantar fascia within the arch of the foot. Plantar fascia appears to be intact. There is no pain with lateral compression of the calcaneus or pain with vibratory sensation. There is no pain along the course or insertion of the achilles tendon. No other areas of tenderness to bilateral lower extremities. Muscular strength 5/5 in all groups tested bilateral.  Gait: Unassisted, Nonantalgic.       Assessment:   Right heel pain, plantar fasciitis     Plan:  -Treatment options discussed including all alternatives, risks, and complications -Etiology of symptoms were discussed -X-rays were obtained and reviewed with the  patient.  No evidence of acute fracture identified today. -Steroid injection performed.  See procedure note below. -Plantar fascial brace -Discussed traction, icing daily as well as shoe modifications and orthotics  Procedure: Injection Tendon/Ligament Discussed alternatives, risks, complications and verbal consent was obtained.  Location: RIGHT plantar fascia at the glabrous junction; medial approach. Skin Prep: Alcohol  Injectate: 0.5cc 0.5% marcaine plain, 0.5 cc 2% lidocaine plain and, 1 cc kenalog 10. Disposition: Patient tolerated procedure well. Injection site dressed with a band-aid.  Post-injection care was discussed and return precautions discussed.   Return in about 4 weeks (around 05/08/2020).  Vivi Barrack DPM

## 2020-04-12 ENCOUNTER — Ambulatory Visit (INDEPENDENT_AMBULATORY_CARE_PROVIDER_SITE_OTHER): Payer: BC Managed Care – PPO

## 2020-04-12 DIAGNOSIS — J309 Allergic rhinitis, unspecified: Secondary | ICD-10-CM

## 2020-04-19 ENCOUNTER — Ambulatory Visit (INDEPENDENT_AMBULATORY_CARE_PROVIDER_SITE_OTHER): Payer: BC Managed Care – PPO

## 2020-04-19 DIAGNOSIS — J309 Allergic rhinitis, unspecified: Secondary | ICD-10-CM | POA: Diagnosis not present

## 2020-04-26 ENCOUNTER — Ambulatory Visit (INDEPENDENT_AMBULATORY_CARE_PROVIDER_SITE_OTHER): Payer: BC Managed Care – PPO

## 2020-04-26 DIAGNOSIS — J309 Allergic rhinitis, unspecified: Secondary | ICD-10-CM | POA: Diagnosis not present

## 2020-05-03 ENCOUNTER — Ambulatory Visit (INDEPENDENT_AMBULATORY_CARE_PROVIDER_SITE_OTHER): Payer: BC Managed Care – PPO

## 2020-05-03 DIAGNOSIS — J309 Allergic rhinitis, unspecified: Secondary | ICD-10-CM

## 2020-05-05 NOTE — Progress Notes (Signed)
Vials EXP 05-05-2021 

## 2020-05-09 DIAGNOSIS — J3089 Other allergic rhinitis: Secondary | ICD-10-CM | POA: Diagnosis not present

## 2020-05-10 ENCOUNTER — Ambulatory Visit (INDEPENDENT_AMBULATORY_CARE_PROVIDER_SITE_OTHER): Payer: BC Managed Care – PPO

## 2020-05-10 DIAGNOSIS — J309 Allergic rhinitis, unspecified: Secondary | ICD-10-CM

## 2020-05-17 ENCOUNTER — Ambulatory Visit (INDEPENDENT_AMBULATORY_CARE_PROVIDER_SITE_OTHER): Payer: BC Managed Care – PPO

## 2020-05-17 DIAGNOSIS — J309 Allergic rhinitis, unspecified: Secondary | ICD-10-CM

## 2020-05-18 ENCOUNTER — Ambulatory Visit: Payer: BC Managed Care – PPO | Admitting: Podiatry

## 2020-06-07 ENCOUNTER — Ambulatory Visit (INDEPENDENT_AMBULATORY_CARE_PROVIDER_SITE_OTHER): Payer: BC Managed Care – PPO

## 2020-06-07 DIAGNOSIS — J309 Allergic rhinitis, unspecified: Secondary | ICD-10-CM

## 2020-06-13 ENCOUNTER — Ambulatory Visit (INDEPENDENT_AMBULATORY_CARE_PROVIDER_SITE_OTHER): Payer: BC Managed Care – PPO | Admitting: Podiatry

## 2020-06-13 ENCOUNTER — Other Ambulatory Visit: Payer: Self-pay

## 2020-06-13 DIAGNOSIS — M722 Plantar fascial fibromatosis: Secondary | ICD-10-CM

## 2020-06-13 NOTE — Patient Instructions (Signed)

## 2020-06-14 ENCOUNTER — Ambulatory Visit (INDEPENDENT_AMBULATORY_CARE_PROVIDER_SITE_OTHER): Payer: BC Managed Care – PPO

## 2020-06-14 DIAGNOSIS — J309 Allergic rhinitis, unspecified: Secondary | ICD-10-CM | POA: Diagnosis not present

## 2020-06-21 ENCOUNTER — Ambulatory Visit (INDEPENDENT_AMBULATORY_CARE_PROVIDER_SITE_OTHER): Payer: BC Managed Care – PPO

## 2020-06-21 DIAGNOSIS — J309 Allergic rhinitis, unspecified: Secondary | ICD-10-CM | POA: Diagnosis not present

## 2020-06-21 DIAGNOSIS — M722 Plantar fascial fibromatosis: Secondary | ICD-10-CM | POA: Insufficient documentation

## 2020-06-21 NOTE — Progress Notes (Signed)
Subjective: Brooke Farrell presents to the office today for follow-up of right heel pain. She sates that the last injection was helpful. The pain has come back about 2 weeks ago. She has had a hard time with the plantar fascial brace.  Otherwise doing rather insignificant changes.  Denies any systemic complaints such as fevers, chills, nausea, vomiting. No acute changes since last appointment, and no other complaints at this time.   Objective: AAO x3, NAD DP/PT pulses palpable bilaterally, CRT less than 3 seconds Tenderness on plantar medial tubercle of the calcaneus at the insertion of the fascia on the right foot.  No pain with lateral compression of calcaneus.  No pain with Achilles tendon.  Flexor, extensor tendons appear to be intact.  MMT 5/5.  No other areas of tenderness. No pain with calf compression, swelling, warmth, erythema  Assessment: Right heel pain, plantar fasciitis   Plan: -All treatment options discussed with the patient including all alternatives, risks, complications.  -Steroid injection performed.  See procedure note below. -Dispensed night splint -Continue plantar fascial brace we showed her how to wear this today. -Stretching, icing daily. -Discussed supportive shoes and orthotics -Patient encouraged to call the office with any questions, concerns, change in symptoms.   Procedure: Injection Tendon/Ligament Discussed alternatives, risks, complications and verbal consent was obtained.  Location: Right plantar fascia at the glabrous junction; medial approach. Skin Prep: Alcohol  Injectate: 0.5cc 0.5% marcaine plain, 0.5 cc 2% lidocaine plain and, 1 cc kenalog 10. Disposition: Patient tolerated procedure well. Injection site dressed with a band-aid.  Post-injection care was discussed and return precautions discussed.   Return in about 4 weeks (around 07/11/2020).  Vivi Barrack DPM

## 2020-06-28 ENCOUNTER — Ambulatory Visit (INDEPENDENT_AMBULATORY_CARE_PROVIDER_SITE_OTHER): Payer: BC Managed Care – PPO

## 2020-06-28 DIAGNOSIS — J309 Allergic rhinitis, unspecified: Secondary | ICD-10-CM

## 2020-07-05 ENCOUNTER — Ambulatory Visit (INDEPENDENT_AMBULATORY_CARE_PROVIDER_SITE_OTHER): Payer: BC Managed Care – PPO

## 2020-07-05 DIAGNOSIS — J309 Allergic rhinitis, unspecified: Secondary | ICD-10-CM | POA: Diagnosis not present

## 2020-07-13 ENCOUNTER — Other Ambulatory Visit: Payer: BLUE CROSS/BLUE SHIELD

## 2020-07-13 ENCOUNTER — Other Ambulatory Visit: Payer: Self-pay

## 2020-07-13 ENCOUNTER — Ambulatory Visit: Payer: BC Managed Care – PPO | Admitting: Podiatry

## 2020-07-13 ENCOUNTER — Other Ambulatory Visit: Payer: BC Managed Care – PPO | Admitting: Orthotics

## 2020-07-13 DIAGNOSIS — Z20822 Contact with and (suspected) exposure to covid-19: Secondary | ICD-10-CM

## 2020-07-14 ENCOUNTER — Ambulatory Visit (INDEPENDENT_AMBULATORY_CARE_PROVIDER_SITE_OTHER): Payer: BC Managed Care – PPO | Admitting: Allergy & Immunology

## 2020-07-14 ENCOUNTER — Encounter: Payer: Self-pay | Admitting: Allergy & Immunology

## 2020-07-14 ENCOUNTER — Other Ambulatory Visit: Payer: Self-pay

## 2020-07-14 DIAGNOSIS — J0100 Acute maxillary sinusitis, unspecified: Secondary | ICD-10-CM

## 2020-07-14 DIAGNOSIS — J302 Other seasonal allergic rhinitis: Secondary | ICD-10-CM | POA: Diagnosis not present

## 2020-07-14 DIAGNOSIS — J3089 Other allergic rhinitis: Secondary | ICD-10-CM

## 2020-07-14 DIAGNOSIS — J4520 Mild intermittent asthma, uncomplicated: Secondary | ICD-10-CM | POA: Diagnosis not present

## 2020-07-14 NOTE — Progress Notes (Signed)
RE: Brooke Farrell MRN: [id number] DOB: 1956-04-03 Date of Telemedicine Visit: 07/14/2020  Referring provider: Eartha Inch, MD Primary care provider: Eartha Inch, MD  Chief Complaint: No chief complaint on file.   Telemedicine Follow Up Visit via Telephone: I connected with [name] for a follow up on 07/14/20 by telephone and verified that I am speaking with the correct person using two identifiers.   I discussed the limitations, risks, security and privacy concerns of performing an evaluation and management service by telephone and the availability of in person appointments. I also discussed with the patient that there may be a patient responsible charge related to this service. The patient expressed understanding and agreed to proceed.  Patient is in her car.  Provider is at the office.  Visit start time: 10:18 AM Visit end time: 10:34 AM Insurance consent/check in by: [facility] consent and medical assistant/nurse: Da'Shaunia  History of Present Illness:  She is a 64 y.o. female, who is being followed for intermittent asthma as well as seasonal perennial allergic rhinitis. Her previous allergy office visit was in October 2020 with myself.  At that visit, she was doing very well with her shots, which we had recently remixed. We continued her on Xyzal 5 mg daily as well as her eyedrop. Her lung testing looked good.  We will continue with albuterol as needed.  Since last visit, she has mostly done well.  However, she reports that she has had 1 week of cough, congestion, and sinus pain.  She also endorses body aches and some diarrhea.  She had a COVID-19 swab sent yesterday which is still pending at the time of this writing.  Last week, she kept her granddaughter, who is sick.  She thinks that she got ill from her granddaughter.  Kelsha has since spread to other members of her own family.  She has not been febrile.  She has maintained good p.o. intake.  Otherwise,  there have been no changes to her past medical history, surgical history, family history, or social history.  Assessment and Plan:  Brooke Farrell is a 64 y.o. female with:  Seasonal and perennial allergic rhinitis(grasses, weeds, ragweed, trees, indoor mold, dust mites, cats, dogs, roach) - doing well on the re-mixed allergy vials  Mild intermittent asthma, uncomplicated  Viral URI - COVID testing pending  Fully vaccinated to COVID-19   Brooke Farrell presents for a sick visit.  She has had 1 week of cough, postnasal drip, congestion, and sinus pressure.  She has been afebrile this whole time.  She does have known sick contact, her granddaughter, and this illness seems to have made its way around her entire family.  Thankfully, she is completely vaccinated.  She has been using Mucinex and other over-the-counter medications with minimal relief, although even she admits that today is the best day she has had in around a week.  We are going to start her on a prednisone burst and follow-up on her COVID test.  Diagnostics: None.  Medication List:  Current Outpatient Medications  Medication Sig Dispense Refill  . albuterol (VENTOLIN HFA) 108 (90 Base) MCG/ACT inhaler Inhale 2 puffs into the lungs every 4 (four) hours as needed for wheezing or shortness of breath. 6.7 Inhaler 1  . azelastine (ASTELIN) 0.1 % nasal spray Place 1 spray into both nostrils daily. Use in each nostril as directed 90 mL 1  . cetirizine (ZYRTEC) 10 MG tablet Take 10 mg by mouth daily as needed.     Marland Kitchen  Dexlansoprazole 30 MG capsule Take 30 mg by mouth daily as needed.    . Eluxadoline (VIBERZI) 75 MG TABS Take 75 mg by mouth daily.    Marland Kitchen FLUoxetine (PROZAC) 20 MG capsule Take 40 mg by mouth daily.    . montelukast (SINGULAIR) 10 MG tablet Take 1 tablet (10 mg total) by mouth daily as needed. 90 tablet 1  . cefdinir (OMNICEF) 300 MG capsule Take 300 mg by mouth 2 (two) times daily. (Patient not taking: Reported on 07/14/2020)    .  Cetirizine HCl (ZERVIATE) 0.24 % SOLN Apply 1 drop to eye 2 (two) times daily as needed. (Patient not taking: Reported on 07/14/2020) 30 each 4  . FLUoxetine (PROZAC) 40 MG capsule Take by mouth daily. (Patient not taking: Reported on 07/14/2020)    . fluticasone (FLONASE) 50 MCG/ACT nasal spray Place 1 spray into both nostrils daily. (Patient not taking: Reported on 07/14/2020)    . meloxicam (MOBIC) 15 MG tablet Take 1 tablet (15 mg total) by mouth daily. (Patient not taking: Reported on 07/14/2020) 14 tablet 0  . pneumococcal 13-valent conjugate vaccine (PREVNAR 13) SUSP injection Prevnar 13 (PF) 0.5 mL intramuscular syringe    . zolpidem (AMBIEN) 10 MG tablet Take 1 tablet by mouth as needed. (Patient not taking: Reported on 07/14/2020)     No current facility-administered medications for this visit.   Allergies: Allergies  Allergen Reactions  . Prednisone Other (See Comments)    Patient reports having very bad mood swings and was very angry when on this drug  . Robitussin (Alcohol Free)  [Guaifenesin] Swelling  . Sulfa Antibiotics Swelling  . Corticosteroids   . Amoxicillin Diarrhea   I reviewed her past medical history, social history, family history, and environmental history and no significant changes have been reported from previous visits.  Review of Systems  Constitutional: Positive for chills and malaise/fatigue. Negative for fever and weight loss.  HENT: Positive for congestion and sinus pain. Negative for ear discharge and ear pain.   Eyes: Negative for pain, discharge and redness.  Respiratory: Negative for cough, sputum production, shortness of breath and wheezing.   Cardiovascular: Negative.  Negative for chest pain and palpitations.  Gastrointestinal: Negative for abdominal pain, constipation, diarrhea, heartburn, nausea and vomiting.  Skin: Negative.  Negative for itching and rash.  Neurological: Negative for dizziness and headaches.  Endo/Heme/Allergies: Negative for  environmental allergies. Does not bruise/bleed easily.     Objective:  Physical exam not obtained as encounter was done via telephone.   Previous notes and tests were reviewed.  I discussed the assessment and treatment plan with the patient. The patient was provided an opportunity to ask questions and all were answered. The patient agreed with the plan and demonstrated an understanding of the instructions.   The patient was advised to call back or seek an in-person evaluation if the symptoms worsen or if the condition fails to improve as anticipated.  I provided 16 minutes of non-face-to-face time during this encounter.  It was my pleasure to participate in La Villita Avalos's care today. Please feel free to contact me with any questions or concerns.   Sincerely,  Alfonse Spruce, MD

## 2020-07-15 ENCOUNTER — Encounter: Payer: Self-pay | Admitting: Allergy & Immunology

## 2020-07-15 LAB — SARS-COV-2, NAA 2 DAY TAT

## 2020-07-15 LAB — NOVEL CORONAVIRUS, NAA: SARS-CoV-2, NAA: DETECTED — AB

## 2020-07-16 ENCOUNTER — Other Ambulatory Visit: Payer: Self-pay | Admitting: Nurse Practitioner

## 2020-07-17 ENCOUNTER — Encounter: Payer: Self-pay | Admitting: Nurse Practitioner

## 2020-07-17 ENCOUNTER — Other Ambulatory Visit: Payer: Self-pay | Admitting: Nurse Practitioner

## 2020-07-17 DIAGNOSIS — J4520 Mild intermittent asthma, uncomplicated: Secondary | ICD-10-CM

## 2020-07-17 DIAGNOSIS — I10 Essential (primary) hypertension: Secondary | ICD-10-CM

## 2020-07-17 DIAGNOSIS — U071 COVID-19: Secondary | ICD-10-CM

## 2020-07-17 NOTE — Progress Notes (Signed)
I connected by phone with Brooke Farrell on 07/17/2020 at 12:31 PM to discuss the potential use of a new treatment for mild to moderate COVID-19 viral infection in non-hospitalized patients.  This patient is a 64 y.o. female that meets the FDA criteria for Emergency Use Authorization of COVID monoclonal antibody casirivimab/imdevimab.  Has a (+) direct SARS-CoV-2 viral test result  Has mild or moderate COVID-19   Is NOT hospitalized due to COVID-19  Is within 10 days of symptom onset  Has at least one of the high risk factor(s) for progression to severe COVID-19 and/or hospitalization as defined in EUA.  Specific high risk criteria : BMI > 25, Chronic Kidney Disease (CKD) and Chronic Lung Disease   I have spoken and communicated the following to the patient or parent/caregiver regarding COVID monoclonal antibody treatment:  1. FDA has authorized the emergency use for the treatment of mild to moderate COVID-19 in adults and pediatric patients with positive results of direct SARS-CoV-2 viral testing who are 32 years of age and older weighing at least 40 kg, and who are at high risk for progressing to severe COVID-19 and/or hospitalization.  2. The significant known and potential risks and benefits of COVID monoclonal antibody, and the extent to which such potential risks and benefits are unknown.  3. Information on available alternative treatments and the risks and benefits of those alternatives, including clinical trials.  4. Patients treated with COVID monoclonal antibody should continue to self-isolate and use infection control measures (e.g., wear mask, isolate, social distance, avoid sharing personal items, clean and disinfect "high touch" surfaces, and frequent handwashing) according to CDC guidelines.   5. The patient or parent/caregiver has the option to accept or refuse COVID monoclonal antibody treatment.  After reviewing this information with the patient, The patient agreed to  proceed with receiving casirivimab\imdevimab infusion and will be provided a copy of the Fact sheet prior to receiving the infusion. Tollie Eth 07/17/2020 12:31 PM

## 2020-07-18 ENCOUNTER — Ambulatory Visit (HOSPITAL_COMMUNITY)
Admission: RE | Admit: 2020-07-18 | Discharge: 2020-07-18 | Disposition: A | Discharge status: Home / Self Care | Payer: BC Managed Care – PPO | Source: Ambulatory Visit | Attending: Pulmonary Disease | Admitting: Pulmonary Disease

## 2020-07-18 DIAGNOSIS — I10 Essential (primary) hypertension: Secondary | ICD-10-CM | POA: Diagnosis not present

## 2020-07-18 DIAGNOSIS — U071 COVID-19: Secondary | ICD-10-CM | POA: Insufficient documentation

## 2020-07-18 DIAGNOSIS — J4520 Mild intermittent asthma, uncomplicated: Secondary | ICD-10-CM | POA: Insufficient documentation

## 2020-07-18 MED ORDER — SODIUM CHLORIDE 0.9 % IV SOLN
1200.0000 mg | Freq: Once | INTRAVENOUS | Status: AC
  Administered 2020-07-18: 1200 mg via INTRAVENOUS
  Filled 2020-07-18: qty 10

## 2020-07-18 MED ORDER — ALBUTEROL SULFATE HFA 108 (90 BASE) MCG/ACT IN AERS: 2.0000 | INHALATION_SPRAY | Freq: Once | RESPIRATORY_TRACT | Status: DC | PRN

## 2020-07-18 MED ORDER — FAMOTIDINE IN NACL 20-0.9 MG/50ML-% IV SOLN: 20.0000 mg | Freq: Once | INTRAVENOUS | Status: DC | PRN

## 2020-07-18 MED ORDER — DIPHENHYDRAMINE HCL 50 MG/ML IJ SOLN: 50.0000 mg | Freq: Once | INTRAMUSCULAR | Status: DC | PRN

## 2020-07-18 MED ORDER — SODIUM CHLORIDE 0.9 % IV SOLN: INTRAVENOUS | Status: DC | PRN

## 2020-07-18 MED ORDER — METHYLPREDNISOLONE SODIUM SUCC 125 MG IJ SOLR: 125.0000 mg | Freq: Once | INTRAMUSCULAR | Status: DC | PRN

## 2020-07-18 MED ORDER — EPINEPHRINE 0.3 MG/0.3ML IJ SOAJ: 0.3000 mg | Freq: Once | INTRAMUSCULAR | Status: DC | PRN

## 2020-07-18 NOTE — Progress Notes (Signed)
  Diagnosis: COVID-19  Physician:Dr Wright   Procedure: [facility] Med: casirivimab\imdevimab infusion - Provided patient with casirivimab\imdevimab fact sheet for patients, parents and caregivers prior to infusion.  Complications: No immediate complications noted.  Discharge: Discharged home   Brooke Farrell 07/18/2020  

## 2020-07-18 NOTE — Discharge Instructions (Signed)

## 2020-08-02 ENCOUNTER — Ambulatory Visit (INDEPENDENT_AMBULATORY_CARE_PROVIDER_SITE_OTHER): Payer: BC Managed Care – PPO

## 2020-08-02 DIAGNOSIS — J309 Allergic rhinitis, unspecified: Secondary | ICD-10-CM

## 2020-08-09 ENCOUNTER — Ambulatory Visit (INDEPENDENT_AMBULATORY_CARE_PROVIDER_SITE_OTHER): Payer: BC Managed Care – PPO

## 2020-08-09 DIAGNOSIS — J309 Allergic rhinitis, unspecified: Secondary | ICD-10-CM

## 2020-08-16 ENCOUNTER — Other Ambulatory Visit: Payer: Self-pay | Admitting: Allergy & Immunology

## 2020-08-16 ENCOUNTER — Ambulatory Visit (INDEPENDENT_AMBULATORY_CARE_PROVIDER_SITE_OTHER): Payer: BC Managed Care – PPO

## 2020-08-16 DIAGNOSIS — J309 Allergic rhinitis, unspecified: Secondary | ICD-10-CM | POA: Diagnosis not present

## 2020-08-22 ENCOUNTER — Other Ambulatory Visit: Payer: Self-pay

## 2020-08-22 ENCOUNTER — Ambulatory Visit: Payer: BC Managed Care – PPO | Admitting: Orthotics

## 2020-08-22 ENCOUNTER — Ambulatory Visit (INDEPENDENT_AMBULATORY_CARE_PROVIDER_SITE_OTHER): Payer: BC Managed Care – PPO | Admitting: Podiatry

## 2020-08-22 DIAGNOSIS — M722 Plantar fascial fibromatosis: Secondary | ICD-10-CM | POA: Diagnosis not present

## 2020-08-22 NOTE — Patient Instructions (Signed)

## 2020-08-22 NOTE — Progress Notes (Signed)
P/up f/o and very pleased.

## 2020-08-23 ENCOUNTER — Ambulatory Visit (INDEPENDENT_AMBULATORY_CARE_PROVIDER_SITE_OTHER): Payer: BC Managed Care – PPO

## 2020-08-23 DIAGNOSIS — J309 Allergic rhinitis, unspecified: Secondary | ICD-10-CM | POA: Diagnosis not present

## 2020-08-23 NOTE — Progress Notes (Signed)
Subjective: Brooke Farrell presents to the office today for follow-up of right heel pain.  She states that she is doing better.  She also presents to pick up orthotics.  Since I last saw her she did develop Covid and she states that she was not doing any exercises or pain attention to her feet.  Now that she is been feeling better she is to get back to doing the exercises but she has very minimal discomfort.  No recent injury or falls otherwise since I last saw her no other changes.  Objective: AAO x3, NAD DP/PT pulses palpable bilaterally, CRT less than 3 seconds There is minimal tenderness on plantar medial tubercle of the calcaneus at the insertion of the fascia on the right foot.  No pain with lateral compression of calcaneus.  No pain with Achilles tendon.  Flexor, extensor tendons appear to be intact.  MMT 5/5.  No other areas of tenderness. No pain with calf compression, swelling, warmth, erythema  Assessment: Right heel pain, plantar fasciitis -improved  Plan: -All treatment options discussed with the patient including all alternatives, risks, complications.  -Orthotics were dispensed today by Brooke Farrell -Discussed with continue stretching, icing daily.  Discussed shoe modifications continue with orthotics.  We held off on steroid injection today we will consider doing this in the future if there is any reoccurrence. We will see her back as needed.  Nurse call any questions or concerns any changes.  Brooke Farrell DPM

## 2020-08-30 ENCOUNTER — Ambulatory Visit (INDEPENDENT_AMBULATORY_CARE_PROVIDER_SITE_OTHER): Payer: BC Managed Care – PPO

## 2020-08-30 DIAGNOSIS — J309 Allergic rhinitis, unspecified: Secondary | ICD-10-CM | POA: Diagnosis not present

## 2020-09-06 ENCOUNTER — Ambulatory Visit (INDEPENDENT_AMBULATORY_CARE_PROVIDER_SITE_OTHER): Payer: Medicare Other

## 2020-09-06 DIAGNOSIS — J309 Allergic rhinitis, unspecified: Secondary | ICD-10-CM

## 2020-09-07 DIAGNOSIS — J3081 Allergic rhinitis due to animal (cat) (dog) hair and dander: Secondary | ICD-10-CM

## 2020-09-07 NOTE — Progress Notes (Signed)
VIALS EXP 09-07-21 °

## 2020-09-08 DIAGNOSIS — J3089 Other allergic rhinitis: Secondary | ICD-10-CM | POA: Diagnosis not present

## 2020-09-13 ENCOUNTER — Ambulatory Visit (INDEPENDENT_AMBULATORY_CARE_PROVIDER_SITE_OTHER): Payer: Medicare Other | Admitting: Allergy & Immunology

## 2020-09-13 DIAGNOSIS — J309 Allergic rhinitis, unspecified: Secondary | ICD-10-CM | POA: Diagnosis not present

## 2020-09-29 ENCOUNTER — Ambulatory Visit (INDEPENDENT_AMBULATORY_CARE_PROVIDER_SITE_OTHER): Payer: Medicare Other

## 2020-09-29 ENCOUNTER — Other Ambulatory Visit: Payer: Self-pay

## 2020-09-29 ENCOUNTER — Ambulatory Visit
Admission: RE | Admit: 2020-09-29 | Discharge: 2020-09-29 | Disposition: A | Discharge status: Home / Self Care | Payer: Medicare Other | Source: Ambulatory Visit

## 2020-09-29 VITALS — BP 133/87 | HR 75 | Temp 98.5°F | Resp 18

## 2020-09-29 DIAGNOSIS — M2560 Stiffness of unspecified joint, not elsewhere classified: Secondary | ICD-10-CM

## 2020-09-29 DIAGNOSIS — Z96642 Presence of left artificial hip joint: Secondary | ICD-10-CM

## 2020-09-29 DIAGNOSIS — M25552 Pain in left hip: Secondary | ICD-10-CM | POA: Diagnosis not present

## 2020-09-29 DIAGNOSIS — M545 Low back pain, unspecified: Secondary | ICD-10-CM

## 2020-09-29 DIAGNOSIS — Z9660 Presence of unspecified orthopedic joint implant: Secondary | ICD-10-CM | POA: Diagnosis not present

## 2020-09-29 DIAGNOSIS — T148XXA Other injury of unspecified body region, initial encounter: Secondary | ICD-10-CM

## 2020-09-29 IMAGING — DX DG HIP (WITH OR WITHOUT PELVIS) 2-3V*L*
3 series · 3 of 3 positions shown · non-contrast
Comparison: None.

CLINICAL DATA: Left hip pain.  Prior hip replacement.

EXAM:
DG HIP (WITH OR WITHOUT PELVIS) 2-3V LEFT

[pelvis ap]
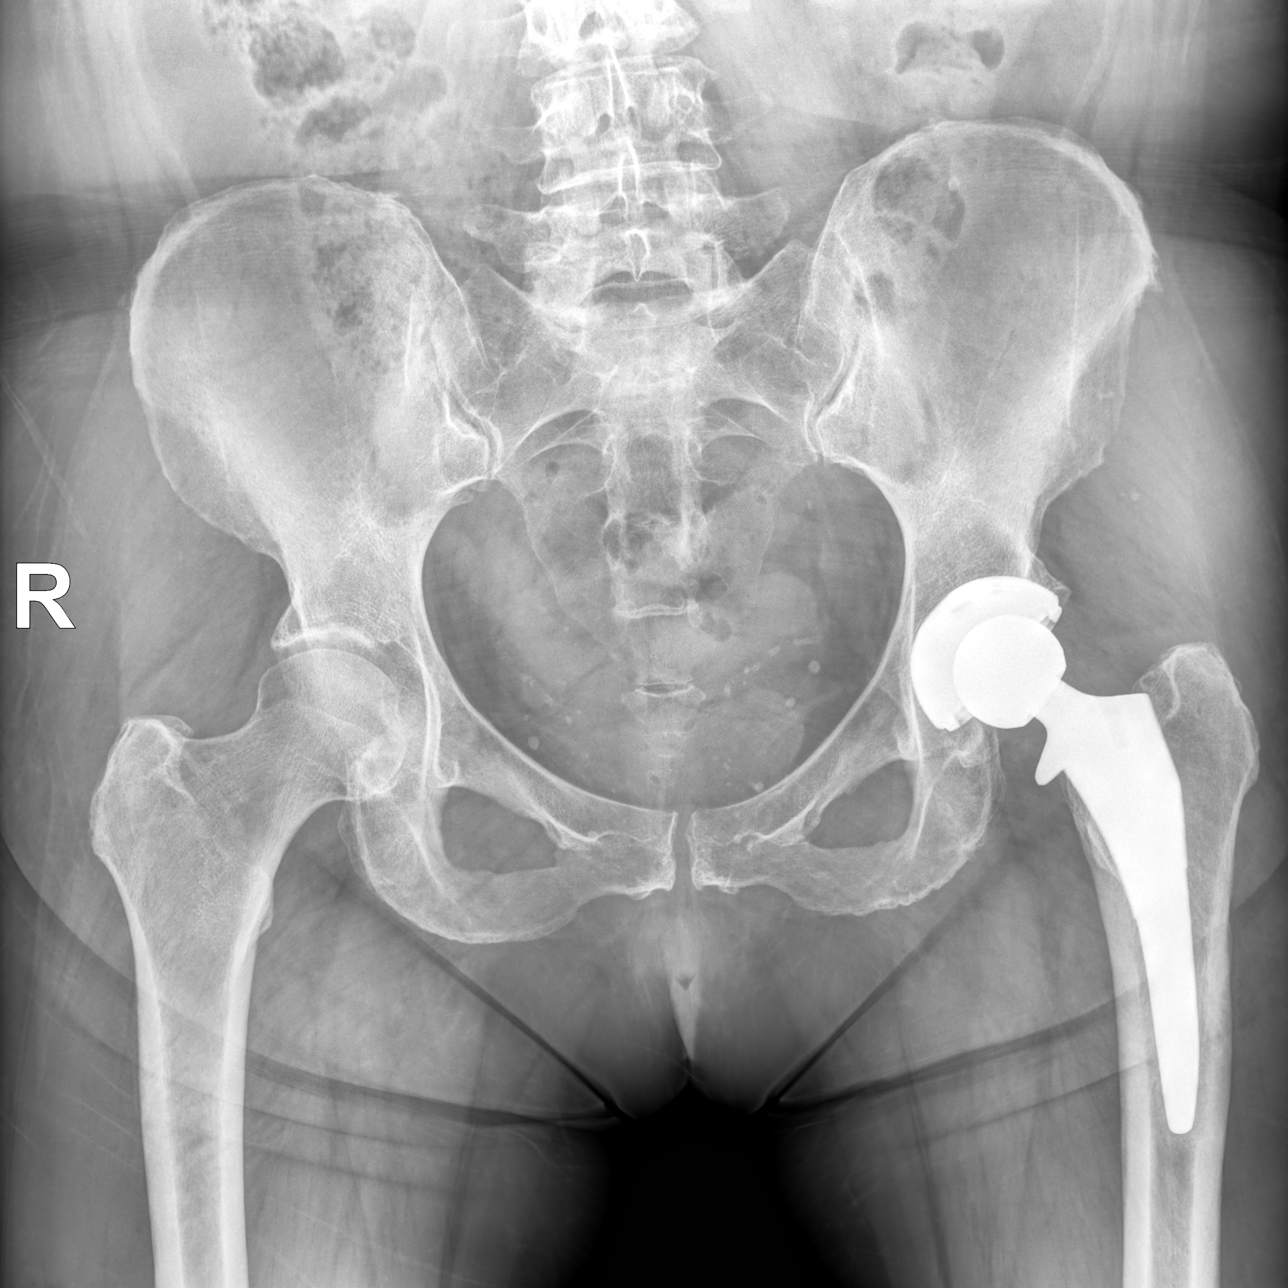

[hip ap]
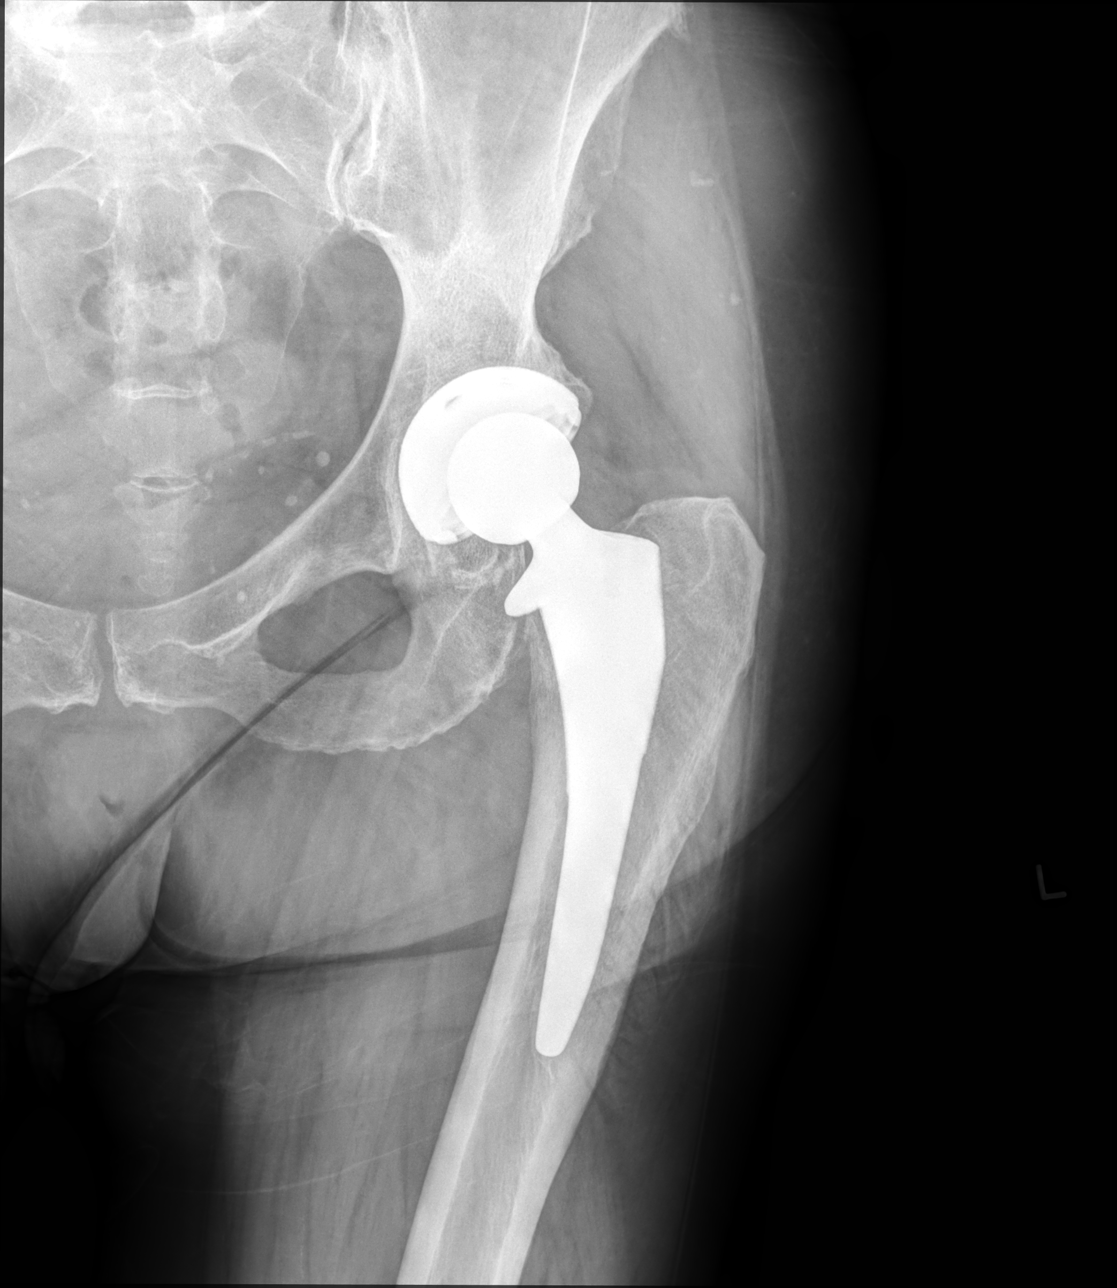

[hip (frog leg)]
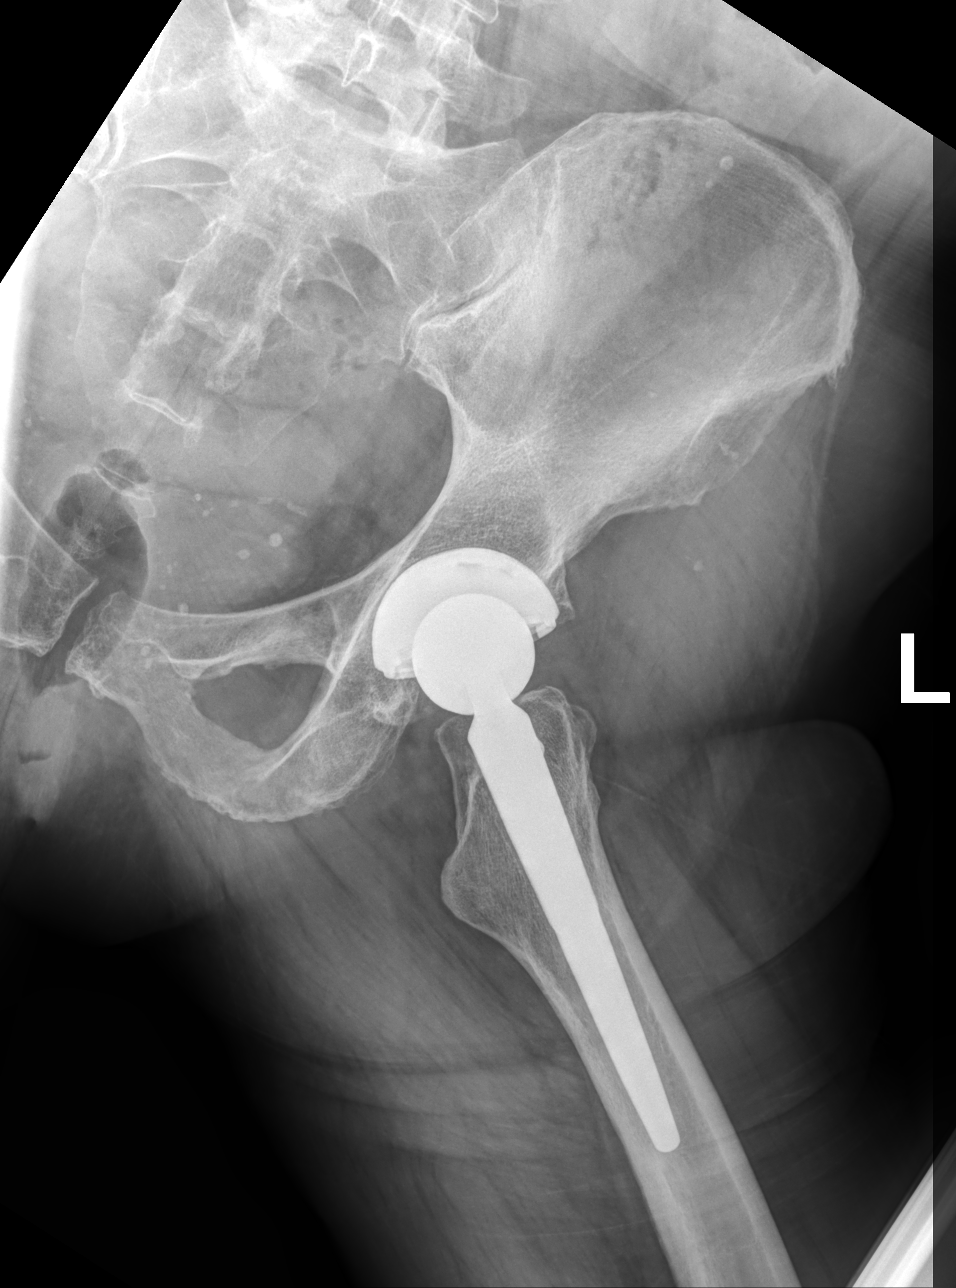

[3 of 3 positions shown; findings below may reference images not displayed]

FINDINGS: Sequelae of left total hip arthroplasty are identified. There is no
periprosthetic lucency suggestive of loosening or infection. No
acute fracture or dislocation is identified. Right hip joint space
width is preserved. Small calcifications in the pelvis likely
represent phleboliths.
IMPRESSION: Left hip arthroplasty without evidence of acute osseous abnormality.

## 2020-09-29 MED ORDER — DEXAMETHASONE SODIUM PHOSPHATE 10 MG/ML IJ SOLN
10.0000 mg | Freq: Once | INTRAMUSCULAR | Status: AC
  Administered 2020-09-29: 10 mg via INTRAMUSCULAR

## 2020-09-29 MED ORDER — HYDROCODONE-ACETAMINOPHEN 5-325 MG PO TABS
1.0000 | ORAL_TABLET | Freq: Four times a day (QID) | ORAL | 0 refills | Status: AC | PRN
Start: 2020-09-29 — End: 2020-10-02

## 2020-09-29 MED ORDER — KETOROLAC TROMETHAMINE 30 MG/ML IJ SOLN
30.0000 mg | Freq: Once | INTRAMUSCULAR | Status: AC
  Administered 2020-09-29: 30 mg via INTRAMUSCULAR

## 2020-09-29 MED ORDER — TIZANIDINE HCL 4 MG PO TABS: 4.0000 mg | ORAL_TABLET | Freq: Four times a day (QID) | ORAL | 0 refills | Status: DC | PRN

## 2020-09-29 NOTE — Discharge Instructions (Addendum)
Xray was negative for misalignment or fracture. Artificial joint in place.  Take ibuprofen as needed. Rest your back.  May apply heat to the area as well.  I have sent in tizanidine for you to take every 6 hours as needed for muscle spasms  I have given you a paper prescription for Norco for you to take 1 tablet every 6 hours as needed for pain  May also use topical rubs such as icy hot or Biofreeze  Follow-up with orthopedics if symptoms are persisting  Follow up with your primary care provider or an orthopedist if you symptoms continue or worsen;  Or if you develop new symptoms, such as numbness, tingling, or weakness.

## 2020-09-29 NOTE — ED Provider Notes (Signed)
[facility]   [id number] 09/29/20 Arrival Time: 1453  UD:JSHFW PAIN  SUBJECTIVE: History from: patient. Brooke Farrell is a 64 y.o. female complains of left low back pain and left hip pain that began last night. Denies a precipitating event or specific injury.  Reports that she was up on her feet all day yesterday. That is the only thing she can think of that may have caused her pain. 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  Reports that she had 2 pain pills left, and that she took one last night, and 1 this morning. Reports that they helped minimally. Describes the pain as constant and achy in character. Has tried OTC medications without relief.  Symptoms are made worse with activity. Denies similar symptoms in the past. Reports that she was unable to sleep last night, that she can stand for a bit and then has to lay down for a bit. Reports that she is uncomfortable in any position. Denies fever, chills, erythema, ecchymosis, effusion, weakness, numbness and tingling, saddle paresthesias, loss of bowel or bladder function.      ROS: As per HPI.  All other pertinent ROS negative.     Past Medical History:  Diagnosis Date  . Acid reflux   . Anxiety   . Asthma    Past Surgical History:  Procedure Laterality Date  . AUGMENTATION MAMMAPLASTY  1985  . NO PAST SURGERIES     Allergies  Allergen Reactions  . Prednisone Other (See Comments)    Patient reports having very bad mood swings and was very angry when on this drug  . Robitussin (Alcohol Free)  [Guaifenesin] Swelling  . Sulfa Antibiotics Swelling  . Corticosteroids   . Amoxicillin Diarrhea   No current facility-administered medications on file prior to encounter.   Current Outpatient Medications on File Prior to Encounter  Medication Sig Dispense Refill  . LORazepam (ATIVAN) 0.5 MG tablet Take by mouth.    . zolpidem (AMBIEN) 10 MG tablet TAKE (1) TABLET BY MOUTH AT BEDTIME AS NEEDED FOR SLEEP.    Marland Kitchen  albuterol (VENTOLIN HFA) 108 (90 Base) MCG/ACT inhaler INHALE 2 PUFFS INTO THE LUNGS EVERY 4 (FOUR) HOURS AS NEEDED FOR WHEEZING OR SHORTNESS OF BREATH. 6.7 each 1  . azelastine (ASTELIN) 0.1 % nasal spray Place 1 spray into both nostrils daily. Use in each nostril as directed 90 mL 1  . cetirizine (ZYRTEC) 10 MG tablet Take 10 mg by mouth daily as needed.     Marland Kitchen Dexlansoprazole 30 MG capsule Take 30 mg by mouth daily as needed.    . Eluxadoline (VIBERZI) 75 MG TABS Take 75 mg by mouth daily.    Marland Kitchen FLUoxetine (PROZAC) 20 MG capsule Take 40 mg by mouth daily.    Marland Kitchen FLUoxetine (PROZAC) 40 MG capsule Take by mouth daily. (Patient not taking: Reported on 07/14/2020)    . LORazepam (ATIVAN) 0.5 MG tablet Take by mouth.    . metFORMIN (GLUCOPHAGE-XR) 500 MG 24 hr tablet Take 1 tablet by mouth daily with breakfast.    . montelukast (SINGULAIR) 10 MG tablet Take 1 tablet (10 mg total) by mouth daily as needed. 90 tablet 1  . pneumococcal 13-valent conjugate vaccine (PREVNAR 13) SUSP injection Prevnar 13 (PF) 0.5 mL intramuscular syringe    . traZODone (DESYREL) 50 MG tablet     . zolpidem (AMBIEN) 10 MG tablet Take 10 mg by mouth at bedtime as needed.     Social History  Socioeconomic History  . Marital status: Married    Spouse name: Not on file  . Number of children: Not on file  . Years of education: Not on file  . Highest education level: Not on file  Occupational History  . Not on file  Tobacco Use  . Smoking status: Never Smoker  . Smokeless tobacco: Never Used  Vaping Use  . Vaping Use: Never used  Substance and Sexual Activity  . Alcohol use: Yes    Alcohol/week: 0.0 standard drinks    Comment: RARE  . Drug use: No  . Sexual activity: Not Currently    Partners: Male    Birth control/protection: None    Comment: 1st intercourse- 16, partners- 6, married- 25 yrs  Other Topics Concern  . Not on file  Social History Narrative  . Not on file   Social Determinants of Health    Financial Resource Strain:   . Difficulty of Paying Living Expenses: Not on file  Food Insecurity:   . Worried About Programme researcher, broadcasting/film/video in the Last Year: Not on file  . Ran Out of Food in the Last Year: Not on file  Transportation Needs:   . Lack of Transportation (Medical): Not on file  . Lack of Transportation (Non-Medical): Not on file  Physical Activity:   . Days of Exercise per Week: Not on file  . Minutes of Exercise per Session: Not on file  Stress:   . Feeling of Stress : Not on file  Social Connections:   . Frequency of Communication with Friends and Family: Not on file  . Frequency of Social Gatherings with Friends and Family: Not on file  . Attends Religious Services: Not on file  . Active Member of Clubs or Organizations: Not on file  . Attends Banker Meetings: Not on file  . Marital Status: Not on file  Intimate Partner Violence:   . Fear of Current or Ex-Partner: Not on file  . Emotionally Abused: Not on file  . Physically Abused: Not on file  . Sexually Abused: Not on file   Family History  Problem Relation Age of Onset  . Breast cancer Mother   . Cancer Father        bone  . Diabetes Father   . Hypertension Father   . Heart attack Father   . Allergic rhinitis Neg Hx   . Angioedema Neg Hx   . Asthma Neg Hx   . Atopy Neg Hx   . Eczema Neg Hx   . Immunodeficiency Neg Hx   . Urticaria Neg Hx     OBJECTIVE:  Vitals:   09/29/20 1532  BP: 133/87  Pulse: 75  Resp: 18  Temp: 98.5 F (36.9 C)  TempSrc: Oral  SpO2: 96%    General appearance: ALERT; in no acute distress; appears uncomfortable, changing positions Head: NCAT Lungs: Normal respiratory effort CV: pulses 2+ bilaterally. Cap refill < 2 seconds Musculoskeletal:  Inspection: Skin warm, dry, clear and intact without obvious erythema, effusion, or ecchymosis.  Palpation: L low back and anterior L hip tender to palpation ROM: limited ROM active and passive with bending and  twisting and changing positions Skin: warm and dry Neurologic: Ambulates without difficulty; Sensation intact about the upper/ lower extremities Psychological: alert and cooperative; normal mood and affect  DIAGNOSTIC STUDIES:  DG Hip Unilat With Pelvis 2-3 Views Left  Result Date: 09/29/2020 CLINICAL DATA:  Left hip pain.  Prior hip replacement. EXAM: DG HIP (WITH  OR WITHOUT PELVIS) 2-3V LEFT COMPARISON:  None. 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 There is no periprosthetic lucency suggestive of loosening or infection. No acute fracture or dislocation is identified. Right hip joint space width is preserved. Small calcifications in the pelvis likely represent phleboliths. IMPRESSION: Left hip arthroplasty without evidence of acute osseous abnormality. Electronically Signed   By: Sebastian Ache M.D.   On: 09/29/2020 16:57     ASSESSMENT & PLAN:  1. Muscle strain   2. S/P joint replacement   3. Limited joint range of motion (ROM)   4. Left hip pain   5. Acute left-sided low back pain without sciatica     Meds ordered this encounter  Medications  . HYDROcodone-acetaminophen (NORCO/VICODIN) 5-325 MG tablet    Sig: Take 1 tablet by mouth every 6 (six) hours as needed for up to 3 days for moderate pain or severe pain.    Dispense:  10 tablet    Refill:  0    Order Specific Question:   Supervising Provider    Answer:   Merrilee Jansky [id number]  . tiZANidine (ZANAFLEX) 4 MG tablet    Sig: Take 1 tablet (4 mg total) by mouth every 6 (six) hours as needed for muscle spasms.    Dispense:  30 tablet    Refill:  0    Order Specific Question:   Supervising Provider    Answer:   Merrilee Jansky [id number]  . dexamethasone (DECADRON) injection 10 mg  . ketorolac (TORADOL) 30 MG/ML injection 30 mg   Xrays negative for fracture or dislocation, hardware for artificial joint in place Decadron 10 mg IM in office today Toradol 30mg  IM in office today Prescribed  Norco Prescribed tizanidine Likely muscle strain from overuse Continue conservative management of rest, ice, and gentle stretches Take ibuprofen as needed for pain relief (may cause abdominal discomfort, ulcers, and GI bleeds avoid taking with other NSAIDs) Take cyclobenzaprine at nighttime for symptomatic relief. Avoid driving or operating heavy machinery while using medication. Follow up with ortho if symptoms persist Return or go to the ER if you have any new or worsening symptoms (fever, chills, chest pain, abdominal pain, changes in bowel or bladder habits, pain radiating into lower legs)   [place] Controlled Substances Registry consulted for this patient. I feel the risk/benefit ratio today is favorable for proceeding with this prescription for a controlled substance. Medication sedation precautions given.  Reviewed expectations re: course of current medical issues. Questions answered. Outlined signs and symptoms indicating need for more acute intervention. Patient verbalized understanding. After Visit Summary given.       , NP 09/29/20 1708

## 2020-09-29 NOTE — ED Triage Notes (Addendum)
Pt is here with back/leg pain that started yesterday after cooking all day, pt has taken Oxycodone and muscle relaxers to relieve discomfort.

## 2020-10-04 ENCOUNTER — Ambulatory Visit (INDEPENDENT_AMBULATORY_CARE_PROVIDER_SITE_OTHER): Payer: Medicare Other

## 2020-10-04 DIAGNOSIS — J309 Allergic rhinitis, unspecified: Secondary | ICD-10-CM | POA: Diagnosis not present

## 2020-10-05 ENCOUNTER — Ambulatory Visit: Payer: BC Managed Care – PPO

## 2020-10-11 ENCOUNTER — Encounter: Payer: BC Managed Care – PPO | Admitting: Obstetrics & Gynecology

## 2020-10-12 ENCOUNTER — Ambulatory Visit: Payer: 59 | Attending: Internal Medicine

## 2020-10-12 DIAGNOSIS — Z23 Encounter for immunization: Secondary | ICD-10-CM

## 2020-10-12 NOTE — Progress Notes (Signed)
   Covid-19 Vaccination Clinic  Name:  JANIYA MILLIRONS    MRN: [id number] DOB: 1956-02-19  10/12/2020  Ms. Sherwood was observed post Covid-19 immunization for 15 minutes without incident. She was provided with Vaccine Information Sheet and instruction to access the V-Safe system.   Ms. Mares was instructed to call 911 with any severe reactions post vaccine: Marland Kitchen Difficulty breathing  . Swelling of face and throat  . A fast heartbeat  . A bad rash all over body  . Dizziness and weakness   Immunizations Administered    Name Date Dose VIS Date Route   Pfizer COVID-19 Vaccine 10/12/2020  1:00 PM 0.3 mL 08/23/2020 Intramuscular   Manufacturer: [employer], [employer]   Lot: [id number]   NDC: [address]-3

## 2020-10-18 ENCOUNTER — Ambulatory Visit (INDEPENDENT_AMBULATORY_CARE_PROVIDER_SITE_OTHER): Payer: Medicare Other

## 2020-10-18 DIAGNOSIS — J309 Allergic rhinitis, unspecified: Secondary | ICD-10-CM

## 2020-11-01 ENCOUNTER — Ambulatory Visit (INDEPENDENT_AMBULATORY_CARE_PROVIDER_SITE_OTHER): Payer: Medicare Other

## 2020-11-01 DIAGNOSIS — J309 Allergic rhinitis, unspecified: Secondary | ICD-10-CM

## 2020-11-08 ENCOUNTER — Ambulatory Visit (INDEPENDENT_AMBULATORY_CARE_PROVIDER_SITE_OTHER): Payer: Medicare Other

## 2020-11-08 DIAGNOSIS — J309 Allergic rhinitis, unspecified: Secondary | ICD-10-CM | POA: Diagnosis not present

## 2020-11-15 ENCOUNTER — Ambulatory Visit (INDEPENDENT_AMBULATORY_CARE_PROVIDER_SITE_OTHER): Payer: Medicare Other

## 2020-11-15 DIAGNOSIS — J309 Allergic rhinitis, unspecified: Secondary | ICD-10-CM | POA: Diagnosis not present

## 2020-11-29 ENCOUNTER — Ambulatory Visit (INDEPENDENT_AMBULATORY_CARE_PROVIDER_SITE_OTHER): Payer: Medicare Other

## 2020-11-29 DIAGNOSIS — J309 Allergic rhinitis, unspecified: Secondary | ICD-10-CM

## 2020-12-06 ENCOUNTER — Ambulatory Visit (INDEPENDENT_AMBULATORY_CARE_PROVIDER_SITE_OTHER): Payer: Medicare Other

## 2020-12-06 DIAGNOSIS — J309 Allergic rhinitis, unspecified: Secondary | ICD-10-CM | POA: Diagnosis not present

## 2020-12-11 ENCOUNTER — Encounter: Payer: 59 | Admitting: Obstetrics & Gynecology

## 2020-12-11 ENCOUNTER — Other Ambulatory Visit: Payer: BC Managed Care – PPO | Admitting: Adult Health

## 2020-12-13 ENCOUNTER — Other Ambulatory Visit: Payer: Self-pay | Admitting: Physician Assistant

## 2020-12-13 ENCOUNTER — Ambulatory Visit (INDEPENDENT_AMBULATORY_CARE_PROVIDER_SITE_OTHER): Payer: Medicare Other

## 2020-12-13 DIAGNOSIS — K2100 Gastro-esophageal reflux disease with esophagitis, without bleeding: Secondary | ICD-10-CM

## 2020-12-13 DIAGNOSIS — J309 Allergic rhinitis, unspecified: Secondary | ICD-10-CM | POA: Diagnosis not present

## 2020-12-27 ENCOUNTER — Ambulatory Visit
Admission: RE | Admit: 2020-12-27 | Discharge: 2020-12-27 | Disposition: A | Discharge status: Home / Self Care | Payer: Medicare Other | Source: Ambulatory Visit | Attending: Physician Assistant | Admitting: Physician Assistant

## 2020-12-27 ENCOUNTER — Ambulatory Visit (INDEPENDENT_AMBULATORY_CARE_PROVIDER_SITE_OTHER): Payer: Medicare Other

## 2020-12-27 DIAGNOSIS — J309 Allergic rhinitis, unspecified: Secondary | ICD-10-CM

## 2020-12-27 DIAGNOSIS — K2100 Gastro-esophageal reflux disease with esophagitis, without bleeding: Secondary | ICD-10-CM

## 2020-12-27 IMAGING — US US ABDOMEN COMPLETE
1 series · 14 of 25 positions shown · non-contrast
Comparison: Ultrasound [DATE]

CLINICAL DATA: Gastroesophageal reflux

EXAM:
ABDOMEN ULTRASOUND COMPLETE

[Series 1: us abdomen complete · 0.22mm/px · 14 of 75 slices shown]
[im 1/75]
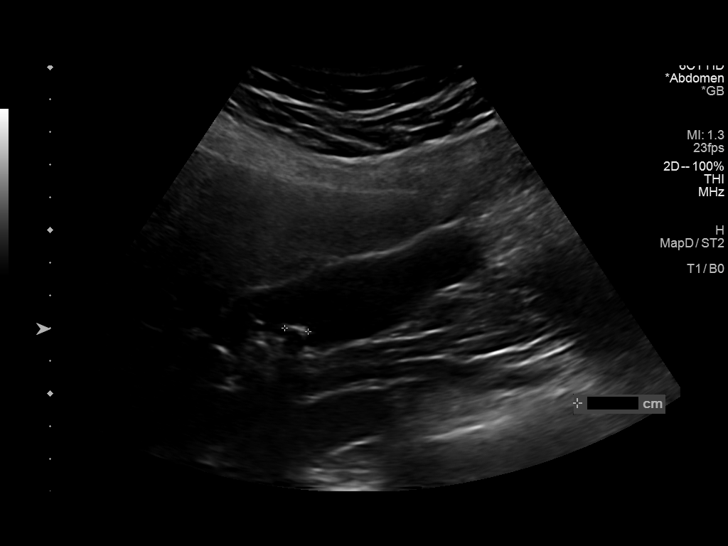
[im 7/75]
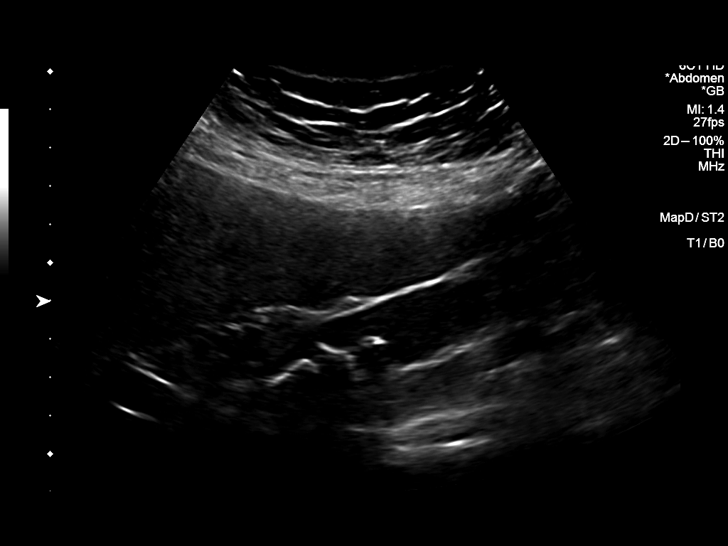
[im 13/75]
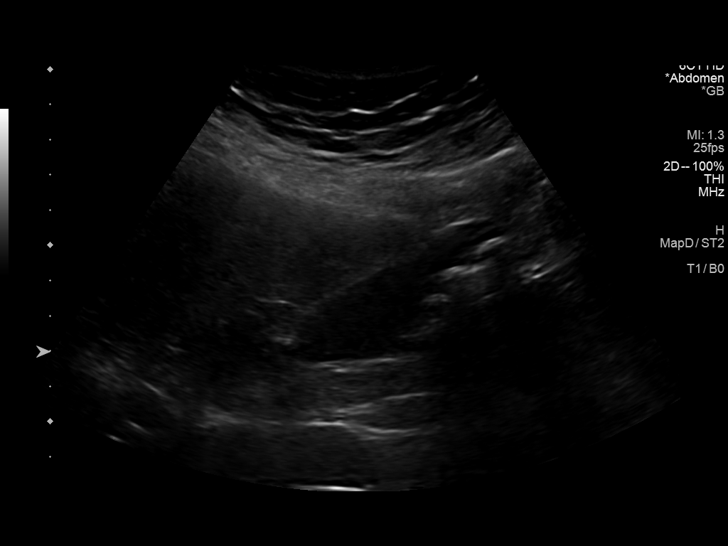
[im 19/75]
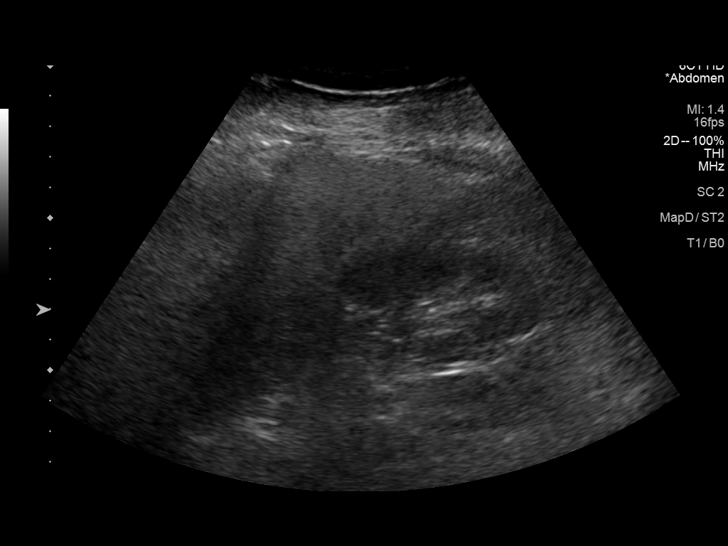
[im 25/75]
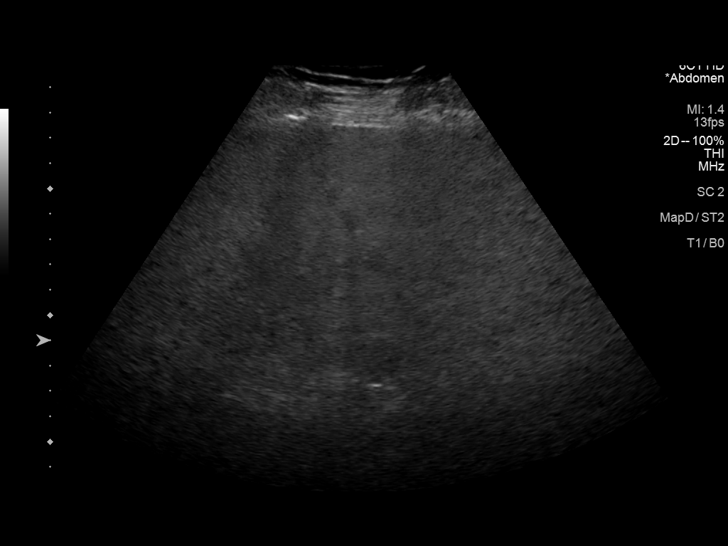
[im 28/75]
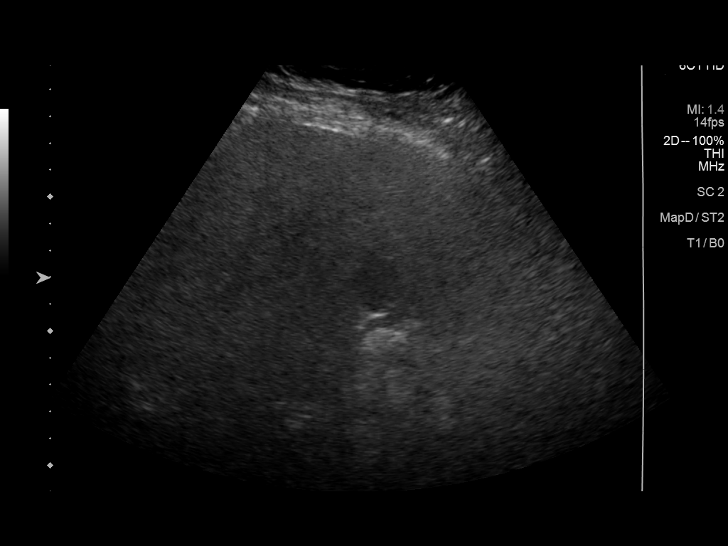
[im 34/75]
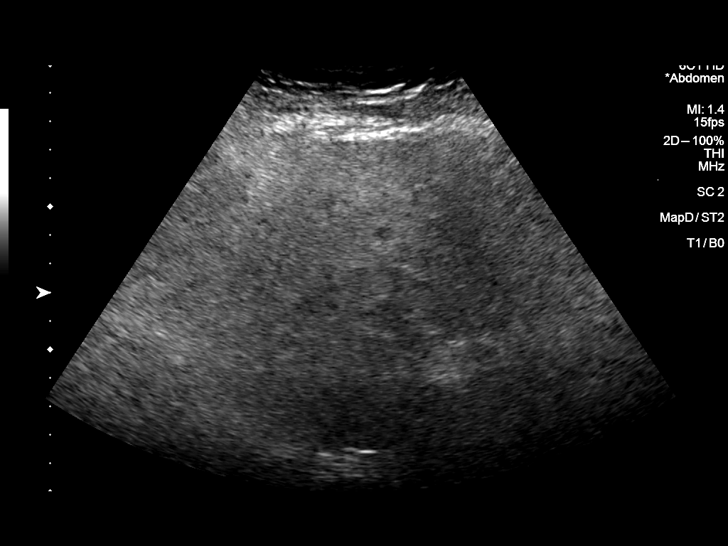
[im 41/75]
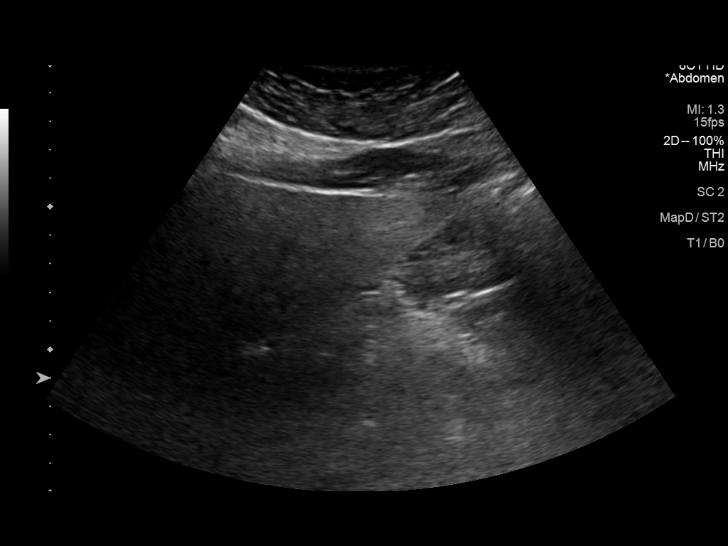
[im 47/75]
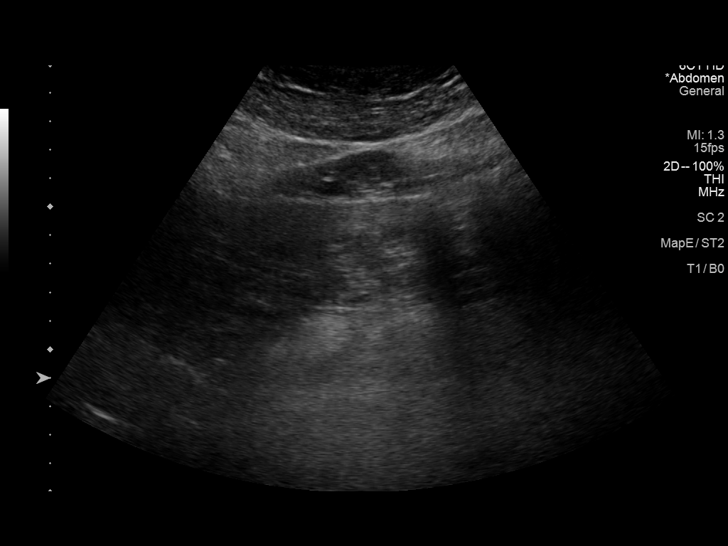
[im 50/75]
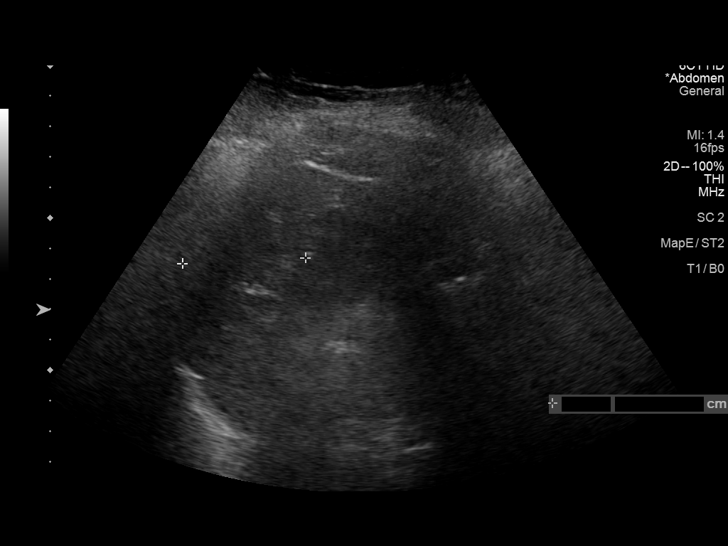
[im 56/75]
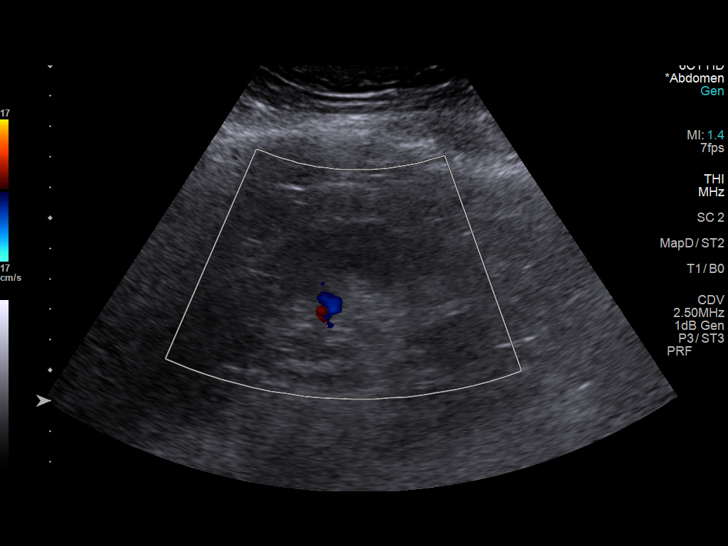
[im 62/75]
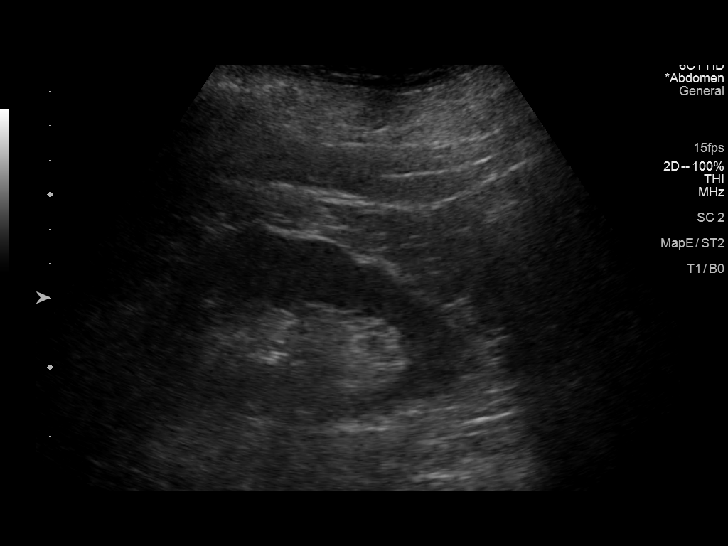
[im 68/75]
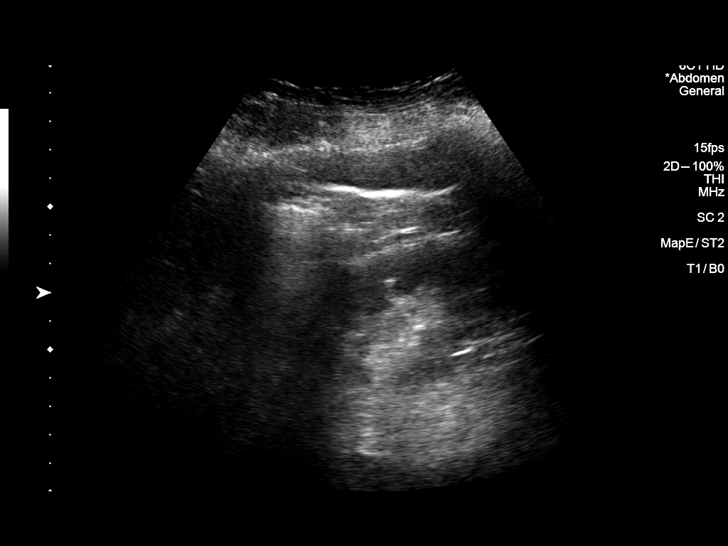
[im 75/75]
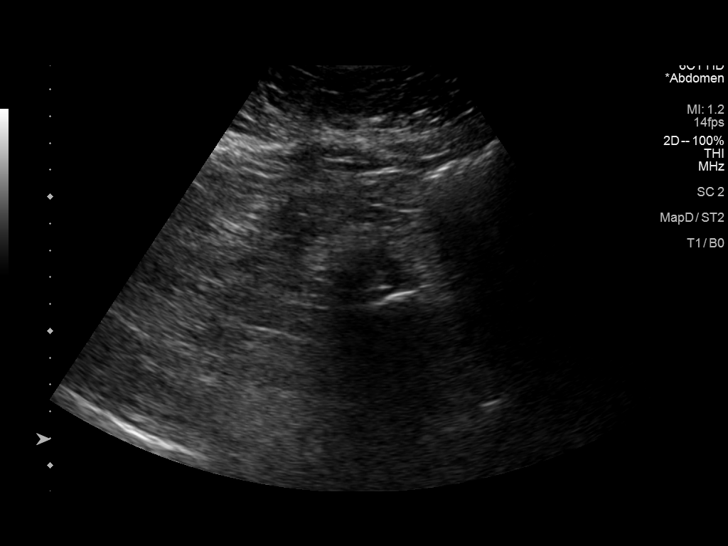

[14 of 25 positions shown; findings below may reference images not displayed]

FINDINGS: Gallbladder: Multiple shadowing stones measuring up to 7 mm. Normal
wall thickness. Negative sonographic Murphy.

Common bile duct: Diameter: 3.3 mm

Liver: Heterogenous echogenic liver. Benign appearing cyst in the
right hepatic lobe measuring 2.3 cm. Portal vein is patent on color
Doppler imaging with normal direction of blood flow towards the
liver.

IVC: Poorly visible due to bowel gas

Pancreas: Poorly visible due to bowel gas

Spleen: Size and appearance within normal limits.

Right Kidney: Length: 10.4 cm. Echogenicity within normal limits. No
mass or hydronephrosis visualized.

Left Kidney: Length: 10.4 cm. Echogenicity within normal limits. No
mass or hydronephrosis visualized.

Abdominal aorta: No aneurysm visualized.

Other findings: None.
IMPRESSION: 1. Cholelithiasis without sonographic evidence for acute
cholecystitis.
2. Heterogenous echogenic liver consistent with steatosis and or
hepatocellular disease.

## 2021-01-02 ENCOUNTER — Other Ambulatory Visit: Payer: Self-pay | Admitting: Physician Assistant

## 2021-01-02 DIAGNOSIS — R1313 Dysphagia, pharyngeal phase: Secondary | ICD-10-CM

## 2021-01-05 ENCOUNTER — Ambulatory Visit
Admission: RE | Admit: 2021-01-05 | Discharge: 2021-01-05 | Disposition: A | Discharge status: Home / Self Care | Payer: Medicare Other | Source: Ambulatory Visit | Attending: Physician Assistant | Admitting: Physician Assistant

## 2021-01-05 DIAGNOSIS — R1313 Dysphagia, pharyngeal phase: Secondary | ICD-10-CM

## 2021-01-05 IMAGING — RF DG ESOPHAGUS
7 series · 14 of 24 positions shown · non-contrast
Comparison: None.

CLINICAL DATA: Pharyngeal dysphagia.

EXAM:
ESOPHOGRAM / BARIUM SWALLOW / BARIUM TABLET STUDY
TECHNIQUE: Combined double contrast and single contrast examination performed
using effervescent crystals, thick barium liquid, and thin barium
liquid. The patient was observed with fluoroscopy swallowing a 13 mm
barium sulphate tablet.
FLUOROSCOPY TIME:  Fluoroscopy Time:  1 minutes 36 second
Radiation Exposure Index (if provided by the fluoroscopic device):
Number of Acquired Spot Images: 7

[Series 1: sequence · 0.28mm/px · 2 of 30 frames shown (1 of 6)]
[frame 5/30]
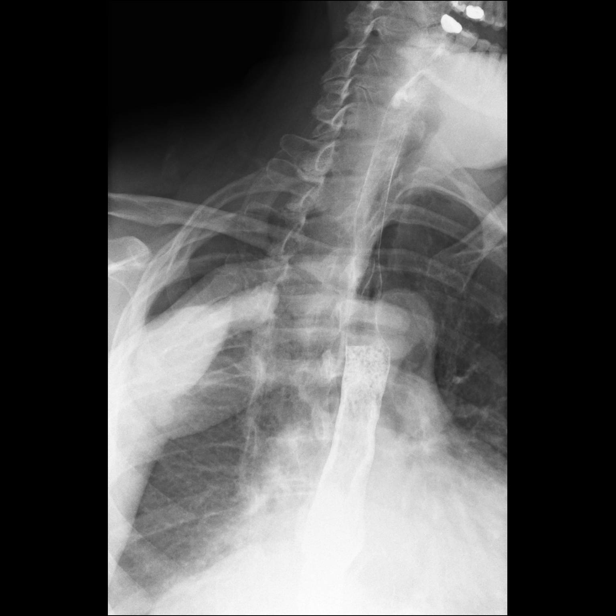
[frame 30/30]
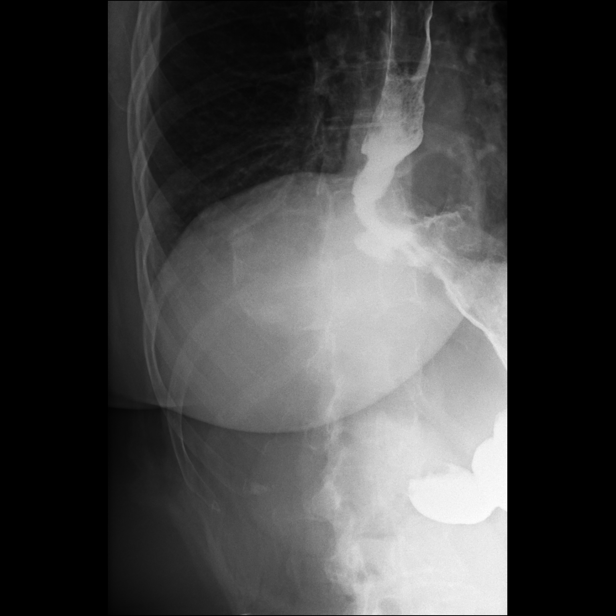

[Series 2: sequence · 0.28mm/px · 1 of 9 frames shown (2 of 6)]
[frame 5/9]
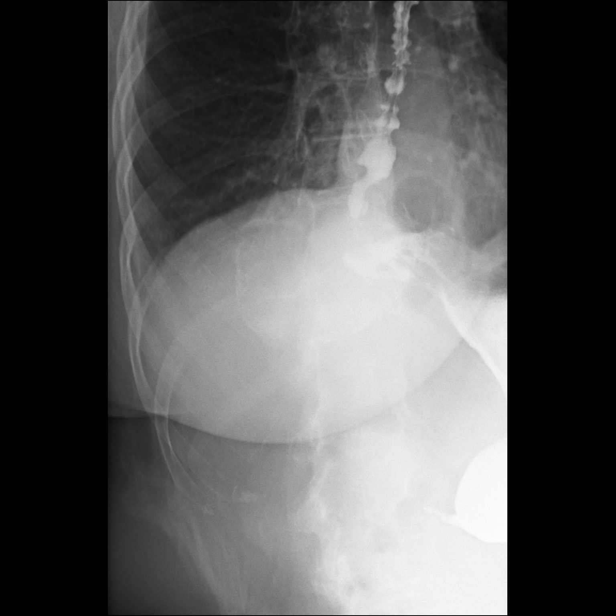

[Series 3: sequence · 0.28mm/px · 2 of 26 frames shown (3 of 6)]
[frame 6/26]
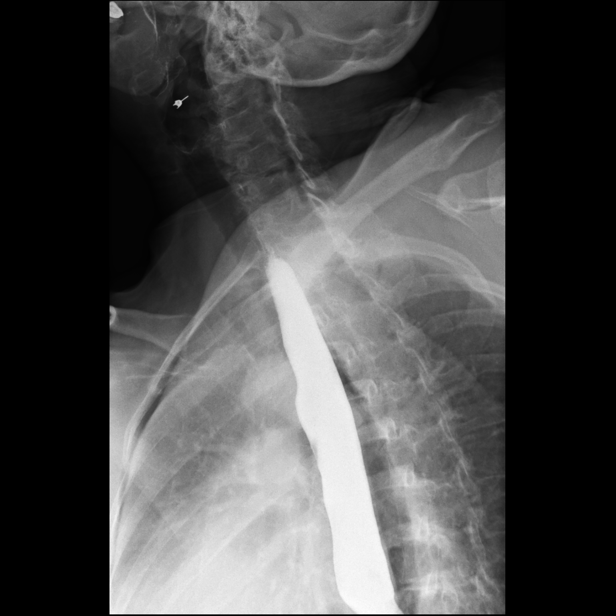
[frame 14/26]
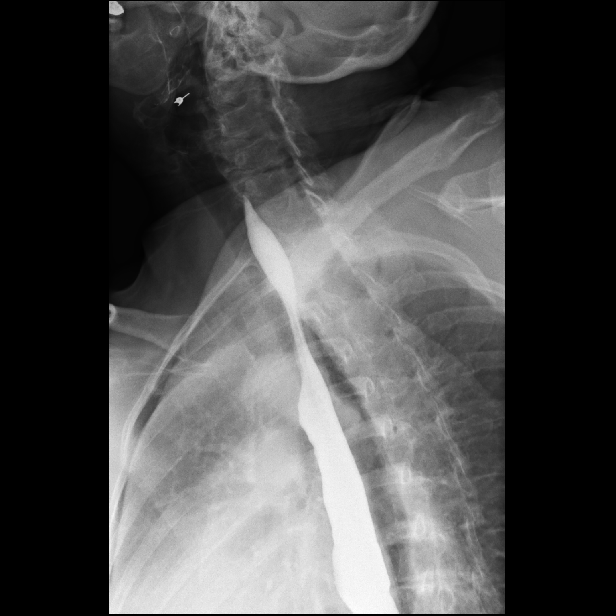

[Series 4: sequence · 0.28mm/px · 1 of 27 frames shown (4 of 6)]
[frame 5/27]
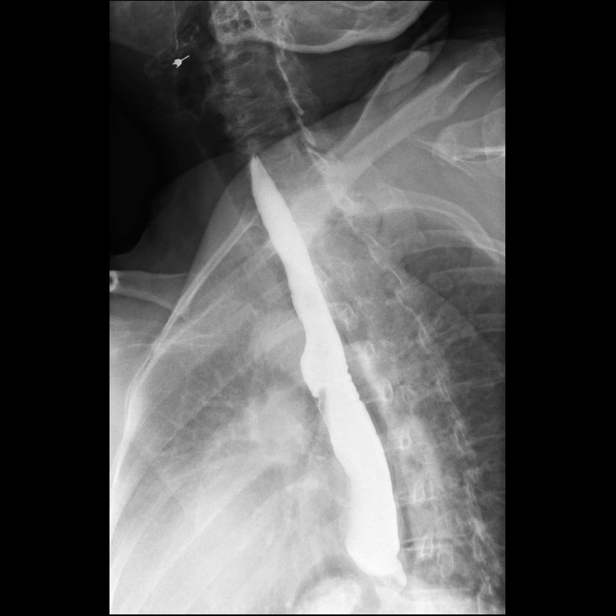

[Series 5: sequence · 0.28mm/px · 2 of 4 frames shown (5 of 6)]
[frame 1/4]
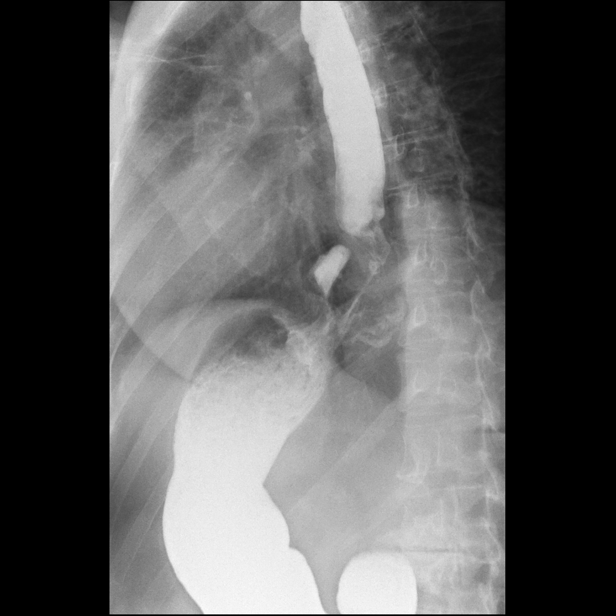
[frame 3/4]
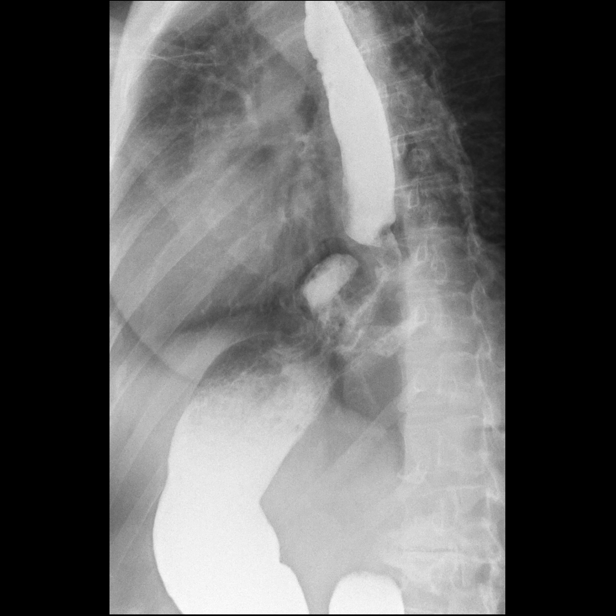

[Series 6: sequence · 0.28mm/px · 1 of 24 frames shown (6 of 6)]
[frame 11/24]
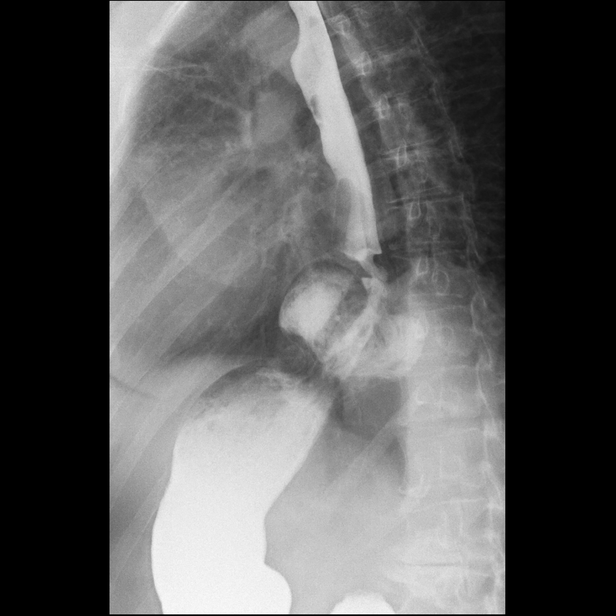

[Series 7: one shot · 0.14mm/px · 5 of 11 slices shown]
[im 1/11]
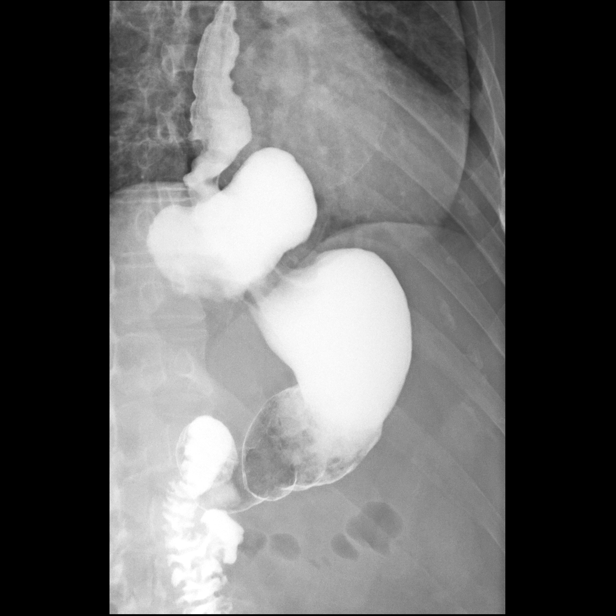
[im 4/11]
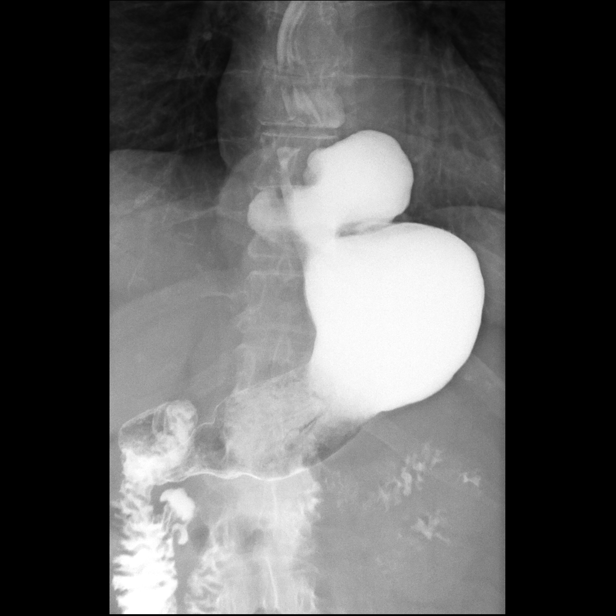
[im 5/11]
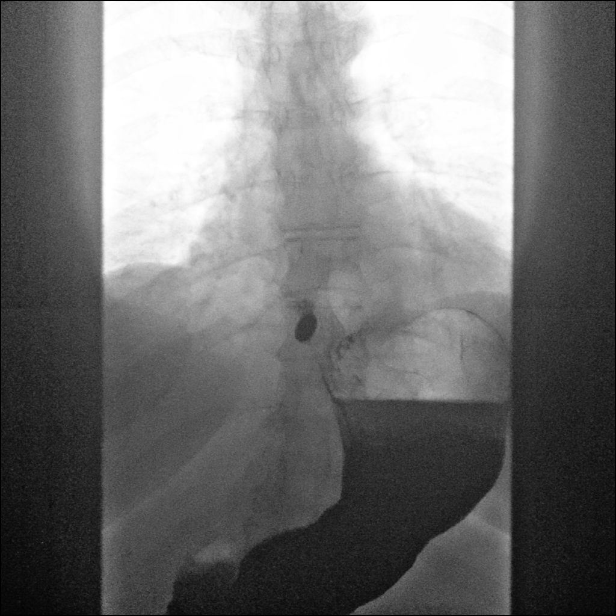
[im 8/11]
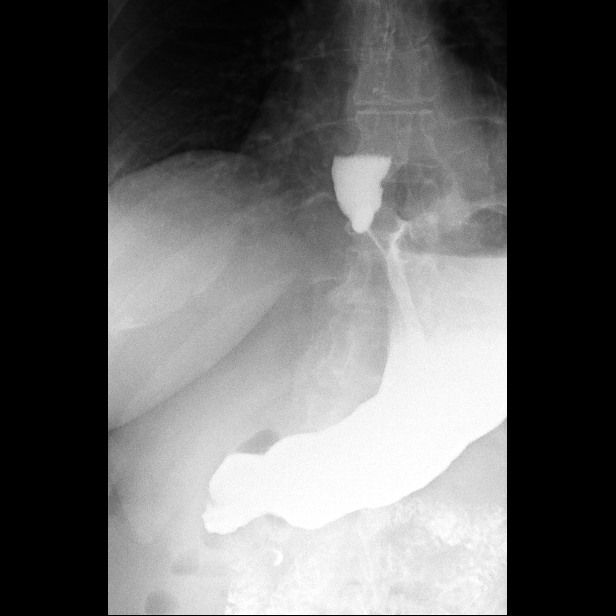
[im 11/11]
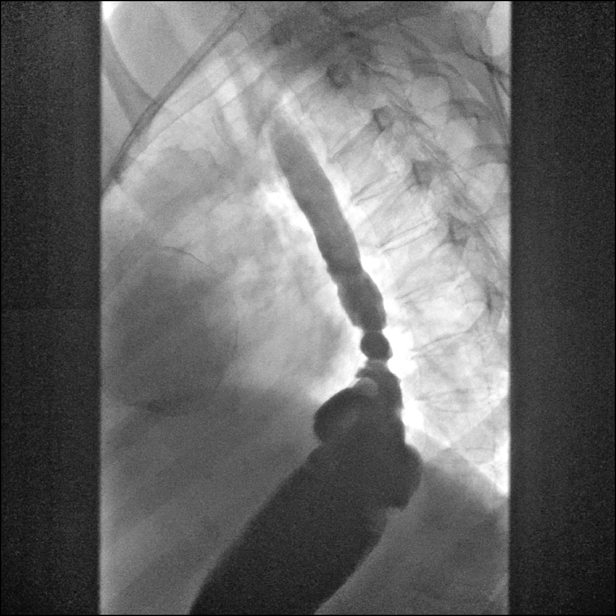

[14 of 24 positions shown; findings below may reference images not displayed]

FINDINGS: Pharyngeal phase of swallowing normal.  No aspiration

Esophageal dysmotility. Tertiary contractions in the mid and distal
esophagus.

Moderately large sliding hiatal hernia. There is a stricture of the
GE junction. Barium tablet did not pass through the stricture.
Benign-appearing stricture.

Moderate gastroesophageal reflux while drinking water.
IMPRESSION: Esophageal dysmotility

Moderately large hiatal hernia. Stricture at the GE junction. Barium
tablet did not pass the stricture

Moderate gastroesophageal reflux.

## 2021-01-10 ENCOUNTER — Ambulatory Visit (INDEPENDENT_AMBULATORY_CARE_PROVIDER_SITE_OTHER): Payer: Medicare Other

## 2021-01-10 DIAGNOSIS — J309 Allergic rhinitis, unspecified: Secondary | ICD-10-CM | POA: Diagnosis not present

## 2021-01-24 ENCOUNTER — Ambulatory Visit (INDEPENDENT_AMBULATORY_CARE_PROVIDER_SITE_OTHER): Payer: Medicare Other

## 2021-01-24 DIAGNOSIS — J309 Allergic rhinitis, unspecified: Secondary | ICD-10-CM | POA: Diagnosis not present

## 2021-02-05 ENCOUNTER — Ambulatory Visit: Payer: 59 | Admitting: Obstetrics & Gynecology

## 2021-02-07 ENCOUNTER — Ambulatory Visit (INDEPENDENT_AMBULATORY_CARE_PROVIDER_SITE_OTHER): Payer: Medicare Other

## 2021-02-07 DIAGNOSIS — J309 Allergic rhinitis, unspecified: Secondary | ICD-10-CM | POA: Diagnosis not present

## 2021-02-08 DIAGNOSIS — J3081 Allergic rhinitis due to animal (cat) (dog) hair and dander: Secondary | ICD-10-CM | POA: Diagnosis not present

## 2021-02-08 NOTE — Progress Notes (Signed)
VIALS EXP 02-08-22 

## 2021-02-09 DIAGNOSIS — J3089 Other allergic rhinitis: Secondary | ICD-10-CM | POA: Diagnosis not present

## 2021-02-15 ENCOUNTER — Other Ambulatory Visit: Payer: Self-pay | Admitting: Radiology

## 2021-02-15 DIAGNOSIS — N6310 Unspecified lump in the right breast, unspecified quadrant: Secondary | ICD-10-CM

## 2021-02-21 ENCOUNTER — Other Ambulatory Visit: Payer: Self-pay | Admitting: Radiology

## 2021-02-23 ENCOUNTER — Ambulatory Visit (INDEPENDENT_AMBULATORY_CARE_PROVIDER_SITE_OTHER): Payer: Medicare Other | Admitting: *Deleted

## 2021-02-23 DIAGNOSIS — J309 Allergic rhinitis, unspecified: Secondary | ICD-10-CM

## 2021-02-26 ENCOUNTER — Ambulatory Visit
Admission: RE | Admit: 2021-02-26 | Discharge: 2021-02-26 | Disposition: A | Discharge status: Home / Self Care | Payer: Medicare Other | Source: Ambulatory Visit | Attending: Radiology | Admitting: Radiology

## 2021-02-26 ENCOUNTER — Other Ambulatory Visit: Payer: Self-pay | Admitting: Radiology

## 2021-02-26 DIAGNOSIS — N6310 Unspecified lump in the right breast, unspecified quadrant: Secondary | ICD-10-CM

## 2021-02-26 IMAGING — MR MR BREAST BILAT WO/W CM
8 of 12 series · 32 of 48 positions shown · IV contrast (gadavist)
Comparison: None.

CLINICAL DATA: 64-year-old female presenting for diagnostic MRI.
Patient had a recent biopsy of the right breast demonstrating fat
necrosis. Known bilateral implant rupture.

LABS:  None.
EXAM:
BILATERAL BREAST MRI WITH AND WITHOUT CONTRAST
TECHNIQUE: Multiplanar, multisequence MR images of both breasts were obtained
prior to and following the intravenous administration of 9 ml of
Gadavist

[Series 2: t2_tirm_tra ipat (a-p) · axial · 3.0mm · 0.78mm/px · 1 of 58 slices shown]
[im 1/58]
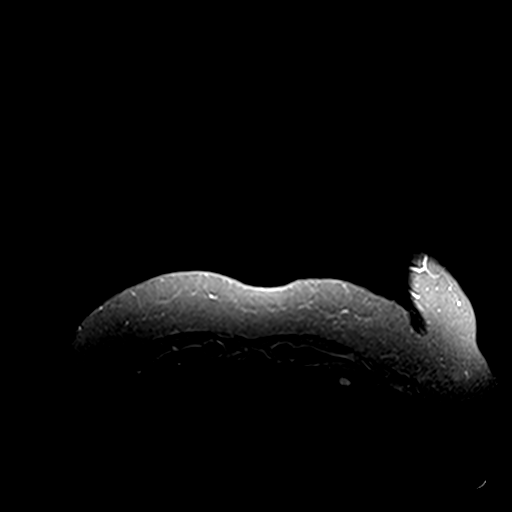

[Series 3: fl3d pre-cm no · axial · non-contrast · 1.2mm · 1.04mm/px · z∈[-82,+89]mm · 5 of 144 slices shown]
[im 1/144]
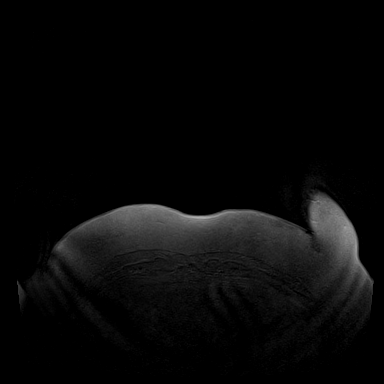
[im 36/144]
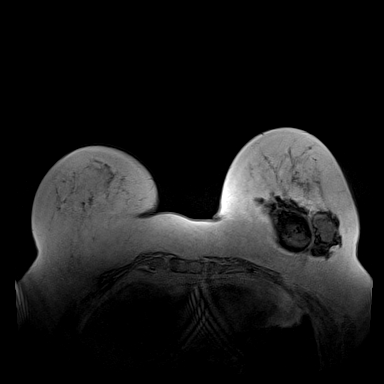
[im 72/144]
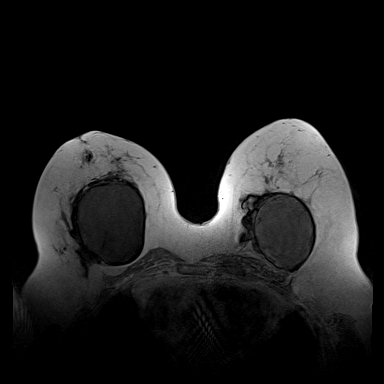
[im 108/144]
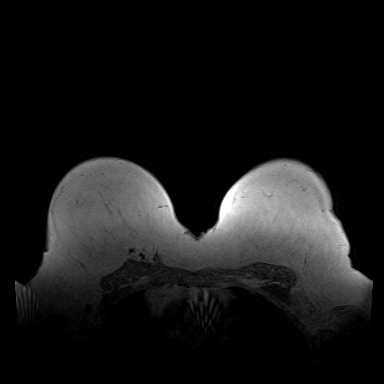
[im 144/144]
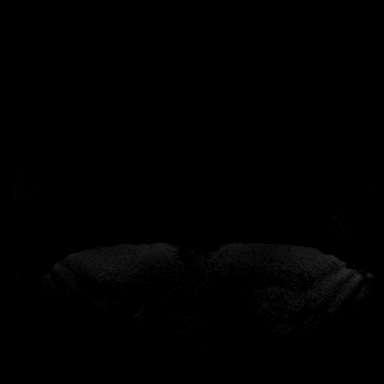

[Series 4: fl3d pre-cm · axial · non-contrast · 1.2mm · 1.04mm/px · z∈[-82,+89]mm · 5 of 144 slices shown]
[im 1/144]
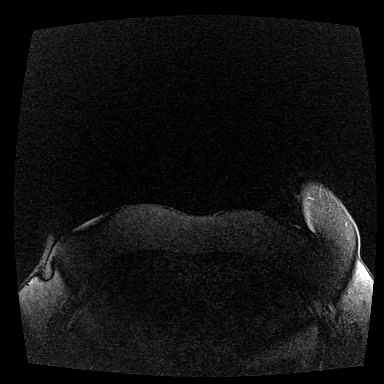
[im 36/144]
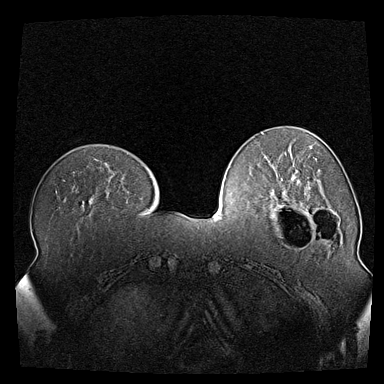
[im 72/144]
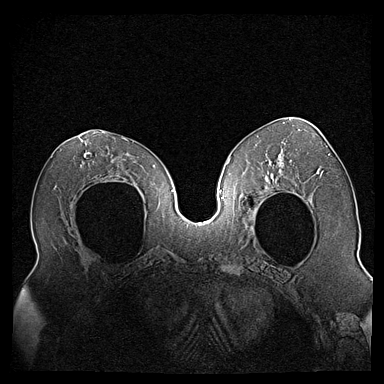
[im 108/144]
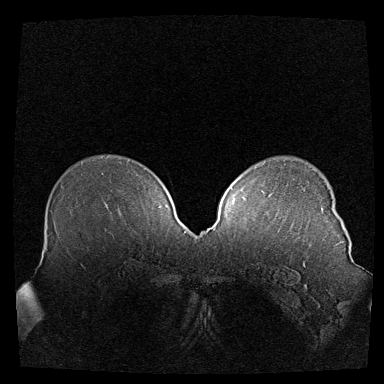
[im 144/144]
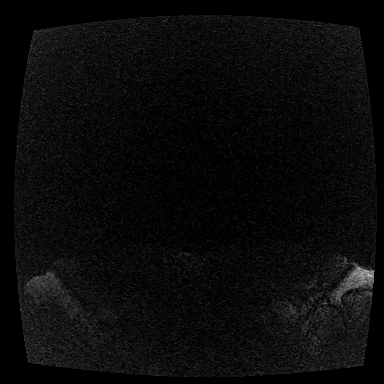

[Series 5: fl3d post-cm 20 · axial · 1.2mm · 1.04mm/px · z∈[-82,+89]mm · 5 of 144 slices shown (1 of 3)]
[im 1/144]
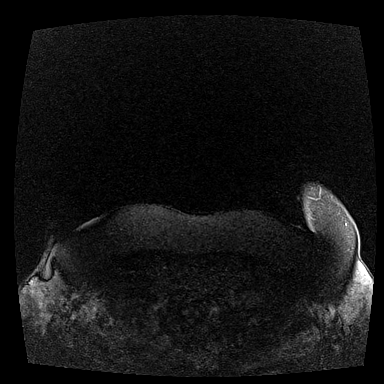
[im 36/144]
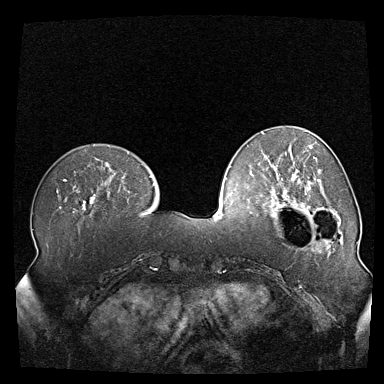
[im 72/144]
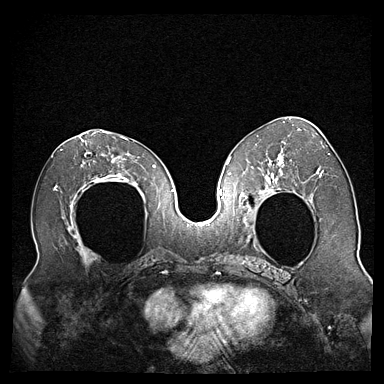
[im 108/144]
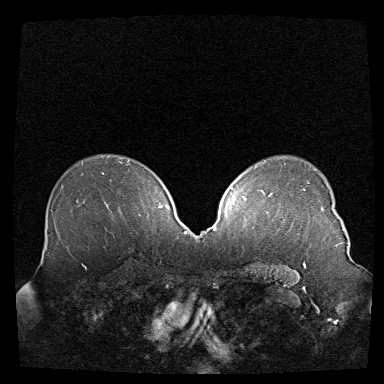
[im 144/144]
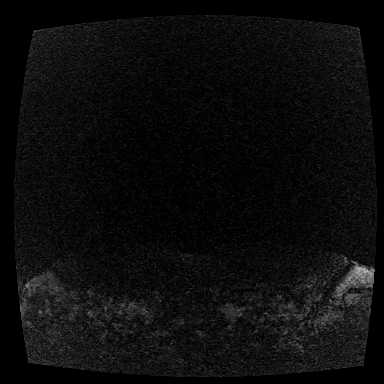

[Series 6: fl3d post-cm 20 · axial · 1.2mm · 1.04mm/px · z∈[-82,+89]mm · 5 of 144 slices shown (2 of 3)]
[im 1/144]
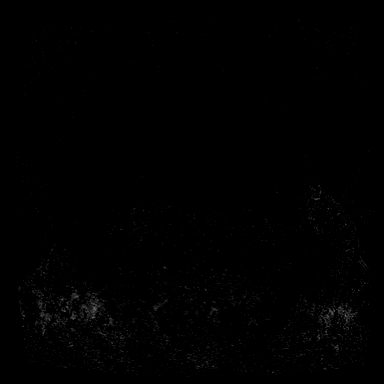
[im 36/144]
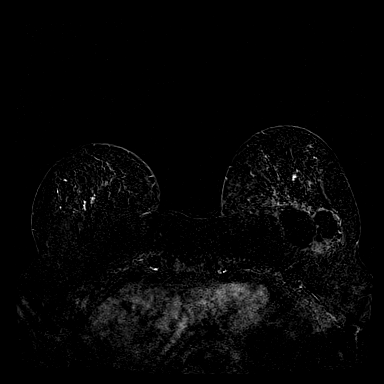
[im 72/144]
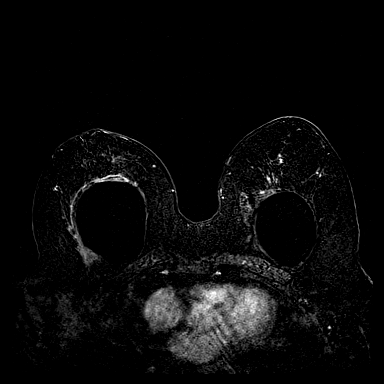
[im 108/144]
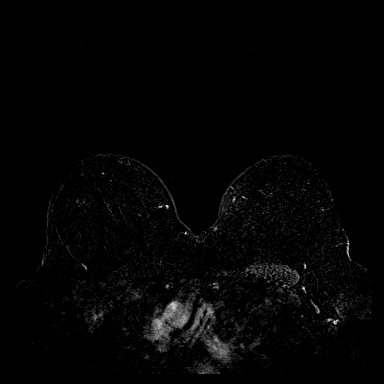
[im 144/144]
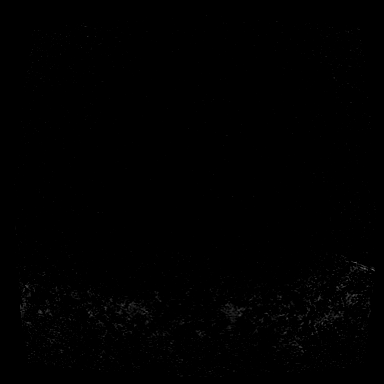

[Series 7: fl3d post-cm 20 · axial · 172.8mm · 1.04mm/px · 1 of 1 slices shown (3 of 3)]
[im 1/1]
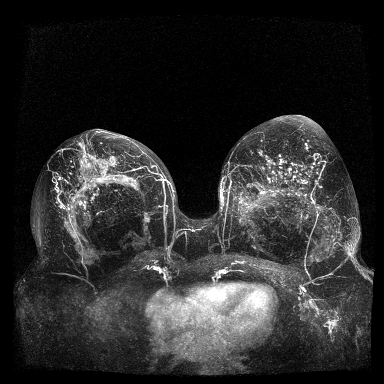

[Series 8: fl3d post-cm 3min · axial · 1.2mm · 1.04mm/px · z∈[-82,+89]mm · 6 of 144 slices shown]
[im 1/144]
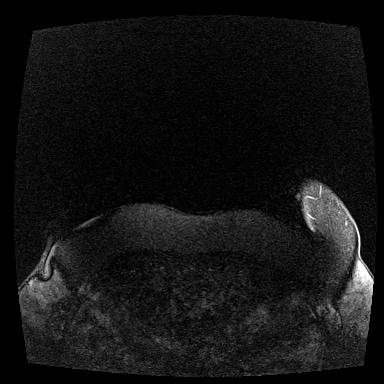
[im 29/144]
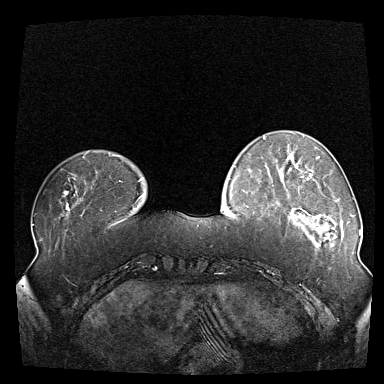
[im 58/144]
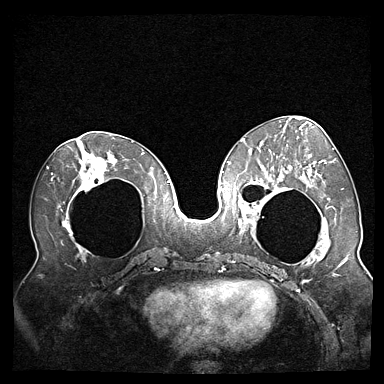
[im 86/144]
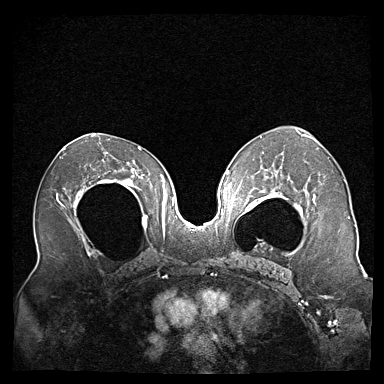
[im 115/144]
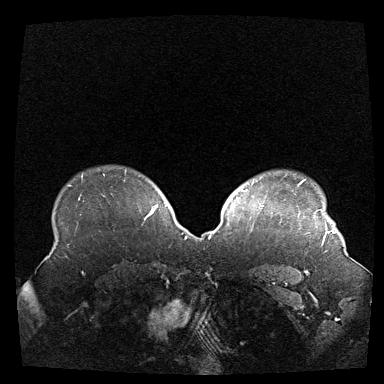
[im 144/144]
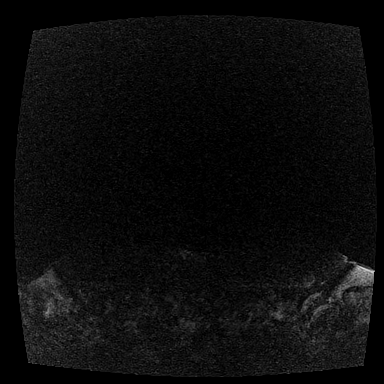

[Series 9: fl3d post-cm 3min_sub · axial · 1.2mm · 1.04mm/px · z∈[-82,+20]mm · 4 of 144 slices shown]
[im 1/144]
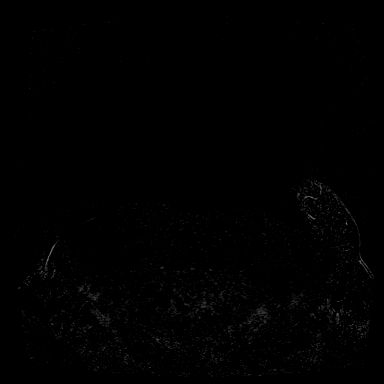
[im 29/144]
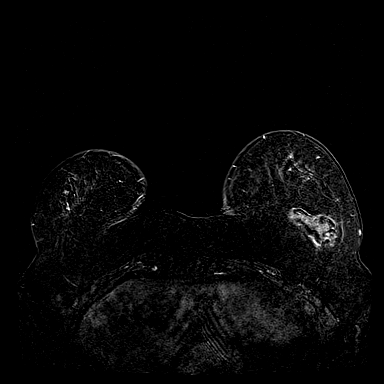
[im 58/144]
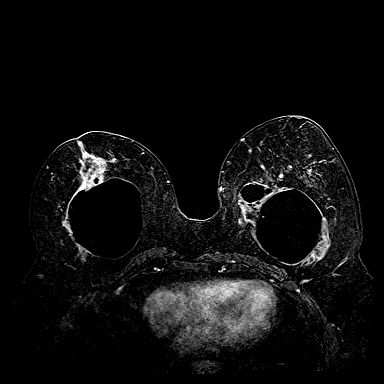
[im 86/144]
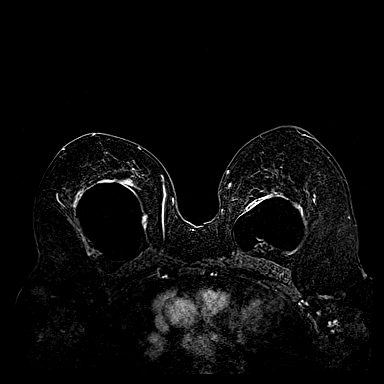

[32 of 48 positions shown; findings below may reference images not displayed]

Three-dimensional MR images were rendered by post-processing of the
original MR data on an independent workstation. The
three-dimensional MR images were interpreted, and findings are
reported in the following complete MRI report for this study. Three
dimensional images were evaluated at the independent interpreting
workstation using the DynaCAD thin client.
FINDINGS: Breast composition: b. Scattered fibroglandular tissue.

Background parenchymal enhancement: Moderate

Right breast: There is susceptibility artifact in the retroareolar
aspect of the right breast at the site of recent benign biopsy.
There is a retroglandular silicone implant with intra and
extracapsular rupture. There is non mass enhancement extending
anteriorly from the right breast implant to the nipple measuring
approximately 4.8 x 3.7 x 1.3 cm (series 9, image 86). The biopsy a
site is located just superior to this area of non mass enhancement.
There is a small amount of free silicone within the non-mass
enhancement though out of proportion compared to the size of the non
mass enhancement. There is no significant fat signal within the
non-mass enhancement to suggest fat necrosis.

Left breast: There is a retroglandular implant. There is both intra
and extracapsular silicone implant rupture with multiple lobules of
free silicone. No suspicious mass or abnormal enhancement.

Lymph nodes: No abnormal appearing lymph nodes.

Ancillary findings:  None.
IMPRESSION: 1. Indeterminate non-mass enhancement extending anteriorly from the
right breast implant to the nipple measuring approximately 4.8 x
x 1.3 cm. The clip from recent benign biopsy is located at the
superior edge of the non mass enhancement.
2. Bilateral extracapsular silicone implant rupture.
3. No MRI evidence of malignancy in the left breast.

RECOMMENDATION:
MRI guided core needle biopsy of the non mass enhancement in the
retroareolar right breast.

BI-RADS CATEGORY  4: Suspicious.

## 2021-02-26 MED ORDER — GADOBUTROL 1 MMOL/ML IV SOLN
9.0000 mL | Freq: Once | INTRAVENOUS | Status: AC | PRN
  Administered 2021-02-26: 9 mL via INTRAVENOUS

## 2021-03-07 ENCOUNTER — Ambulatory Visit (INDEPENDENT_AMBULATORY_CARE_PROVIDER_SITE_OTHER): Payer: Medicare Other

## 2021-03-07 DIAGNOSIS — J309 Allergic rhinitis, unspecified: Secondary | ICD-10-CM

## 2021-03-13 ENCOUNTER — Telehealth: Payer: Self-pay | Admitting: Neurology

## 2021-03-13 DIAGNOSIS — G4733 Obstructive sleep apnea (adult) (pediatric): Secondary | ICD-10-CM

## 2021-03-13 NOTE — Telephone Encounter (Signed)
I have placed order for the PAP supplies to [facility]. Pt has requested a change from [facility] to this DME.   I have sent message and advised pt to call me back in 5-7 days if she has not heard back about her supply refill.

## 2021-03-13 NOTE — Telephone Encounter (Signed)
Pt called wanting to know if a new prescription can be sent in to there DME so that she can get her supplies. She states it still has not been a year since she was last in the office so she is not understanding why they will not send her her cpap supplies. Please advise.

## 2021-03-14 ENCOUNTER — Other Ambulatory Visit: Payer: Medicare Other

## 2021-03-22 NOTE — Telephone Encounter (Signed)
I called pt and advised I have sent message to [facility] asking them to help with this . Order was sent on 03/13/21.

## 2021-03-22 NOTE — Telephone Encounter (Signed)
Pt called, I still have not received my CPAP supplies. Would like a call from the nurse.

## 2021-03-23 ENCOUNTER — Ambulatory Visit (INDEPENDENT_AMBULATORY_CARE_PROVIDER_SITE_OTHER): Payer: Medicare Other

## 2021-03-23 DIAGNOSIS — J309 Allergic rhinitis, unspecified: Secondary | ICD-10-CM

## 2021-03-28 ENCOUNTER — Ambulatory Visit (INDEPENDENT_AMBULATORY_CARE_PROVIDER_SITE_OTHER): Payer: Medicare Other

## 2021-03-28 DIAGNOSIS — J309 Allergic rhinitis, unspecified: Secondary | ICD-10-CM | POA: Diagnosis not present

## 2021-04-04 ENCOUNTER — Other Ambulatory Visit: Payer: Self-pay

## 2021-04-04 ENCOUNTER — Encounter: Payer: Self-pay | Admitting: Obstetrics & Gynecology

## 2021-04-04 ENCOUNTER — Ambulatory Visit (INDEPENDENT_AMBULATORY_CARE_PROVIDER_SITE_OTHER): Payer: Medicare Other | Admitting: Obstetrics & Gynecology

## 2021-04-04 ENCOUNTER — Other Ambulatory Visit (HOSPITAL_COMMUNITY)
Admission: RE | Admit: 2021-04-04 | Discharge: 2021-04-04 | Disposition: A | Discharge status: Home / Self Care | Payer: Medicare Other | Source: Ambulatory Visit | Attending: Obstetrics & Gynecology | Admitting: Obstetrics & Gynecology

## 2021-04-04 VITALS — BP 122/70 | Ht 67.25 in | Wt 206.0 lb

## 2021-04-04 DIAGNOSIS — Z780 Asymptomatic menopausal state: Secondary | ICD-10-CM

## 2021-04-04 DIAGNOSIS — Z01419 Encounter for gynecological examination (general) (routine) without abnormal findings: Secondary | ICD-10-CM | POA: Insufficient documentation

## 2021-04-04 DIAGNOSIS — Z9189 Other specified personal risk factors, not elsewhere classified: Secondary | ICD-10-CM

## 2021-04-04 DIAGNOSIS — E669 Obesity, unspecified: Secondary | ICD-10-CM

## 2021-04-04 NOTE — Progress Notes (Signed)
Brooke Farrell [birth year] [id number]   History:    65 y.o. G3P1A2L1 Married.  Son has 2 children  RP:  Established patient presenting for Annual Gynecologic exam  HPI: Postmenopausal, well on no hormone replacement therapy.  No postmenopausal bleeding.  No pelvic pain.  Abstinent.  Left breast normal.  Right breast under investigation with a benign Bx 02/2021.  Urine and bowel movements normal.  Body mass index 32.02.  Left hip replacement done 01/2020.  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  Health labs with family physician. [facility] 2018.  Past medical history,surgical history, family history and social history were all reviewed and documented in the EPIC chart.  Gynecologic History No LMP recorded. Patient is postmenopausal.  Obstetric History OB History  Gravida Para Term Preterm AB Living  3 1     2 1   SAB IAB Ectopic Multiple Live Births               # Outcome Date GA Lbr Len/2nd Weight Sex Delivery Anes PTL Lv  3 AB           2 AB           1 Para              ROS: A ROS was performed and pertinent positives and negatives are included in the history.  GENERAL: No fevers or chills. HEENT: No change in vision, no earache, sore throat or sinus congestion. NECK: No pain or stiffness. CARDIOVASCULAR: No chest pain or pressure. No palpitations. PULMONARY: No shortness of breath, cough or wheeze. GASTROINTESTINAL: No abdominal pain, nausea, vomiting or diarrhea, melena or bright red blood per rectum. GENITOURINARY: No urinary frequency, urgency, hesitancy or dysuria. MUSCULOSKELETAL: No joint or muscle pain, no back pain, no recent trauma. DERMATOLOGIC: No rash, no itching, no lesions. ENDOCRINE: No polyuria, polydipsia, no heat or cold intolerance. No recent change in weight. HEMATOLOGICAL: No anemia or easy bruising or bleeding. NEUROLOGIC: No headache, seizures, numbness, tingling or weakness. PSYCHIATRIC: No depression, no loss of interest in normal activity or change in sleep  pattern.     Exam:   BP 122/70   Ht 5' 7.25" (1.708 m)   Wt 206 lb (93.4 kg)   BMI 32.02 kg/m   Body mass index is 32.02 kg/m.  General appearance : Well developed well nourished female. No acute distress HEENT: Eyes: no retinal hemorrhage or exudates,  Neck supple, trachea midline, no carotid bruits, no thyroidmegaly Lungs: Clear to auscultation, no rhonchi or wheezes, or rib retractions  Heart: Regular rate and rhythm, no murmurs or gallops Breast:Examined in sitting and supine position were symmetrical in appearance, no palpable masses or tenderness,  no skin retraction, no nipple inversion, no nipple discharge, no skin discoloration, no axillary or supraclavicular lymphadenopathy Abdomen: no palpable masses or tenderness, no rebound or guarding Extremities: no edema or skin discoloration or tenderness  Pelvic: Vulva: Normal             Vagina: No gross lesions or discharge  Cervix: No gross lesions or discharge.  Pap reflex done.  Uterus  AV, normal size, shape and consistency, non-tender and mobile  Adnexa  Without masses or tenderness  Anus: Normal   Assessment/Plan:  65 y.o. female for annual exam   1. Encounter for routine gynecological examination with Papanicolaou smear of cervix Normal gynecologic exam in menopause.  Pap reflex done.  Breast exam normal.  Mammography February 2022 left breast negative.  Right breast under  investigation with a benign biopsy April 2022.  Colonoscopy 2018.  Health labs with family physician. - Cytology - PAP( [facility])  2. Postmenopause Well on no hormone replacement therapy.  No postmenopausal bleeding.  Bone density to organize.  3. Obesity (BMI 30.0-34.9) Recommend a lower calorie/carb diet.  Continue physical activity associated with farming.  Genia Del MD, 10:12 AM 04/04/2021

## 2021-04-05 LAB — CYTOLOGY - PAP: Diagnosis: NEGATIVE

## 2021-04-06 ENCOUNTER — Ambulatory Visit (INDEPENDENT_AMBULATORY_CARE_PROVIDER_SITE_OTHER): Payer: Medicare Other

## 2021-04-06 DIAGNOSIS — J309 Allergic rhinitis, unspecified: Secondary | ICD-10-CM

## 2021-04-10 ENCOUNTER — Encounter: Payer: Self-pay | Admitting: Obstetrics & Gynecology

## 2021-04-11 ENCOUNTER — Ambulatory Visit (INDEPENDENT_AMBULATORY_CARE_PROVIDER_SITE_OTHER): Payer: Medicare Other | Admitting: Adult Health

## 2021-04-11 ENCOUNTER — Encounter: Payer: Self-pay | Admitting: Adult Health

## 2021-04-11 VITALS — BP 136/96 | HR 79 | Ht 67.0 in | Wt 205.0 lb

## 2021-04-11 DIAGNOSIS — G4733 Obstructive sleep apnea (adult) (pediatric): Secondary | ICD-10-CM

## 2021-04-11 DIAGNOSIS — Z9989 Dependence on other enabling machines and devices: Secondary | ICD-10-CM

## 2021-04-11 NOTE — Patient Instructions (Signed)
Continue using CPAP nightly and greater than 4 hours each night °If your symptoms worsen or you develop new symptoms please let us know.  ° °

## 2021-04-11 NOTE — Progress Notes (Addendum)
PATIENT: Brooke Farrell DOB: 08-09-1956  REASON FOR VISIT: follow up HISTORY FROM: patient PRIMARY NEUROLOGIST:  Dr. Frances Furbish  HISTORY OF PRESENT ILLNESS: Today 04/11/21:  Brooke Farrell is a 65 year old female with a history of obstructive sleep apnea on CPAP.  She returns today for follow-up.  She states that everything is going well with the CPAP.  Her only issue is that she needs new supplies and has not been able to get this from her DME company.    HISTORY (copied from Dr. Teofilo Pod note)  04/05/2020: I reviewed her CPAP compliance data from 03/05/2020 through 04/03/2020, which is a total of 30 days, during which time she used her machine every night with percent use days greater than 4 hours at 97%, indicating excellent compliance with an average usage of 7 hours and 55 minutes, residual AHI at goal at 3.6/h, leak on the higher side with a 95th percentile at 19.5 L/min, pressure of 10 cm with EPR of 3.  She reports feeling stable and doing well with her CPAP, indicates full compliance and ongoing good results.  She is up-to-date with her supplies and uses nasal pillows.  Although the pressure is currently at 10 cm, it seems adequate at this time.   The patient's allergies, current medications, family history, past medical history, past social history, past surgical history and problem list were reviewed and updated as appropriate.  REVIEW OF SYSTEMS: Out of a complete 14 system review of symptoms, the patient complains only of the following symptoms, and all other reviewed systems are negative.   ESS 5  ALLERGIES: Allergies  Allergen Reactions   Prednisone Other (See Comments)    Patient reports having very bad mood swings and was very angry when on this drug   Robitussin (Alcohol Free)  [Guaifenesin] Swelling   Sulfa Antibiotics Swelling   Corticosteroids    Amoxicillin Diarrhea    HOME MEDICATIONS: Outpatient Medications Prior to Visit  Medication Sig Dispense Refill    albuterol (VENTOLIN HFA) 108 (90 Base) MCG/ACT inhaler INHALE 2 PUFFS INTO THE LUNGS EVERY 4 (FOUR) HOURS AS NEEDED FOR WHEEZING OR SHORTNESS OF BREATH. 6.7 each 1   azelastine (ASTELIN) 0.1 % nasal spray Place 1 spray into both nostrils daily. Use in each nostril as directed 90 mL 1   cetirizine (ZYRTEC) 10 MG tablet Take 10 mg by mouth daily as needed.      Dexlansoprazole 30 MG capsule Take 30 mg by mouth daily as needed.     Eluxadoline 75 MG TABS Take 75 mg by mouth daily.     montelukast (SINGULAIR) 10 MG tablet Take 1 tablet (10 mg total) by mouth daily as needed. 90 tablet 1   tiZANidine (ZANAFLEX) 4 MG tablet Take 1 tablet (4 mg total) by mouth every 6 (six) hours as needed for muscle spasms. 30 tablet 0   traZODone (DESYREL) 50 MG tablet      zolpidem (AMBIEN) 10 MG tablet Take 10 mg by mouth at bedtime as needed.     No facility-administered medications prior to visit.    PAST MEDICAL HISTORY: Past Medical History:  Diagnosis Date   Acid reflux    Anxiety    Asthma     PAST SURGICAL HISTORY: Past Surgical History:  Procedure Laterality Date   AUGMENTATION MAMMAPLASTY  1985   NO PAST SURGERIES     TOTAL HIP ARTHROPLASTY  01/2020   dr. Despina Hick    FAMILY HISTORY: Family History  Problem Relation Age  of Onset   Breast cancer Mother    Cancer Father        bone   Diabetes Father    Hypertension Father    Heart attack Father    Allergic rhinitis Neg Hx    Angioedema Neg Hx    Asthma Neg Hx    Atopy Neg Hx    Eczema Neg Hx    Immunodeficiency Neg Hx    Urticaria Neg Hx     SOCIAL HISTORY: Social History   Socioeconomic History   Marital status: Married    Spouse name: Not on file   Number of children: Not on file   Years of education: Not on file   Highest education level: Not on file  Occupational History   Not on file  Tobacco Use   Smoking status: Never Smoker   Smokeless tobacco: Never Used  Vaping Use   Vaping Use: Never used  Substance and  Sexual Activity   Alcohol use: Yes    Alcohol/week: 0.0 standard drinks    Comment: RARE   Drug use: No   Sexual activity: Not Currently    Partners: Male    Birth control/protection: None    Comment: 1st intercourse- 16, partners- 6, married- 25 yrs  Other Topics Concern   Not on file  Social History Narrative   Not on file   Social Determinants of Health   Financial Resource Strain: Not on file  Food Insecurity: Not on file  Transportation Needs: Not on file  Physical Activity: Not on file  Stress: Not on file  Social Connections: Not on file  Intimate Partner Violence: Not on file      PHYSICAL EXAM  Vitals:   04/11/21 1012  BP: (!) 136/96  Pulse: 79  Weight: 205 lb (93 kg)  Height: 5\' 7"  (1.702 m)   Body mass index is 32.11 kg/m.  Generalized: Well developed, in no acute distress  Chest: Lungs clear to auscultation bilaterally  Neurological examination  Mentation: Alert oriented to time, place, history taking. Follows all commands speech and language fluent Cranial nerve II-XII: Extraocular movements were full, visual field were full on confrontational test Head turning and shoulder shrug  were normal and symmetric. Motor: The motor testing reveals 5 over 5 strength of all 4 extremities. Good symmetric motor tone is noted throughout.  Sensory: Sensory testing is intact to soft touch on all 4 extremities. No evidence of extinction is noted.  Gait and station: Gait is normal.    DIAGNOSTIC DATA (LABS, IMAGING, TESTING) - I reviewed patient records, labs, notes, testing and imaging myself where available.     ASSESSMENT AND PLAN 65 y.o. year old female  has a past medical history of Acid reflux, Anxiety, and Asthma. here with:  OSA on CPAP  - CPAP compliance excellent - Good treatment of AHI  - Encourage patient to use CPAP nightly and > 4 hours each night - F/U in 1 year or sooner if needed   76, MSN, NP-C 04/11/2021, 10:48 AM [facility] [address] [place], [place] [phone]  I reviewed the above note and documentation by the Nurse Practitioner and agree with the history, exam, assessment and plan as outlined above. I was available for consultation. [phone], MD, PhD [facility])

## 2021-04-12 ENCOUNTER — Ambulatory Visit
Admission: RE | Admit: 2021-04-12 | Discharge: 2021-04-12 | Disposition: A | Discharge status: Home / Self Care | Payer: Medicare Other | Source: Ambulatory Visit | Attending: Radiology | Admitting: Radiology

## 2021-04-12 ENCOUNTER — Other Ambulatory Visit: Payer: Self-pay

## 2021-04-12 DIAGNOSIS — N6310 Unspecified lump in the right breast, unspecified quadrant: Secondary | ICD-10-CM

## 2021-04-12 IMAGING — MR MR BREAST BILAT WO/W CM
5 of 9 series · 24 of 48 positions shown · IV contrast (gadavist)
Comparison: None.

CLINICAL DATA: 64-year-old female presenting for diagnostic MRI.
Patient had a recent biopsy of the right breast demonstrating fat
necrosis. Known bilateral implant rupture.

LABS:  None.
EXAM:
BILATERAL BREAST MRI WITH AND WITHOUT CONTRAST
TECHNIQUE: Multiplanar, multisequence MR images of both breasts were obtained
prior to and following the intravenous administration of 9 ml of
Gadavist

[Series 2: STIR · axial · 4.0mm · 0.74mm/px · z∈[-71,+105]mm · 4 of 45 slices shown (1 of 3)]
[im 1/45]
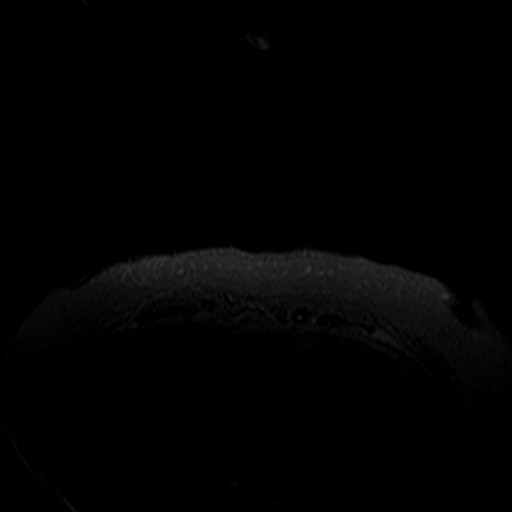
[im 15/45]
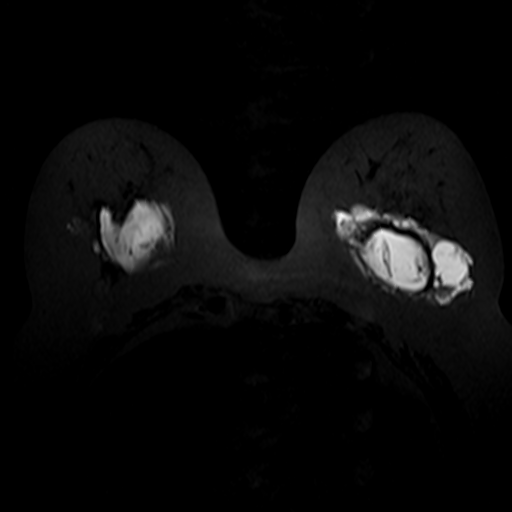
[im 30/45]
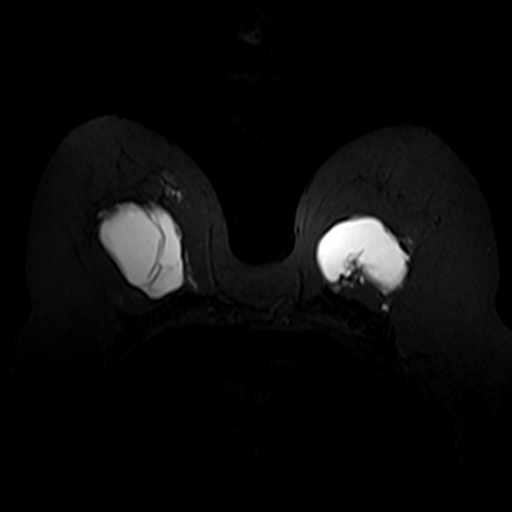
[im 45/45]
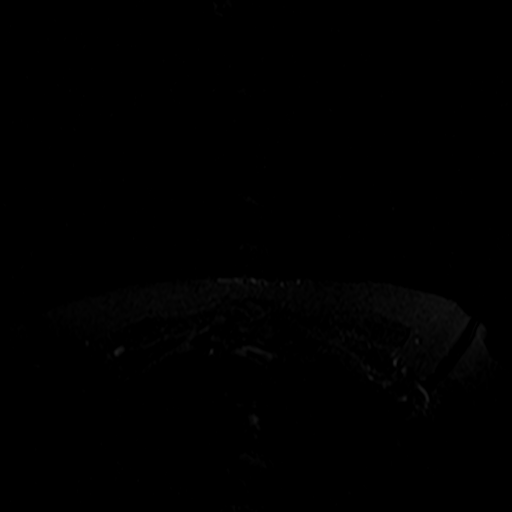

[Series 3: T2 fat-sat · axial · 4.0mm · 0.85mm/px · z∈[-63,+97]mm · 3 of 41 slices shown]
[im 1/41]
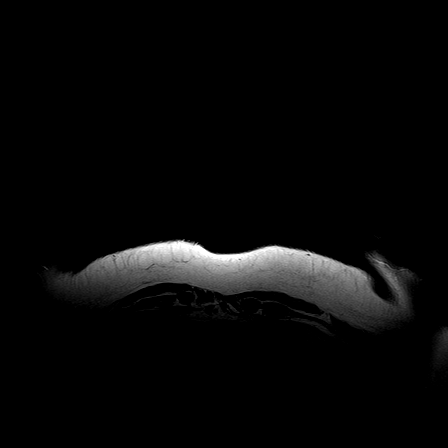
[im 21/41]
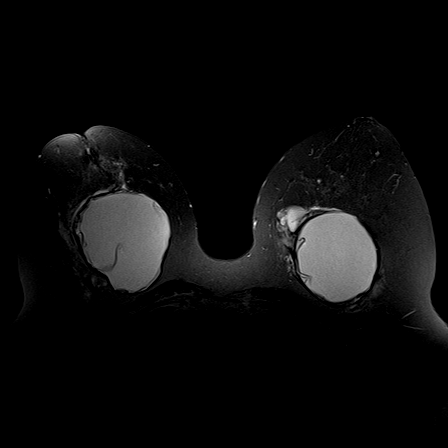
[im 41/41]
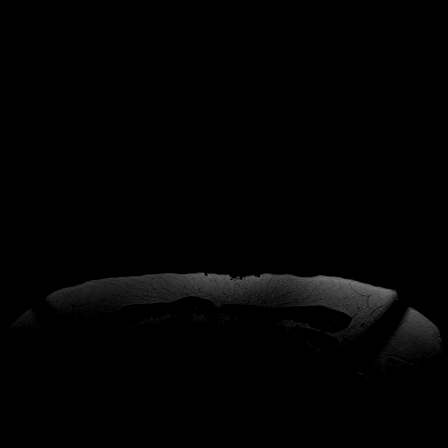

[Series 4: T1 fat-sat · axial · 1.2mm · 0.99mm/px · z∈[-67,+105]mm · 11 of 144 slices shown]
[im 1/144]
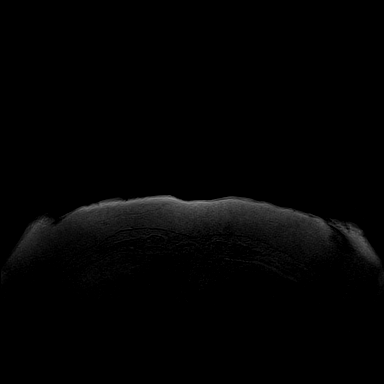
[im 15/144]
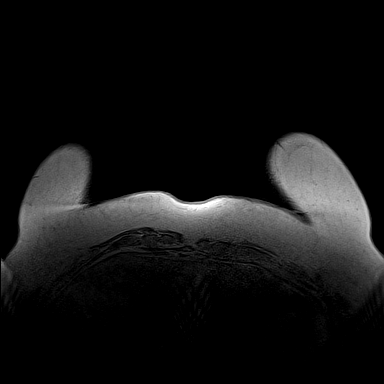
[im 29/144]
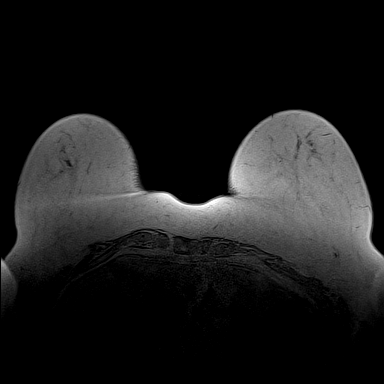
[im 43/144]
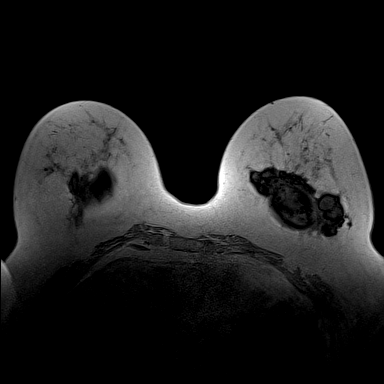
[im 58/144]
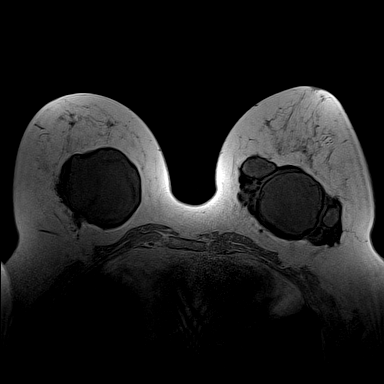
[im 72/144]
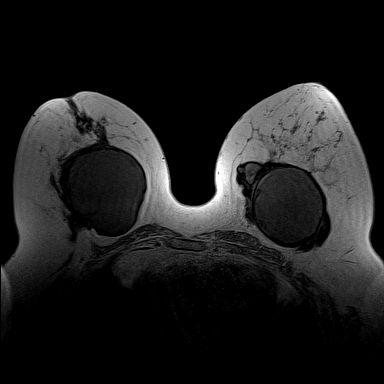
[im 86/144]
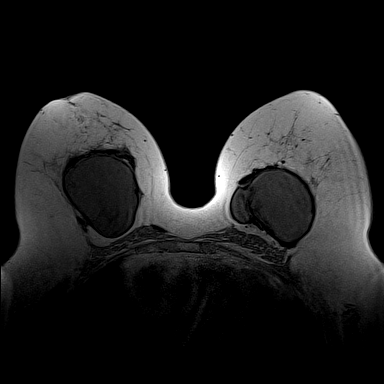
[im 101/144]
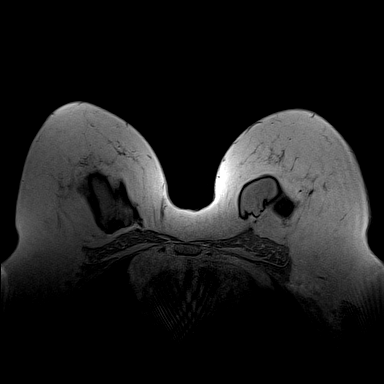
[im 115/144]
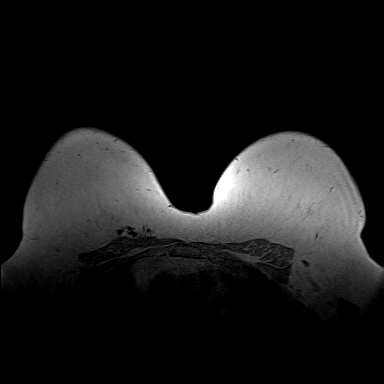
[im 129/144]
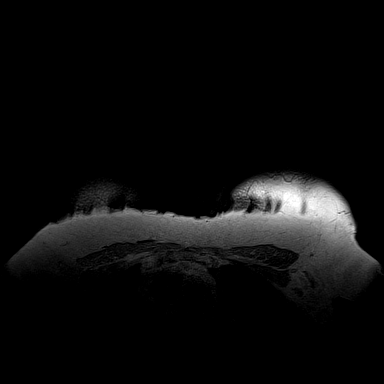
[im 144/144]
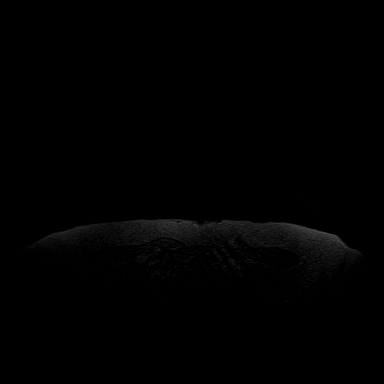

[Series 5: STIR · sagittal · 4.0mm · 0.47mm/px · 3 of 38 slices shown (2 of 3)]
[im 1/38]
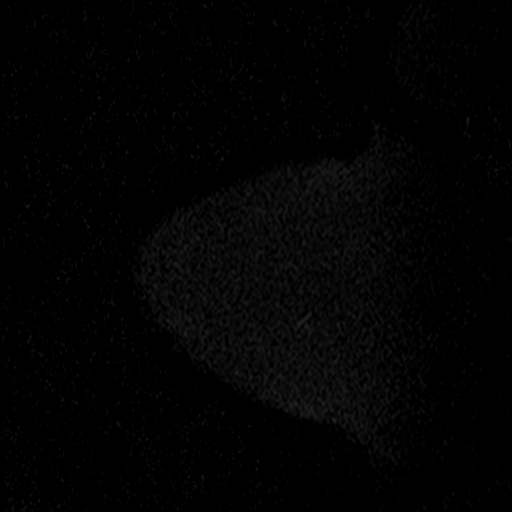
[im 19/38]
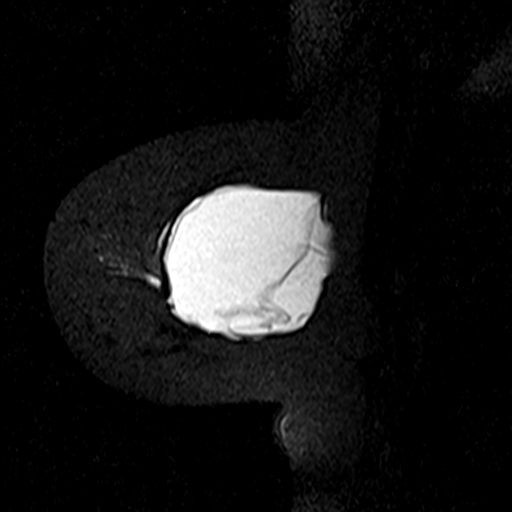
[im 38/38]
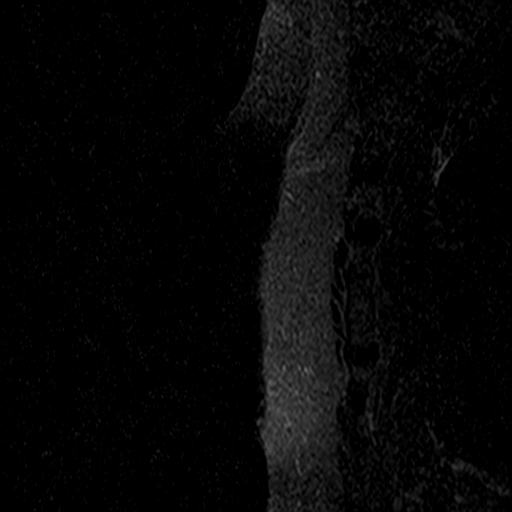

[Series 6: STIR · sagittal · 4.0mm · 0.47mm/px · 3 of 38 slices shown (3 of 3)]
[im 1/38]
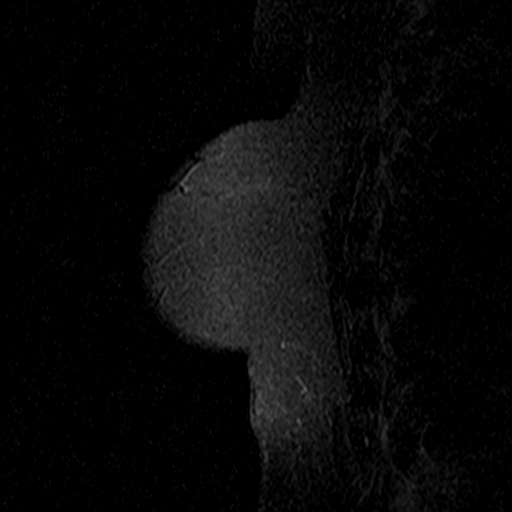
[im 19/38]
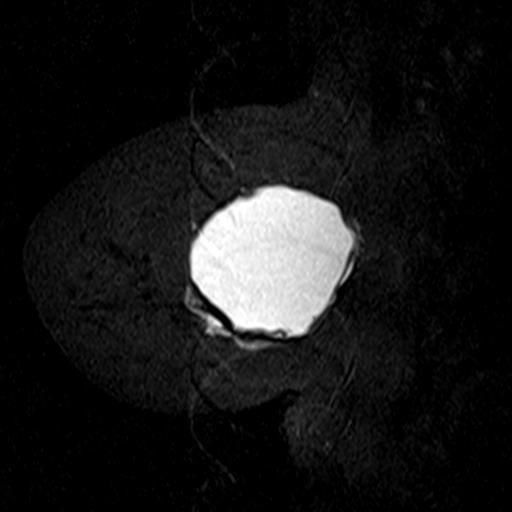
[im 38/38]
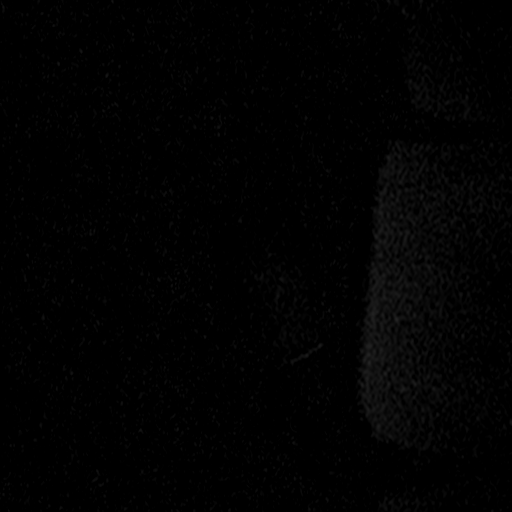

[24 of 48 positions shown; findings below may reference images not displayed]

Three-dimensional MR images were rendered by post-processing of the
original MR data on an independent workstation. The
three-dimensional MR images were interpreted, and findings are
reported in the following complete MRI report for this study. Three
dimensional images were evaluated at the independent interpreting
workstation using the DynaCAD thin client.
FINDINGS: Breast composition: b. Scattered fibroglandular tissue.

Background parenchymal enhancement: Moderate

Right breast: There is susceptibility artifact in the retroareolar
aspect of the right breast at the site of recent benign biopsy.
There is a retroglandular silicone implant with intra and
extracapsular rupture. There is non mass enhancement extending
anteriorly from the right breast implant to the nipple measuring
approximately 4.8 x 3.7 x 1.3 cm (series 9, image 86). The biopsy a
site is located just superior to this area of non mass enhancement.
There is a small amount of free silicone within the non-mass
enhancement though out of proportion compared to the size of the non
mass enhancement. There is no significant fat signal within the
non-mass enhancement to suggest fat necrosis.

Left breast: There is a retroglandular implant. There is both intra
and extracapsular silicone implant rupture with multiple lobules of
free silicone. No suspicious mass or abnormal enhancement.

Lymph nodes: No abnormal appearing lymph nodes.

Ancillary findings:  None.
IMPRESSION: 1. Indeterminate non-mass enhancement extending anteriorly from the
right breast implant to the nipple measuring approximately 4.8 x
x 1.3 cm. The clip from recent benign biopsy is located at the
superior edge of the non mass enhancement.
2. Bilateral extracapsular silicone implant rupture.
3. No MRI evidence of malignancy in the left breast.

RECOMMENDATION:
MRI guided core needle biopsy of the non mass enhancement in the
retroareolar right breast.

BI-RADS CATEGORY  4: Suspicious.

## 2021-04-13 ENCOUNTER — Ambulatory Visit (INDEPENDENT_AMBULATORY_CARE_PROVIDER_SITE_OTHER): Payer: Medicare Other

## 2021-04-13 DIAGNOSIS — J309 Allergic rhinitis, unspecified: Secondary | ICD-10-CM

## 2021-04-17 ENCOUNTER — Telehealth: Payer: BC Managed Care – PPO | Admitting: Adult Health

## 2021-04-18 ENCOUNTER — Other Ambulatory Visit: Payer: Self-pay | Admitting: Radiology

## 2021-04-18 DIAGNOSIS — R9389 Abnormal findings on diagnostic imaging of other specified body structures: Secondary | ICD-10-CM

## 2021-04-23 ENCOUNTER — Ambulatory Visit
Admission: RE | Admit: 2021-04-23 | Discharge: 2021-04-23 | Disposition: A | Discharge status: Home / Self Care | Payer: Medicare Other | Source: Ambulatory Visit | Attending: Radiology | Admitting: Radiology

## 2021-04-23 ENCOUNTER — Other Ambulatory Visit: Payer: Self-pay

## 2021-04-23 DIAGNOSIS — R9389 Abnormal findings on diagnostic imaging of other specified body structures: Secondary | ICD-10-CM

## 2021-04-23 IMAGING — MG MM BREAST LOCALIZATION CLIP
4 series · 4 of 12 positions shown · non-contrast
Comparison: Previous exam(s).

CLINICAL DATA: Status post MR guided core biopsy of the right
breast.

EXAM:
DIAGNOSTIC RIGHT MAMMOGRAM POST MRI BIOPSY

[R CC synth-2D]
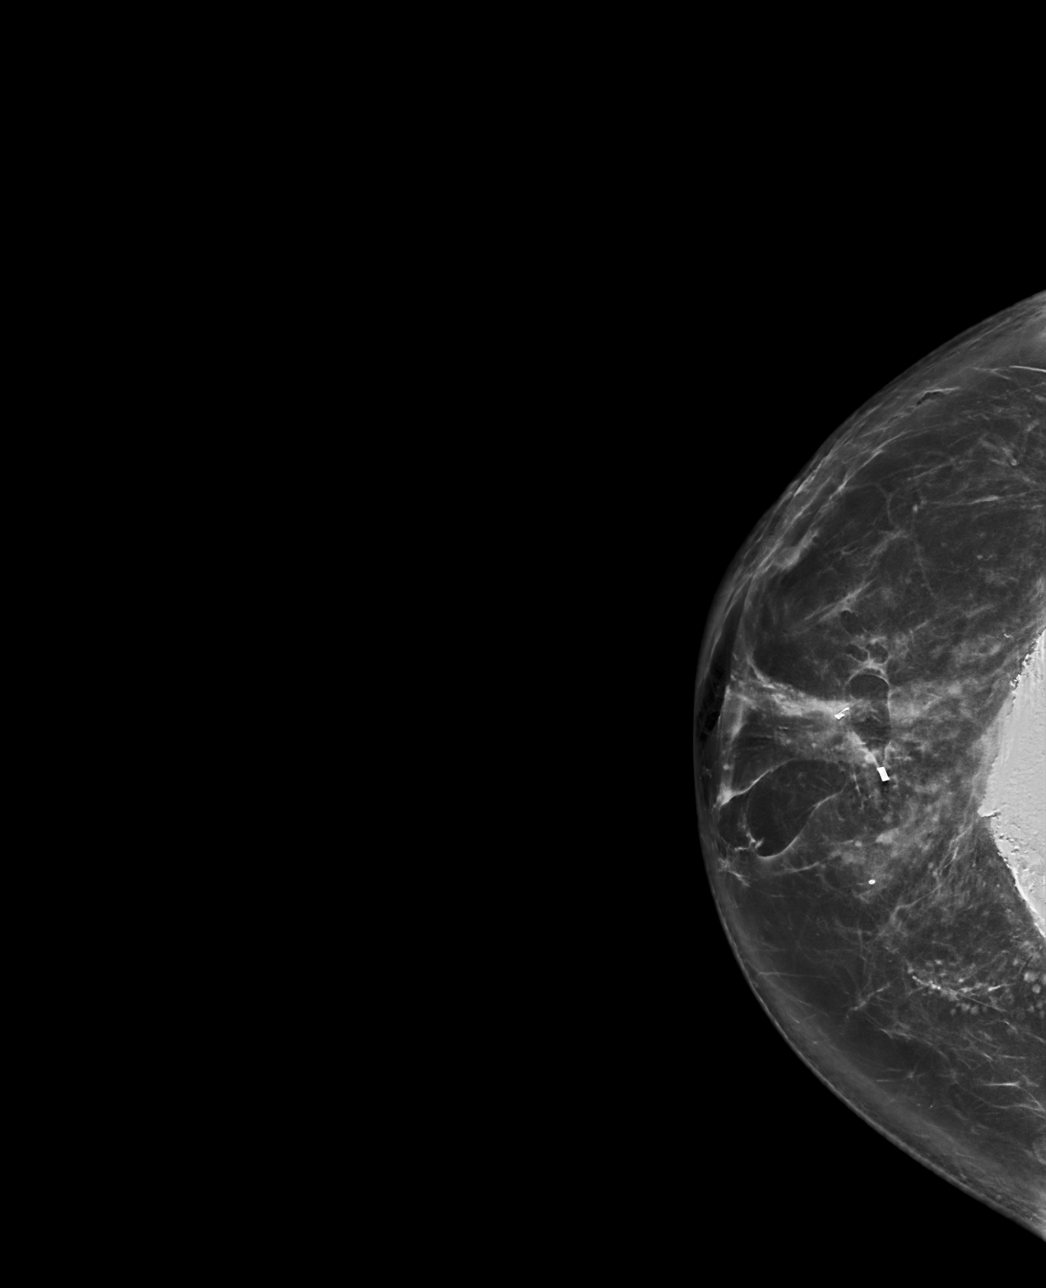

[R ML synth-2D]
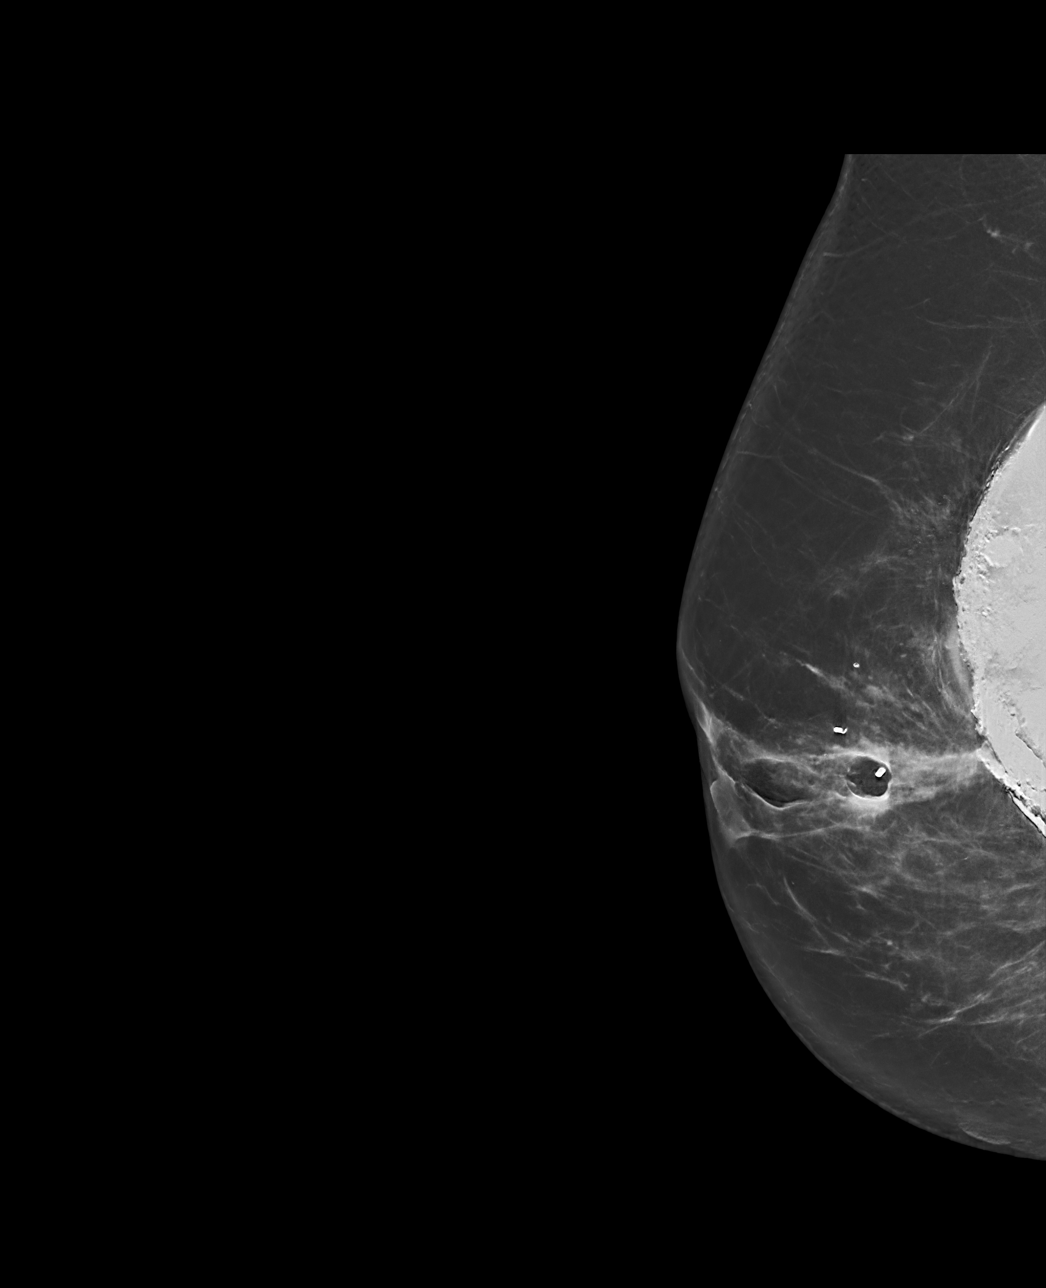

[R CC tomo · tomo slice 46/91.0]
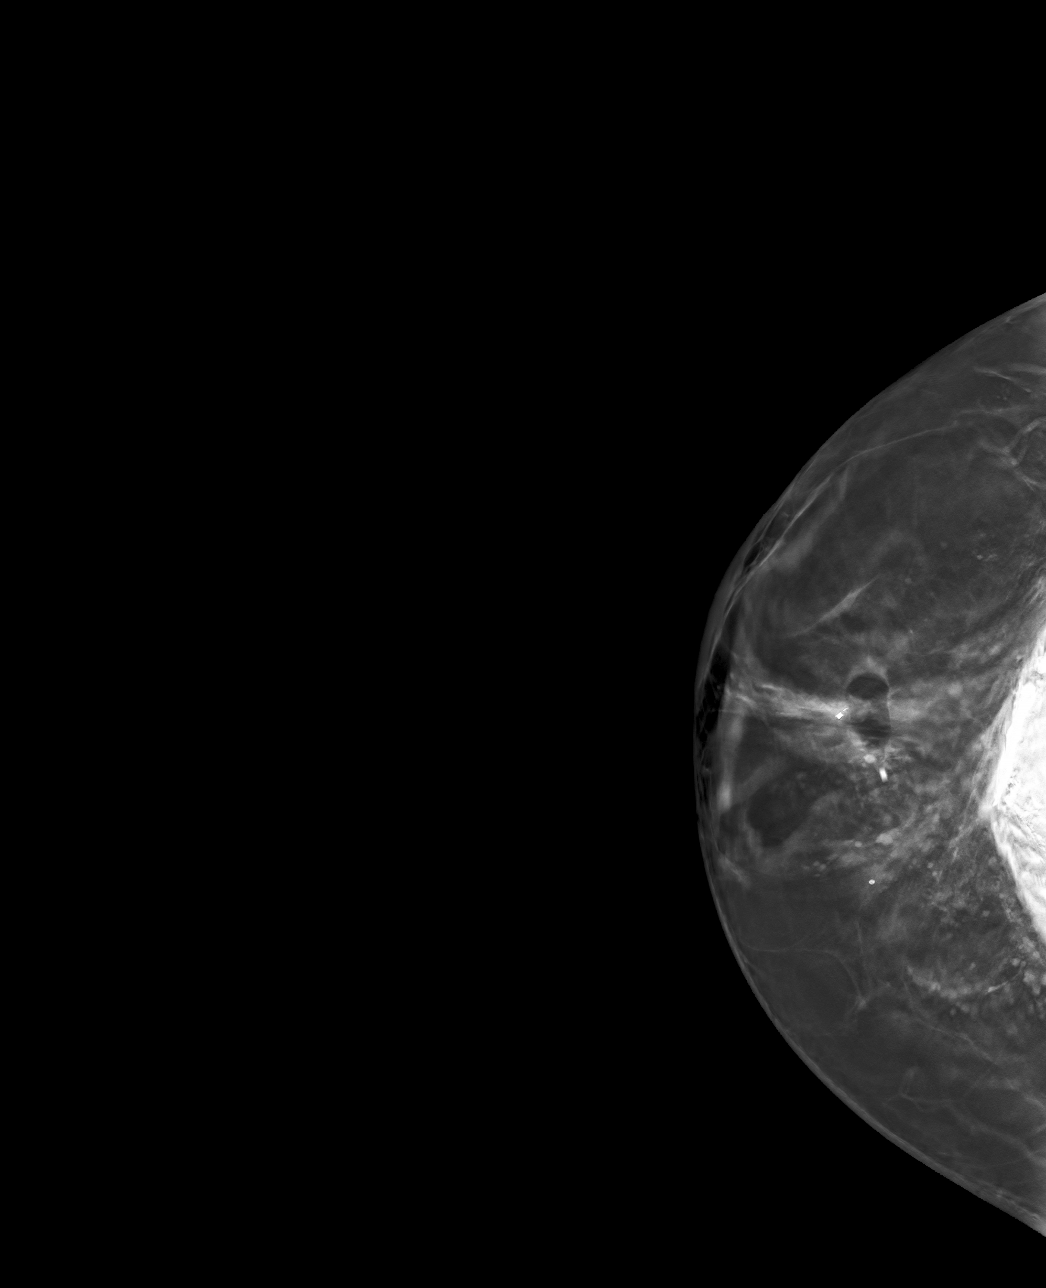

[R ML tomo · tomo slice 47/92.0]
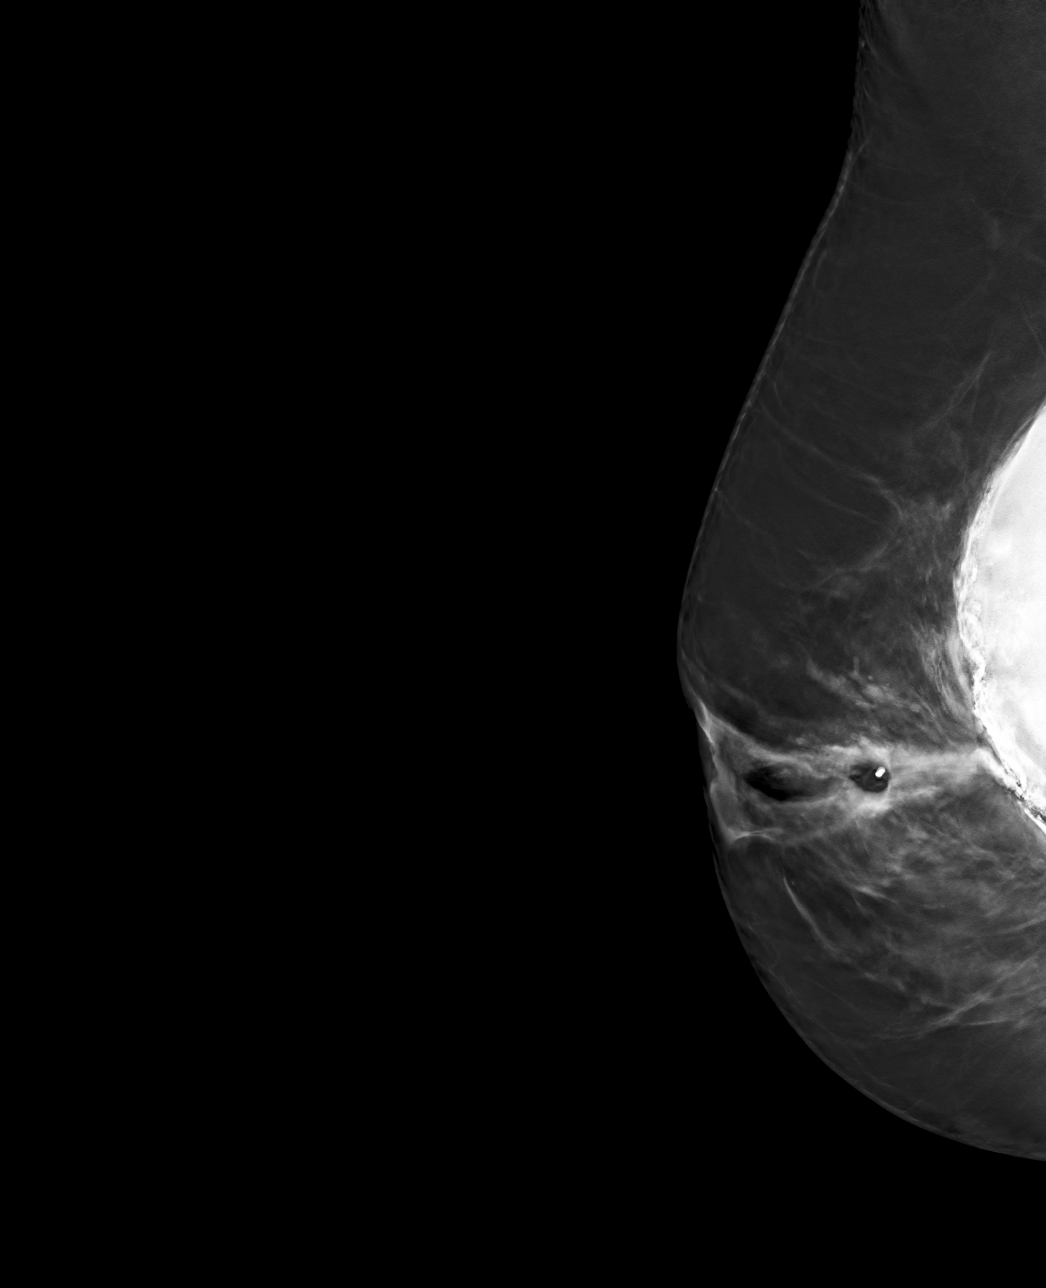

[4 of 12 positions shown; findings below may reference images not displayed]

FINDINGS: Mammographic images were obtained following MRI guided biopsy of the
right breast. The biopsy marking clip is in expected position at the
site of biopsy.
IMPRESSION: Appropriate positioning of the cylindrical shaped biopsy marking
clip at the site of biopsy in the retroareolar region of the right
breast.

Final Assessment: Post Procedure Mammograms for Marker Placement

## 2021-04-23 IMAGING — MR MR BREAST BX W/ LOC DEV 1ST LEASION IMAGE BX SPEC MR GUIDE*R*
10 of 13 series · 34 of 48 positions shown · IV contrast (10 ml Gadavist)
Comparison: Previous exams.
COMPARISON: Previous exams.

Addendum:
CLINICAL DATA: Non masslike enhancement in the retroareolar region
of the right breast. MR guided core biopsy recommended.

EXAM:
MRI GUIDED CORE NEEDLE BIOPSY OF THE RIGHT BREAST
TECHNIQUE: Multiplanar, multisequence MR imaging of the right breast was
performed both before and after administration of intravenous
contrast.
CONTRAST:  10mL GADAVIST GADOBUTROL 1 MMOL/ML IV SOLN

[Series 2: fiducial unilateral · sagittal · 2.0mm · 1.33mm/px · 2 of 72 slices shown]
[im 1/72]
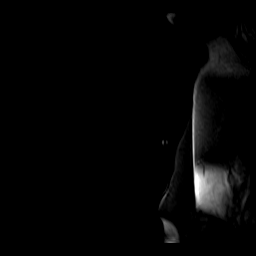
[im 72/72]
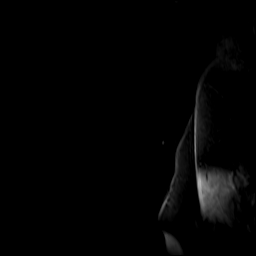

[Series 3: dynamic pre · axial · non-contrast · 1.3mm · 0.73mm/px · z∈[-127,+121]mm · 4 of 192 slices shown]
[im 1/192]
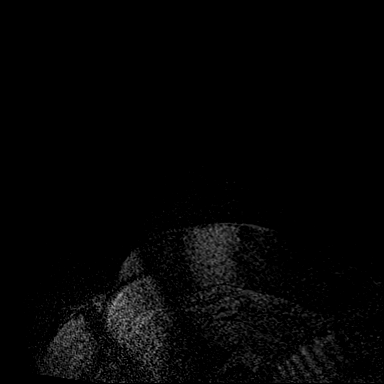
[im 64/192]
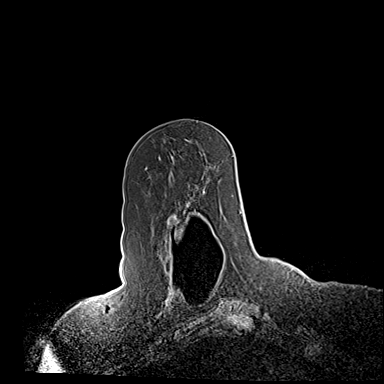
[im 128/192]
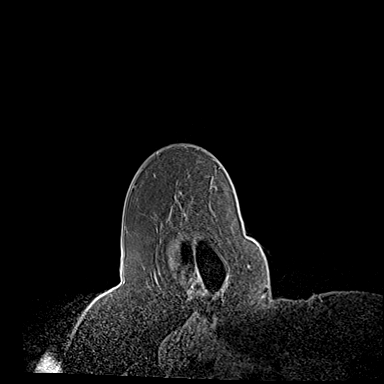
[im 192/192]
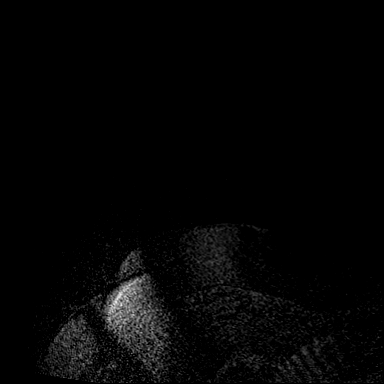

[Series 4: dynamic post 20 · axial · 1.3mm · 0.73mm/px · z∈[-127,+121]mm · 4 of 192 slices shown (1 of 2)]
[im 1/192]
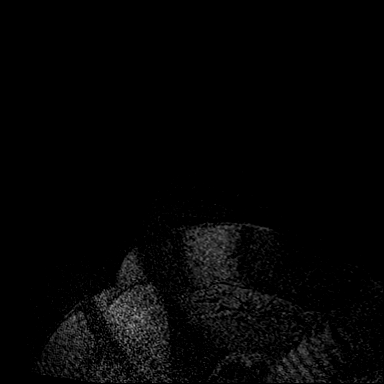
[im 64/192]
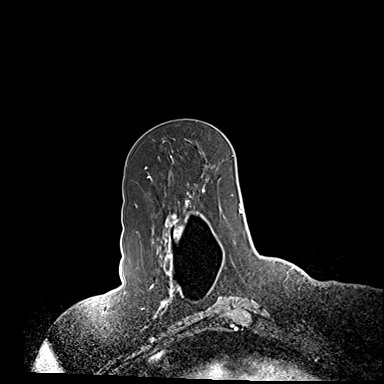
[im 128/192]
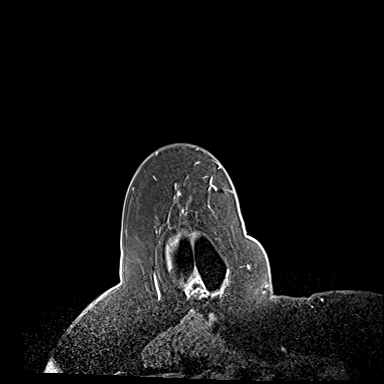
[im 192/192]
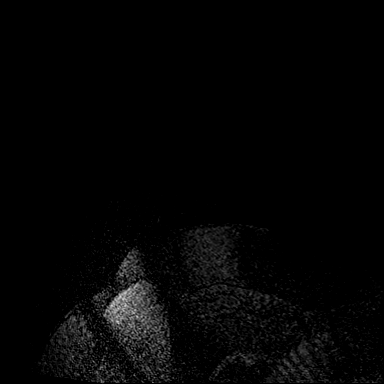

[Series 5: dynamic post 20 · axial · 1.3mm · 0.73mm/px · z∈[-127,+121]mm · 4 of 192 slices shown (2 of 2)]
[im 1/192]
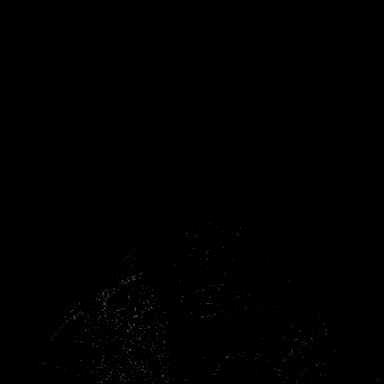
[im 64/192]
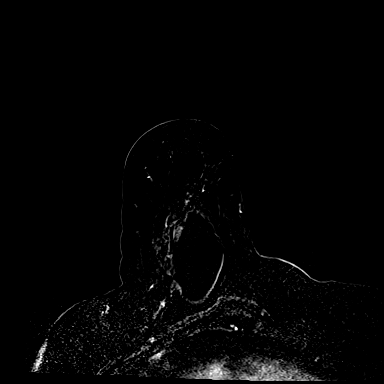
[im 128/192]
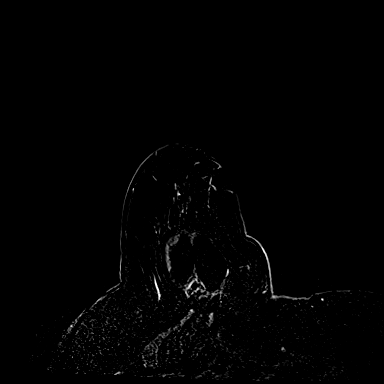
[im 192/192]
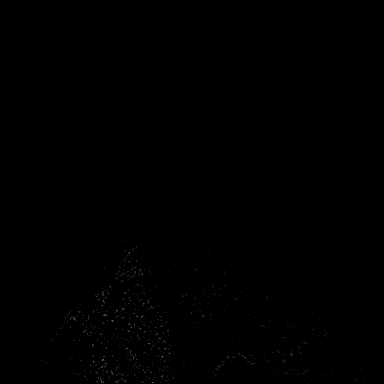

[Series 6: dynamic post 3 · axial · 1.3mm · 0.73mm/px · z∈[-127,+121]mm · 4 of 192 slices shown (1 of 2)]
[im 1/192]
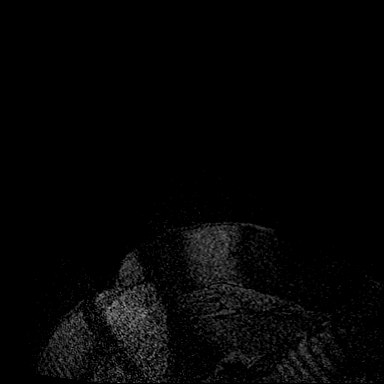
[im 64/192]
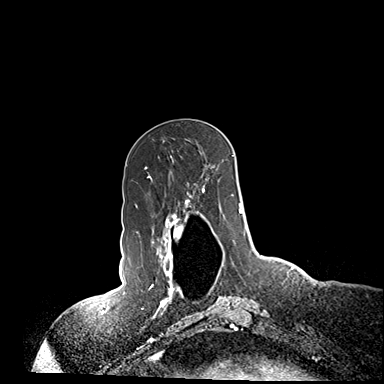
[im 128/192]
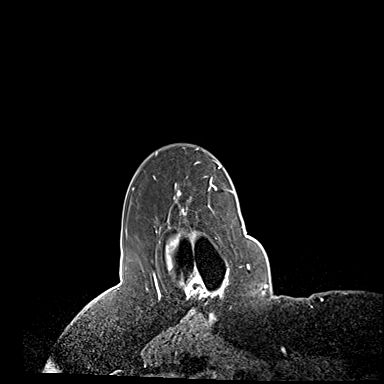
[im 192/192]
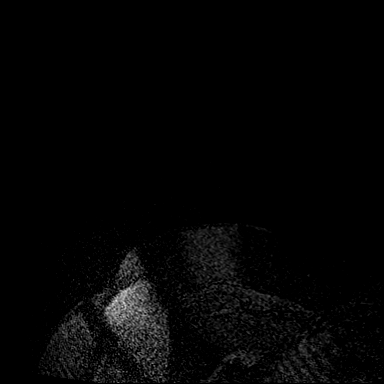

[Series 7: dynamic post 3 · axial · 1.3mm · 0.73mm/px · z∈[-127,+121]mm · 4 of 192 slices shown (2 of 2)]
[im 1/192]
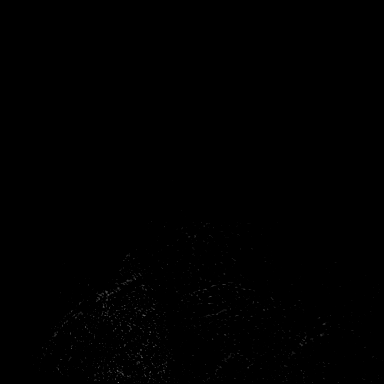
[im 64/192]
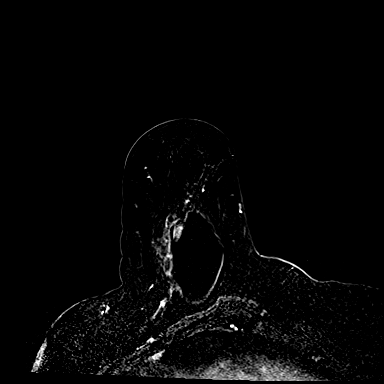
[im 128/192]
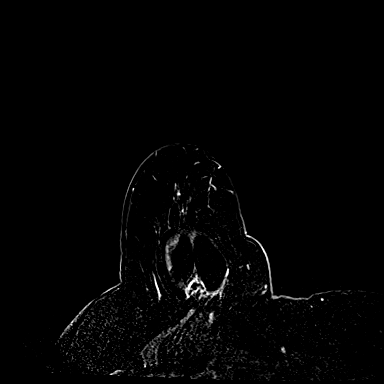
[im 192/192]
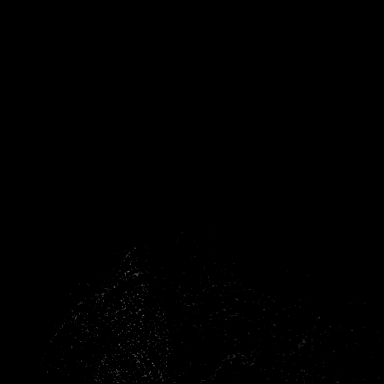

[Series 8: fiducial unilateral repeat · sagittal · 2.0mm · 1.33mm/px · 2 of 72 slices shown]
[im 1/72]
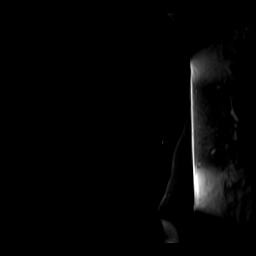
[im 72/72]
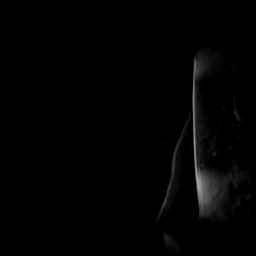

[Series 9: dynamic post 15 · axial · 1.3mm · 0.73mm/px · z∈[-127,+121]mm · 4 of 192 slices shown (1 of 2)]
[im 1/192]
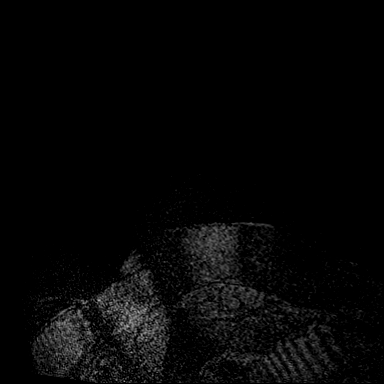
[im 64/192]
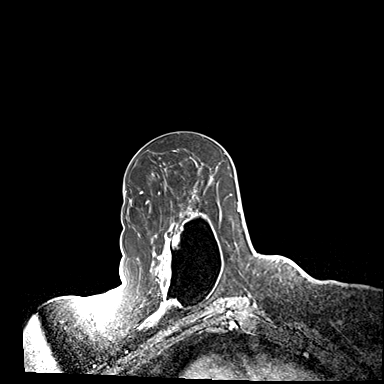
[im 128/192]
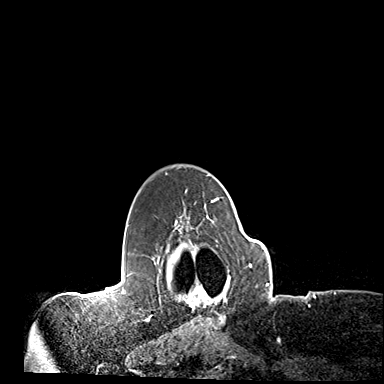
[im 192/192]
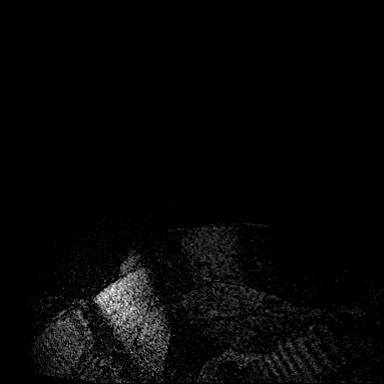

[Series 10: dynamic post 15 · axial · 1.3mm · 0.73mm/px · z∈[-127,+121]mm · 4 of 192 slices shown (2 of 2)]
[im 1/192]
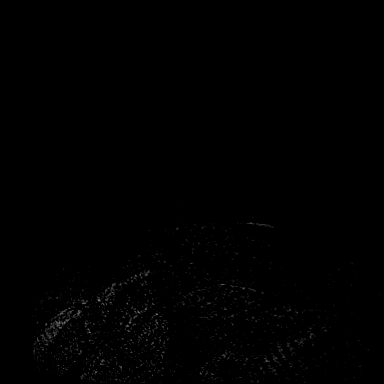
[im 64/192]
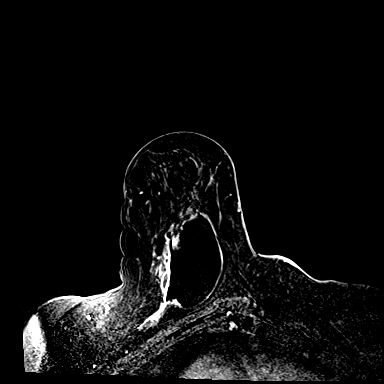
[im 128/192]
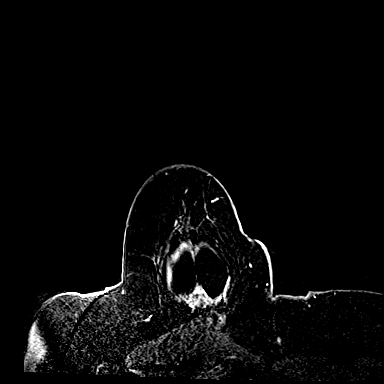
[im 192/192]
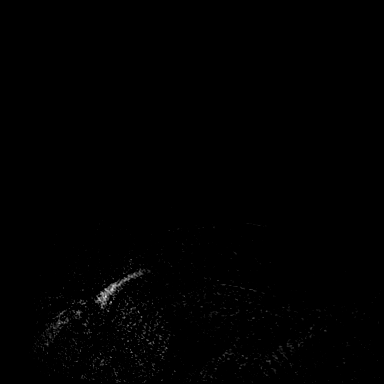

[Series 11: needle confirmation · axial · 1.3mm · 0.73mm/px · z∈[-127,-45]mm · 2 of 192 slices shown]
[im 1/192]
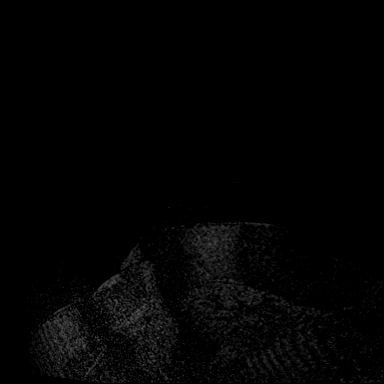
[im 64/192]
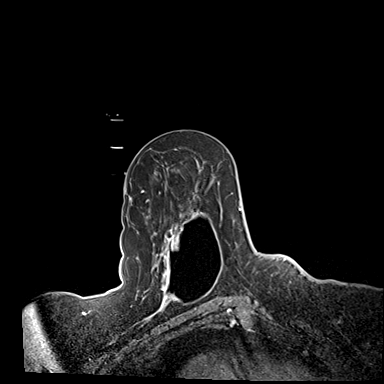

[34 of 48 positions shown; findings below may reference images not displayed]

FINDINGS: I met with the patient, and we discussed the procedure of MRI guided
biopsy, including risks, benefits, and alternatives. Specifically,
we discussed the risks of infection, bleeding, tissue injury, clip
migration, and inadequate sampling. Informed, written consent was
given. The usual time out protocol was performed immediately prior
to the procedure.

Using sterile technique, 1% Lidocaine, MRI guidance, and a 9 gauge
vacuum assisted device, biopsy was performed of the right breast
using a lateral to medial approach. At the conclusion of the
procedure, a cylindrical shaped tissue marker clip was deployed into
the biopsy cavity. Follow-up 2-view mammogram was performed and
dictated separately.
IMPRESSION: MRI guided biopsy of the right breast.  No apparent complications.

ADDENDUM:
Pathology revealed FAT NECROSIS WITH CALCIFICATIONS- NO MALIGNANCY
IDENTIFIED of the RIGHT breast, retroareolar, cylindrical clip. This
was found to be concordant by Dr. TEMPLE.

Pathology results were discussed with the patient by telephone. The
patient reported doing well after the biopsy with tenderness at the
site. Post biopsy instructions and care were reviewed and questions
were answered. The patient was encouraged to call The [REDACTED]

Bilateral breast MRI recommended in 6 months per protocol.

Pathology results reported by TEMPLE RN on [DATE].

*** End of Addendum ***
FINDINGS: I met with the patient, and we discussed the procedure of MRI guided
biopsy, including risks, benefits, and alternatives. Specifically,
we discussed the risks of infection, bleeding, tissue injury, clip
migration, and inadequate sampling. Informed, written consent was
given. The usual time out protocol was performed immediately prior
to the procedure.

Using sterile technique, 1% Lidocaine, MRI guidance, and a 9 gauge
vacuum assisted device, biopsy was performed of the right breast
using a lateral to medial approach. At the conclusion of the
procedure, a cylindrical shaped tissue marker clip was deployed into
the biopsy cavity. Follow-up 2-view mammogram was performed and
dictated separately.
IMPRESSION: MRI guided biopsy of the right breast.  No apparent complications.

## 2021-04-23 MED ORDER — GADOBUTROL 1 MMOL/ML IV SOLN
10.0000 mL | Freq: Once | INTRAVENOUS | Status: AC | PRN
  Administered 2021-04-23: 10 mL via INTRAVENOUS

## 2021-04-25 ENCOUNTER — Ambulatory Visit (INDEPENDENT_AMBULATORY_CARE_PROVIDER_SITE_OTHER): Payer: Medicare Other

## 2021-04-25 DIAGNOSIS — J309 Allergic rhinitis, unspecified: Secondary | ICD-10-CM

## 2021-05-09 ENCOUNTER — Ambulatory Visit (INDEPENDENT_AMBULATORY_CARE_PROVIDER_SITE_OTHER): Payer: Medicare Other

## 2021-05-09 DIAGNOSIS — J309 Allergic rhinitis, unspecified: Secondary | ICD-10-CM | POA: Diagnosis not present

## 2021-05-16 ENCOUNTER — Telehealth: Payer: Self-pay | Admitting: Adult Health

## 2021-05-16 NOTE — Telephone Encounter (Signed)
Secure message sent to [employer] staff. Order was written on 04/11/21.

## 2021-05-16 NOTE — Telephone Encounter (Signed)
I called the patient.  She is stating that [employer] told her they needed a signed order. I have it printed and placed on Megan's desk for signature tomorrow morning.

## 2021-05-16 NOTE — Telephone Encounter (Signed)
Pt has called stating that [employer] [facility] informed her that they are waiting on a work order from [facility] so they can ship pt her CPAP supplies, pt is asking that the needed work order please be sent to [employer] [facility] so she is able to get her CPAP supplies.

## 2021-05-17 NOTE — Telephone Encounter (Signed)
Cpap order signed by MM NP and faxed to [employer]. Received a receipt of confirmation.

## 2021-05-25 ENCOUNTER — Ambulatory Visit (INDEPENDENT_AMBULATORY_CARE_PROVIDER_SITE_OTHER): Payer: Medicare Other

## 2021-05-25 DIAGNOSIS — J309 Allergic rhinitis, unspecified: Secondary | ICD-10-CM

## 2021-05-28 ENCOUNTER — Ambulatory Visit: Payer: Medicare Other | Admitting: Podiatry

## 2021-05-29 ENCOUNTER — Ambulatory Visit: Payer: Medicare Other | Admitting: Podiatry

## 2021-05-30 DIAGNOSIS — J3081 Allergic rhinitis due to animal (cat) (dog) hair and dander: Secondary | ICD-10-CM | POA: Diagnosis not present

## 2021-05-30 NOTE — Progress Notes (Signed)
VIALS MADE. EXP 05-30-22 

## 2021-05-31 DIAGNOSIS — J3089 Other allergic rhinitis: Secondary | ICD-10-CM | POA: Diagnosis not present

## 2021-06-06 ENCOUNTER — Ambulatory Visit (INDEPENDENT_AMBULATORY_CARE_PROVIDER_SITE_OTHER): Payer: Medicare Other

## 2021-06-06 DIAGNOSIS — J309 Allergic rhinitis, unspecified: Secondary | ICD-10-CM | POA: Diagnosis not present

## 2021-06-20 ENCOUNTER — Ambulatory Visit (INDEPENDENT_AMBULATORY_CARE_PROVIDER_SITE_OTHER): Payer: Medicare Other

## 2021-06-20 DIAGNOSIS — J309 Allergic rhinitis, unspecified: Secondary | ICD-10-CM

## 2021-07-04 ENCOUNTER — Ambulatory Visit (INDEPENDENT_AMBULATORY_CARE_PROVIDER_SITE_OTHER): Payer: Medicare Other

## 2021-07-04 DIAGNOSIS — J309 Allergic rhinitis, unspecified: Secondary | ICD-10-CM | POA: Diagnosis not present

## 2021-07-18 ENCOUNTER — Ambulatory Visit: Payer: Medicare Other | Admitting: Allergy & Immunology

## 2021-07-25 ENCOUNTER — Ambulatory Visit (INDEPENDENT_AMBULATORY_CARE_PROVIDER_SITE_OTHER): Payer: Medicare Other

## 2021-07-25 DIAGNOSIS — J309 Allergic rhinitis, unspecified: Secondary | ICD-10-CM

## 2021-08-01 ENCOUNTER — Ambulatory Visit (INDEPENDENT_AMBULATORY_CARE_PROVIDER_SITE_OTHER): Payer: Medicare Other

## 2021-08-01 DIAGNOSIS — J309 Allergic rhinitis, unspecified: Secondary | ICD-10-CM

## 2021-08-03 ENCOUNTER — Ambulatory Visit: Payer: Medicare Other | Admitting: Allergy & Immunology

## 2021-08-07 NOTE — Patient Instructions (Addendum)
1. Perennial and seasonal allergic rhinitis (dust mites, cat, dog, grasses, cockroach, trees, ragweed, indoor molds, and weeds) - Continue with allergy injections and have access to epinephrine auto-injector. When you get home call and let us know if your EpiPen is out of date - Continue with Zyrtec (cetirizine) 10 mg up to twice daily as needed for runny nose or itching. - Stop Zerviate eye drops due to dry eyes - Continue fluticasone 1-2 sprays each nostril once a day as needed for stuffy nose. In the right nostril, point the applicator out toward the right ear. In the left nostril, point the applicator out toward the left ear -Continue Singulair (montelukast) 10 mg once a day.   2. Mild intermittent asthma, uncomplicated  - Continue with albuterol four puffs every 4-6 hours as needed for coughing/wheezing.   3. Return in about 12 months or sooner if needed

## 2021-08-08 ENCOUNTER — Encounter: Payer: Self-pay | Admitting: Family

## 2021-08-08 ENCOUNTER — Other Ambulatory Visit: Payer: Self-pay

## 2021-08-08 ENCOUNTER — Ambulatory Visit (INDEPENDENT_AMBULATORY_CARE_PROVIDER_SITE_OTHER): Payer: Medicare Other | Admitting: Family

## 2021-08-08 VITALS — BP 128/80 | HR 80 | Temp 97.3°F | Resp 16 | Ht 68.75 in | Wt 200.0 lb

## 2021-08-08 DIAGNOSIS — J4520 Mild intermittent asthma, uncomplicated: Secondary | ICD-10-CM | POA: Diagnosis not present

## 2021-08-08 DIAGNOSIS — J309 Allergic rhinitis, unspecified: Secondary | ICD-10-CM

## 2021-08-08 DIAGNOSIS — J302 Other seasonal allergic rhinitis: Secondary | ICD-10-CM

## 2021-08-08 NOTE — Progress Notes (Signed)
[address] [place]  [place] [address] Dept: [phone]  FOLLOW UP NOTE  Patient ID: Brooke Farrell, female    DOB: 1955-11-06  Age: 65 y.o. MRN: [id number] Date of Office Visit: 08/08/2021  Assessment  Chief Complaint: Allergic Rhinitis  (Says they are doing ok. The immunotherapy is helping.)  HPI Brooke Farrell is a 65 year old female who presents today for follow-up seasonal and perennial allergic rhinitis and mild intermittent asthma.  She was last seen on July 14, 2020 by Dr. Dellis Anes.   Last week she received her Bivalent COVID-19 vaccine.  Since her last office visit she reports that she has had 4 clips placed in her right breast again to check for cancer.  She reports that there were was no cancer found though.  Seasonal and perennial allergic rhinitis is reported as controlled with Zyrtec 10 mg as needed, Singulair 10 mg once a day as needed, fluticasone nasal spray 1 spray each nostril once a day, and allergy injections per protocol.  She does feel like her allergy injections help and she denies any large local reactions at the injection site.  She does  think that her EpiPen is up-to-date, but will check when she gets home and will let us know.  She reports some rhinorrhea and nasal congestion last week.  She denies postnasal drip.  She has not had any sinus infections since we last saw her.  She has has not had any systemic steroids since the last time we saw her.  She reports that usually her allergies are worse in the spring and the fall, but the worst being in spring.  She reports that he has dry eyes and will uses So Smoothe eyedrops as recommended by her doctor.  Mild intermittent asthma is reported as controlled with albuterol as needed.  She denies any coughing, wheezing, tightness in her chest, shortness of breath, and nocturnal awakenings due to breathing problems.  Since her last office visit she has not required any systemic steroids or made any trips  to the emergency room or urgent care due to breathing problems.  She has not had to use her albuterol inhaler since early spring.  She also reports that she wears a CPAP at night and has been wearing one for 4 to 5 years.   Drug Allergies:  Allergies  Allergen Reactions   Prednisone Other (See Comments)    Patient reports having very bad mood swings and was very angry when on this drug   Robitussin (Alcohol Free)  [Guaifenesin] Swelling   Sulfa Antibiotics Swelling   Corticosteroids    Amoxicillin Diarrhea    Review of Systems: Review of Systems  Constitutional:  Negative for chills and fever.  HENT:         Reports some nasal congestion and rhinorrhea last week.  Denies postnasal drip  Eyes:        Reports that she has dry eyes for which she uses so smooth eyedrop. At times her eyes will feel gritty  Respiratory:  Negative for cough, shortness of breath and wheezing.   Cardiovascular:  Negative for chest pain and palpitations.  Gastrointestinal:        Reports heartburn at times.  She has a hiatal hernia for which she will take pantoprazole 40 mg maybe once every 2 weeks.  She does well as long as she watches her diet.  Genitourinary:  Negative for frequency.  Skin:  Negative for itching and rash.  Neurological:  Reports that she has been having some headaches that she thinks is due to broken sleep and having to get up and take her dog out to go to the bathroom throughout the night.  Endo/Heme/Allergies:  Positive for environmental allergies.    Physical Exam: BP 128/80   Pulse 80   Temp (!) 97.3 F (36.3 C) (Temporal)   Resp 16   Ht 5' 8.75" (1.746 m)   Wt 200 lb (90.7 kg)   SpO2 98%   BMI 29.75 kg/m    Physical Exam Constitutional:      Appearance: Normal appearance.  HENT:     Head: Normocephalic and atraumatic.     Comments: Pharynx: Mild cobblestoning noted, eyes normal, ears normal, nose: Bilateral lower turbinates moderately edematous and slightly  erythematous with no drainage noted    Right Ear: Tympanic membrane, ear canal and external ear normal.     Left Ear: Tympanic membrane, ear canal and external ear normal.     Mouth/Throat:     Mouth: Mucous membranes are moist.     Pharynx: Oropharynx is clear.  Eyes:     Conjunctiva/sclera: Conjunctivae normal.  Cardiovascular:     Rate and Rhythm: Regular rhythm.     Heart sounds: Normal heart sounds.  Pulmonary:     Effort: Pulmonary effort is normal.     Breath sounds: Normal breath sounds.     Comments: Lungs clear to auscultation Musculoskeletal:     Cervical back: Neck supple.  Skin:    General: Skin is warm.  Neurological:     Mental Status: She is alert and oriented to person, place, and time.  Psychiatric:        Mood and Affect: Mood normal.        Behavior: Behavior normal.        Thought Content: Thought content normal.        Judgment: Judgment normal.    Diagnostics: FVC 3.06 L, FEV1 2.52 L.  Predicted FVC 3.42 L, predicted FEV1 2.65 L.  Spirometry indicates normal respiratory function.  Assessment and Plan: 1. Mild intermittent asthma, uncomplicated   2. Seasonal and perennial allergic rhinitis     No orders of the defined types were placed in this encounter.   Patient Instructions  1. Perennial and seasonal allergic rhinitis (dust mites, cat, dog, grasses, cockroach, trees, ragweed, indoor molds, and weeds) - Continue with allergy injections and have access to epinephrine auto-injector. When you get home call and let us know if your EpiPen is out of date - Continue with Zyrtec (cetirizine) 10 mg up to twice daily as needed for runny nose or itching. - Stop Zerviate eye drops due to dry eyes - Continue fluticasone 1-2 sprays each nostril once a day as needed for stuffy nose. In the right nostril, point the applicator out toward the right ear. In the left nostril, point the applicator out toward the left ear   2. Mild intermittent asthma, uncomplicated   - Continue with albuterol four puffs every 4-6 hours as needed for coughing/wheezing.   3. Return in about 12 months or sooner if needed   Return in about 1 year (around 08/08/2022), or if symptoms worsen or fail to improve.    Thank you for the opportunity to care for this patient.  Please do not hesitate to contact me with questions.  Nehemiah Settle, FNP [facility]

## 2021-08-22 ENCOUNTER — Ambulatory Visit (INDEPENDENT_AMBULATORY_CARE_PROVIDER_SITE_OTHER): Payer: Medicare Other

## 2021-08-22 DIAGNOSIS — J309 Allergic rhinitis, unspecified: Secondary | ICD-10-CM | POA: Diagnosis not present

## 2021-08-29 ENCOUNTER — Ambulatory Visit (INDEPENDENT_AMBULATORY_CARE_PROVIDER_SITE_OTHER): Payer: Medicare Other

## 2021-08-29 DIAGNOSIS — J309 Allergic rhinitis, unspecified: Secondary | ICD-10-CM | POA: Diagnosis not present

## 2021-09-05 ENCOUNTER — Ambulatory Visit (INDEPENDENT_AMBULATORY_CARE_PROVIDER_SITE_OTHER): Payer: Medicare Other

## 2021-09-05 DIAGNOSIS — J309 Allergic rhinitis, unspecified: Secondary | ICD-10-CM | POA: Diagnosis not present

## 2021-09-11 ENCOUNTER — Other Ambulatory Visit: Payer: Self-pay

## 2021-09-11 ENCOUNTER — Ambulatory Visit (INDEPENDENT_AMBULATORY_CARE_PROVIDER_SITE_OTHER): Payer: Medicare Other | Admitting: Podiatry

## 2021-09-11 ENCOUNTER — Encounter: Payer: Self-pay | Admitting: Podiatry

## 2021-09-11 DIAGNOSIS — J45909 Unspecified asthma, uncomplicated: Secondary | ICD-10-CM | POA: Insufficient documentation

## 2021-09-11 DIAGNOSIS — B351 Tinea unguium: Secondary | ICD-10-CM

## 2021-09-11 DIAGNOSIS — F4329 Adjustment disorder with other symptoms: Secondary | ICD-10-CM | POA: Insufficient documentation

## 2021-09-11 NOTE — Progress Notes (Signed)
Subjective: 65 year old female presents the office with concerns of toenail fungus.  This been ongoing for many years.  She previously seen another foot doctor several years ago and she was advised not to take Lamisil.  She wants to discuss other treatment options.  Otherwise she states that she has been doing well and her heels have been doing good without any pain.  No other changes in the last saw her and she has no other concerns today.  Objective: AAO x3, NAD DP/PT pulses palpable bilaterally, CRT less than 3 seconds Nails appear to be hypertrophic, dystrophic with yellow, white discoloration appear to be brittle.  There is ridging present within the toenails as well most of the big toenails.  There is no pain in the nails there is no edema, erythema, drainage or any signs of infection. No open lesions or pre-ulcerative lesions.  No pain with calf compression, swelling, warmth, erythema  Assessment: Onychomycosis  Plan: -All treatment options discussed with the patient including all alternatives, risks, complications.  -We discussed treatment options for nail fungus, dystrophy of the nails.  Discussed oral, topical, alternative treatments.  After discussion we will hold off on oral medication.  I ordered a compound cream today through [facility] include both Lamisil, fluconazole as well as urea.  We discussed laser treatment as well as using a try the topical for the next 3 months to see if there is any improvement.  Discussed using the medication for 6 to 12 months.  Discussed side effects of the medication as well and success rates.  -Patient encouraged to call the office with any questions, concerns, change in symptoms.   Brooke Farrell DPM

## 2021-09-19 ENCOUNTER — Ambulatory Visit (INDEPENDENT_AMBULATORY_CARE_PROVIDER_SITE_OTHER): Payer: Medicare Other

## 2021-09-19 DIAGNOSIS — J309 Allergic rhinitis, unspecified: Secondary | ICD-10-CM

## 2021-10-03 ENCOUNTER — Ambulatory Visit (INDEPENDENT_AMBULATORY_CARE_PROVIDER_SITE_OTHER): Payer: Medicare Other

## 2021-10-03 DIAGNOSIS — J309 Allergic rhinitis, unspecified: Secondary | ICD-10-CM

## 2021-10-16 ENCOUNTER — Other Ambulatory Visit: Payer: Self-pay | Admitting: Radiology

## 2021-10-16 DIAGNOSIS — N641 Fat necrosis of breast: Secondary | ICD-10-CM

## 2021-10-20 ENCOUNTER — Ambulatory Visit
Admission: RE | Admit: 2021-10-20 | Discharge: 2021-10-20 | Disposition: A | Discharge status: Home / Self Care | Payer: Medicare Other | Source: Ambulatory Visit | Attending: Family Medicine | Admitting: Family Medicine

## 2021-10-20 ENCOUNTER — Other Ambulatory Visit: Payer: Self-pay

## 2021-10-20 VITALS — BP 113/74 | HR 108 | Temp 99.2°F | Resp 16

## 2021-10-20 DIAGNOSIS — R509 Fever, unspecified: Secondary | ICD-10-CM

## 2021-10-20 DIAGNOSIS — J069 Acute upper respiratory infection, unspecified: Secondary | ICD-10-CM | POA: Diagnosis not present

## 2021-10-20 MED ORDER — OSELTAMIVIR PHOSPHATE 75 MG PO CAPS: 75.0000 mg | ORAL_CAPSULE | Freq: Two times a day (BID) | ORAL | 0 refills | Status: DC

## 2021-10-20 MED ORDER — PROMETHAZINE-DM 6.25-15 MG/5ML PO SYRP: 5.0000 mL | ORAL_SOLUTION | Freq: Four times a day (QID) | ORAL | 0 refills | Status: DC | PRN

## 2021-10-20 NOTE — ED Provider Notes (Signed)
[facility]    CSN: [id number] Arrival date & time: 10/20/21  6734      History   Chief Complaint Chief Complaint  Patient presents with   Chills   Cough   Headache   Generalized Body Aches    HPI Brooke Farrell is a 65 y.o. female.   Presenting today with 1 day history of progressively worsening fever, chills, body aches, cough, fatigue, congestion.  Denies chest pain, shortness of breath, abdominal pain, nausea vomiting or diarrhea.  So far taking Mucinex, Tylenol, ibuprofen for symptoms with mild temporary relief.  No known history of chronic pulmonary disease.  Multiple sick contacts recently.  Negative home COVID test.   Past Medical History:  Diagnosis Date   Acid reflux    Anxiety    Asthma     Patient Active Problem List   Diagnosis Date Noted   Asthma in adult without complication 09/11/2021   Stress and adjustment reaction 09/11/2021   Plantar fasciitis 06/21/2020   Prediabetes 01/14/2020   Primary insomnia 09/14/2019   Pain of left hip joint 06/10/2019   OSA on CPAP 09/10/2018   Seasonal and perennial allergic rhinitis 08/26/2018   Mild intermittent asthma, uncomplicated 08/26/2018   IBS (irritable bowel syndrome) 03/02/2018   Hiatal hernia 11/25/2017   Allergic rhinoconjunctivitis 07/27/2015   Cough    Abnormal urinalysis 04/05/2015   Gastroesophageal reflux disease with esophagitis 01/16/2015   Generalized anxiety disorder 01/16/2015   Esophageal stricture 06/06/2014   Elevated LFTs 05/29/2014   H/O bilateral breast implants 01/21/2013   HPV (human papilloma virus) anogenital infection 05/25/2012   Depression 10/30/2011   Reflux 10/30/2011    Past Surgical History:  Procedure Laterality Date   AUGMENTATION MAMMAPLASTY  1985   NO PAST SURGERIES     TOTAL HIP ARTHROPLASTY  01/2020   dr. Despina Hick    OB History     Gravida  3   Para  1   Term      Preterm      AB  2   Living  1      SAB      IAB      Ectopic       Multiple      Live Births               Home Medications    Prior to Admission medications   Medication Sig Start Date End Date Taking? Authorizing Provider  oseltamivir (TAMIFLU) 75 MG capsule Take 1 capsule (75 mg total) by mouth every 12 (twelve) hours. 10/20/21  Yes Particia Nearing, PA-C  promethazine-dextromethorphan (PROMETHAZINE-DM) 6.25-15 MG/5ML syrup Take 5 mLs by mouth 4 (four) times daily as needed. 10/20/21  Yes Particia Nearing, PA-C  albuterol (VENTOLIN HFA) 108 (90 Base) MCG/ACT inhaler INHALE 2 PUFFS INTO THE LUNGS EVERY 4 (FOUR) HOURS AS NEEDED FOR WHEEZING OR SHORTNESS OF BREATH. 08/16/20   Alfonse Spruce, MD  azelastine (ASTELIN) 0.1 % nasal spray Place 1 spray into both nostrils daily. Use in each nostril as directed 03/06/16   Baxter Hire, MD  busPIRone (BUSPAR) 10 MG tablet Take 10 mg by mouth 2 (two) times daily. 08/14/21   [provider]  cetirizine (ZYRTEC) 10 MG tablet Take 10 mg by mouth daily as needed.     [provider]  montelukast (SINGULAIR) 10 MG tablet Take 1 tablet (10 mg total) by mouth daily as needed. 03/06/16   Baxter Hire, MD  NON FORMULARY [facility]  Anti-fungal (nail)-#1    [provider]  pantoprazole (PROTONIX) 40 MG tablet Take 40 mg by mouth daily. 06/25/21   [provider]  zolpidem (AMBIEN) 10 MG tablet Take 10 mg by mouth at bedtime as needed. 09/18/20   [provider]    Family History Family History  Problem Relation Age of Onset   Breast cancer Mother    Cancer Father        bone   Diabetes Father    Hypertension Father    Heart attack Father    Allergic rhinitis Neg Hx    Angioedema Neg Hx    Asthma Neg Hx    Atopy Neg Hx    Eczema Neg Hx    Immunodeficiency Neg Hx    Urticaria Neg Hx     Social History Social History   Tobacco Use   Smoking status: Never   Smokeless tobacco: Never  Vaping Use   Vaping Use: Never used   Substance Use Topics   Alcohol use: Yes    Alcohol/week: 0.0 standard drinks    Comment: RARE   Drug use: No     Allergies   Prednisone, Robitussin (alcohol free)  [guaifenesin], Sulfa antibiotics, Corticosteroids, and Amoxicillin   Review of Systems Review of Systems Per HPI  Physical Exam Triage Vital Signs ED Triage Vitals  Enc Vitals Group     BP 10/20/21 1000 113/74     Pulse Rate 10/20/21 1000 (!) 108     Resp 10/20/21 1000 16     Temp 10/20/21 1000 99.2 F (37.3 C)     Temp Source 10/20/21 1000 Oral     SpO2 10/20/21 1000 95 %     Weight --      Height --      Head Circumference --      Peak Flow --      Pain Score 10/20/21 1002 8     Pain Loc --      Pain Edu? --      Excl. in GC? --    No data found.  Updated Vital Signs BP 113/74 (BP Location: Right Arm)    Pulse (!) 108    Temp 99.2 F (37.3 C) (Oral)    Resp 16    SpO2 95%   Visual Acuity Right Eye Distance:   Left Eye Distance:   Bilateral Distance:    Right Eye Near:   Left Eye Near:    Bilateral Near:     Physical Exam Vitals and nursing note reviewed.  Constitutional:      Appearance: Normal appearance. She is not ill-appearing.  HENT:     Head: Atraumatic.     Right Ear: Tympanic membrane and external ear normal.     Left Ear: Tympanic membrane and external ear normal.     Nose: Rhinorrhea present.     Mouth/Throat:     Mouth: Mucous membranes are moist.     Pharynx: Posterior oropharyngeal erythema present.  Eyes:     Extraocular Movements: Extraocular movements intact.     Conjunctiva/sclera: Conjunctivae normal.  Cardiovascular:     Rate and Rhythm: Normal rate and regular rhythm.     Heart sounds: Normal heart sounds.  Pulmonary:     Effort: Pulmonary effort is normal.     Breath sounds: Normal breath sounds. No wheezing or rales.  Musculoskeletal:        General: Normal range of motion.  Cervical back: Normal range of motion and neck supple.  Skin:    General: Skin  is warm and dry.  Neurological:     Mental Status: She is alert and oriented to person, place, and time.  Psychiatric:        Mood and Affect: Mood normal.        Thought Content: Thought content normal.        Judgment: Judgment normal.     UC Treatments / Results  Labs (all labs ordered are listed, but only abnormal results are displayed) Labs Reviewed  COVID-19, FLU A+B NAA    EKG   Radiology No results found.  Procedures Procedures (including critical care time)  Medications Ordered in UC Medications - No data to display  Initial Impression / Assessment and Plan / UC Course  I have reviewed the triage vital signs and the nursing notes.  Pertinent labs & imaging results that were available during my care of the patient were reviewed by me and considered in my medical decision making (see chart for details).     Mildly tachycardic in triage, otherwise vital signs reassuring.  Suspect viral upper respiratory infection, COVID and flu testing pending.  We will treat with Tamiflu proactively while awaiting COVID and flu tests results.  Phenergan DM sent for symptomatic benefit.  Discussed over-the-counter supportive medications and home care.  Return for acutely worsening symptoms.  Final Clinical Impressions(s) / UC Diagnoses   Final diagnoses:  Fever, unspecified fever cause  Viral URI with cough   Discharge Instructions   None    ED Prescriptions     Medication Sig Dispense Auth. Provider   oseltamivir (TAMIFLU) 75 MG capsule Take 1 capsule (75 mg total) by mouth every 12 (twelve) hours. 10 capsule Particia Nearing, [place]   promethazine-dextromethorphan (PROMETHAZINE-DM) 6.25-15 MG/5ML syrup Take 5 mLs by mouth 4 (four) times daily as needed. 100 mL Particia Nearing, [place]      PDMP not reviewed this encounter.   Particia Nearing, [place] 10/20/21 1559

## 2021-10-20 NOTE — ED Triage Notes (Signed)
Pt presents with fever, chills, body ache and cough that began yesterday. Negative home covid test. Pt taking OTC mucinex, tylenol, and ibuprofen for sx

## 2021-10-21 LAB — COVID-19, FLU A+B NAA
Influenza A, NAA: DETECTED — AB
Influenza B, NAA: NOT DETECTED
SARS-CoV-2, NAA: NOT DETECTED

## 2021-10-22 ENCOUNTER — Other Ambulatory Visit: Payer: Self-pay

## 2021-10-22 ENCOUNTER — Ambulatory Visit (INDEPENDENT_AMBULATORY_CARE_PROVIDER_SITE_OTHER): Payer: Medicare Other | Admitting: Family Medicine

## 2021-10-22 ENCOUNTER — Encounter: Payer: Self-pay | Admitting: Family Medicine

## 2021-10-22 DIAGNOSIS — K219 Gastro-esophageal reflux disease without esophagitis: Secondary | ICD-10-CM | POA: Diagnosis not present

## 2021-10-22 DIAGNOSIS — J4541 Moderate persistent asthma with (acute) exacerbation: Secondary | ICD-10-CM | POA: Diagnosis not present

## 2021-10-22 DIAGNOSIS — J101 Influenza due to other identified influenza virus with other respiratory manifestations: Secondary | ICD-10-CM | POA: Diagnosis not present

## 2021-10-22 DIAGNOSIS — J302 Other seasonal allergic rhinitis: Secondary | ICD-10-CM

## 2021-10-22 DIAGNOSIS — J3089 Other allergic rhinitis: Secondary | ICD-10-CM

## 2021-10-22 MED ORDER — BENZONATATE 200 MG PO CAPS: 200.0000 mg | ORAL_CAPSULE | Freq: Three times a day (TID) | ORAL | 0 refills | Status: DC | PRN

## 2021-10-22 MED ORDER — PANTOPRAZOLE SODIUM 40 MG PO TBEC: 40.0000 mg | DELAYED_RELEASE_TABLET | Freq: Every day | ORAL | 1 refills | Status: DC

## 2021-10-22 MED ORDER — BUDESONIDE-FORMOTEROL FUMARATE 160-4.5 MCG/ACT IN AERO: 2.0000 | INHALATION_SPRAY | Freq: Two times a day (BID) | RESPIRATORY_TRACT | 5 refills | Status: DC

## 2021-10-22 NOTE — Progress Notes (Signed)
RE: Brooke Farrell MRN: [id number] DOB: 1956/05/25 Date of Telemedicine Visit: 10/22/2021  Referring provider: Eartha Inch, MD Primary care provider: Eartha Inch, MD  Chief Complaint: Cough (Flu A, cough )   Telemedicine Follow Up Visit via Telephone: I connected with Brooke Farrell for a follow up on 10/22/21 by telephone and verified that I am speaking with the correct person using two identifiers.   I discussed the limitations, risks, security and privacy concerns of performing an evaluation and management service by telephone and the availability of in person appointments. I also discussed with the patient that there may be a patient responsible charge related to this service. The patient expressed understanding and agreed to proceed.  Patient is at home  Provider is at the office.  Visit start time: 240 Visit end time: 300 Insurance consent/check in by: [facility] consent and medical assistant/nurse: Ashleigh  History of Present Illness: She is a 65 y.o. female, who is being followed for asthma, allergic rhinitis, and reflux. Her previous allergy office visit was on 08/08/2021 with  Nehemiah Settle, FNP .  In the interim, she visited the emergency department on 10/20/2021 and was positive for influenza A for which she received Tamiflu and promethazine.  At today's visit, she reports her asthma has been poorly controlled with symptoms including shortness of breath, wheeze while lying down, and cough producing thick mucus.  She continues montelukast 10 mg once a day and uses albuterol about once a day for the last several days.  She does report posttussive vomiting 2 times.  Prior to several days ago, she reports her asthma had been well controlled with no shortness of breath, cough, or wheeze.  Allergic rhinitis is reported as poorly controlled with symptoms including clear thin rhinorrhea, nasal congestion, sneezing, and postnasal drainage with frequent throat clearing.  She  does report a headache with a "tight" feeling between her eyes that began 2 days ago.  She continues allergen immunotherapy with no large or local reactions.  She reports a significant decrease in her symptoms of allergic rhinitis while continuing on allergen immunotherapy.  Reflux is reported as well controlled with no heartburn.  She is not currently taking pantoprazole.  She reports she takes this when she is feeling heartburn and has not needed to take this recently.  Her current medications are listed in the chart. Of note, she reports that large doses of prednisone make her irritable.  She is willing to try a Symbicort inhaler for control of asthma symptoms at this time.  She will call the clinic with any worsening of symptoms or if she develops a fever.  Assessment and Plan: Brooke Farrell is a 65 y.o. female with: Patient Instructions  Asthma Begin Symbicort 160-2 puffs twice a day with a spacer to prevent cough or wheeze Continue montelukast 10 mg once a day to prevent cough or wheeze Continue albuterol 2 puffs once every 4 hours as needed for cough or wheeze You may use albuterol 5 to 15 minutes before activity to decrease cough or wheeze  Cough Begin tessalon Perles 200 mg up to three times a day as needed for cough Begin Mucinex [phone] mg twice a day and increase fluid intake to thin mucus Begin Symbicort as listed above We will move forward with a chest x-ray if no improvement in cough in 1 to 2 days  Allergic rhinitis Continue allergen avoidance measures directed toward pollens, pets, mold, dust mite, and cockroach as listed below Continue allergen immunotherapy  once you have returned to baseline.  Continue to have access to an epinephrine autoinjector set while continuing allergen immunotherapy Continue montelukast 10 mg once a day as listed above Continue cetirizine 10 mg once a day as needed for runny nose or itch Continue Flonase 2 sprays in each nostril once a day as needed for  stuffy nose.  In the right nostril, point the applicator out toward the right ear. In the left nostril, point the applicator out toward the left ear Consider saline nasal rinses as needed for nasal symptoms. Use this before any medicated nasal sprays for best result  Reflux Continue dietary and lifestyle modifications as listed below Restart pantoprazole 40 mg once a day as previously provided  Influenza A Continue Tamiflu as previously prescribed  Call the clinic if this treatment plan is not working well for you.  Follow up in 1 month or sooner if needed.   Return in about 4 weeks (around 11/19/2021), or if symptoms worsen or fail to improve.  Meds ordered this encounter  Medications   benzonatate (TESSALON) 200 MG capsule    Sig: Take 1 capsule (200 mg total) by mouth 3 (three) times daily as needed for cough.    Dispense:  20 capsule    Refill:  0   pantoprazole (PROTONIX) 40 MG tablet    Sig: Take 1 tablet (40 mg total) by mouth daily.    Dispense:  30 tablet    Refill:  1   budesonide-formoterol (SYMBICORT) 160-4.5 MCG/ACT inhaler    Sig: Inhale 2 puffs into the lungs 2 (two) times daily.    Dispense:  1 each    Refill:  5    Medication List:  Current Outpatient Medications  Medication Sig Dispense Refill   albuterol (VENTOLIN HFA) 108 (90 Base) MCG/ACT inhaler INHALE 2 PUFFS INTO THE LUNGS EVERY 4 (FOUR) HOURS AS NEEDED FOR WHEEZING OR SHORTNESS OF BREATH. 6.7 each 1   azelastine (ASTELIN) 0.1 % nasal spray Place 1 spray into both nostrils daily. Use in each nostril as directed 90 mL 1   benzonatate (TESSALON) 200 MG capsule Take 1 capsule (200 mg total) by mouth 3 (three) times daily as needed for cough. 20 capsule 0   budesonide-formoterol (SYMBICORT) 160-4.5 MCG/ACT inhaler Inhale 2 puffs into the lungs 2 (two) times daily. 1 each 5   busPIRone (BUSPAR) 10 MG tablet Take 10 mg by mouth 2 (two) times daily.     cetirizine (ZYRTEC) 10 MG tablet Take 10 mg by mouth  daily as needed.      montelukast (SINGULAIR) 10 MG tablet Take 1 tablet (10 mg total) by mouth daily as needed. 90 tablet 1   NON FORMULARY [facility]  Anti-fungal (nail)-#1     oseltamivir (TAMIFLU) 75 MG capsule Take 1 capsule (75 mg total) by mouth every 12 (twelve) hours. 10 capsule 0   pantoprazole (PROTONIX) 40 MG tablet Take 1 tablet (40 mg total) by mouth daily. 30 tablet 1   promethazine-dextromethorphan (PROMETHAZINE-DM) 6.25-15 MG/5ML syrup Take 5 mLs by mouth 4 (four) times daily as needed. 100 mL 0   zolpidem (AMBIEN) 10 MG tablet Take 10 mg by mouth at bedtime as needed.     No current facility-administered medications for this visit.   Allergies: Allergies  Allergen Reactions   Prednisone Other (See Comments)    Patient reports having very bad mood swings and was very angry when on this drug   Robitussin (Alcohol Free)  [Guaifenesin] Swelling  Sulfa Antibiotics Swelling   Corticosteroids    Amoxicillin Diarrhea   I reviewed her past medical history, social history, family history, and environmental history and no significant changes have been reported from previous visit on 08/08/2021.  Review of Systems Objective: Physical Exam Not obtained as encounter was done via telephone.   Previous notes and tests were reviewed.  I discussed the assessment and treatment plan with the patient. The patient was provided an opportunity to ask questions and all were answered. The patient agreed with the plan and demonstrated an understanding of the instructions.   The patient was advised to call back or seek an in-person evaluation if the symptoms worsen or if the condition fails to improve as anticipated.  I provided 20 minutes of non-face-to-face time during this encounter.  It was my pleasure to participate in Brooke Farrell's care today. Please feel free to contact me with any questions or concerns.   Sincerely,  Thermon Leyland, FNP

## 2021-10-22 NOTE — Patient Instructions (Addendum)
Asthma Begin Symbicort 160-2 puffs twice a day with a spacer to prevent cough or wheeze Continue montelukast 10 mg once a day to prevent cough or wheeze Continue albuterol 2 puffs once every 4 hours as needed for cough or wheeze You may use albuterol 5 to 15 minutes before activity to decrease cough or wheeze  Cough Begin tessalon Perles 200 mg up to three times a day as needed for cough Begin Mucinex [id number] mg twice a day and increase fluid intake to thin mucus Begin Symbicort as listed above We will move forward with a chest x-ray if no improvement in cough in 1 to 2 days  Allergic rhinitis Continue allergen avoidance measures directed toward pollens, pets, mold, dust mite, and cockroach as listed below Continue allergen immunotherapy once you have returned to baseline.  Continue to have access to an epinephrine autoinjector set while continuing allergen immunotherapy Continue montelukast 10 mg once a day as listed above Continue cetirizine 10 mg once a day as needed for runny nose or itch Continue Flonase 2 sprays in each nostril once a day as needed for stuffy nose.  In the right nostril, point the applicator out toward the right ear. In the left nostril, point the applicator out toward the left ear Consider saline nasal rinses as needed for nasal symptoms. Use this before any medicated nasal sprays for best result  Reflux Continue dietary and lifestyle modifications as listed below Restart pantoprazole 40 mg once a day as previously provided  Influenza A Continue Tamiflu as previously prescribed  Call the clinic if this treatment plan is not working well for you.  Follow up in 1 month or sooner if needed.  Reducing Pollen Exposure The American Academy of Allergy, Asthma and Immunology suggests the following steps to reduce your exposure to pollen during allergy seasons. Do not hang sheets or clothing out to dry; pollen may collect on these items. Do not mow lawns or spend  time around freshly cut grass; mowing stirs up pollen. Keep windows closed at night.  Keep car windows closed while driving. Minimize morning activities outdoors, a time when pollen counts are usually at their highest. Stay indoors as much as possible when pollen counts or humidity is high and on windy days when pollen tends to remain in the air longer. Use air conditioning when possible.  Many air conditioners have filters that trap the pollen spores. Use a HEPA room air filter to remove pollen form the indoor air you breathe.  Control of Mold Allergen Mold and fungi can grow on a variety of surfaces provided certain temperature and moisture conditions exist.  Outdoor molds grow on plants, decaying vegetation and soil.  The major outdoor mold, Alternaria and Cladosporium, are found in very high numbers during hot and dry conditions.  Generally, a late Summer - Fall peak is seen for common outdoor fungal spores.  Rain will temporarily lower outdoor mold spore count, but counts rise rapidly when the rainy period ends.  The most important indoor molds are Aspergillus and Penicillium.  Dark, humid and poorly ventilated basements are ideal sites for mold growth.  The next most common sites of mold growth are the bathroom and the kitchen.  Outdoor [employer] Use air conditioning and keep windows closed Avoid exposure to decaying vegetation. Avoid leaf raking. Avoid grain handling. Consider wearing a face mask if working in moldy areas.  Indoor Mold Control Maintain humidity below 50%. Clean washable surfaces with 5% bleach solution. Remove sources e.g. Contaminated  carpets.   Control of Dust Mite Allergen Dust mites play a major role in allergic asthma and rhinitis. They occur in environments with high humidity wherever human skin is found. Dust mites absorb humidity from the atmosphere (ie, they do not drink) and feed on organic matter (including shed human and animal skin). Dust mites are a  microscopic type of insect that you cannot see with the naked eye. High levels of dust mites have been detected from mattresses, pillows, carpets, upholstered furniture, bed covers, clothes, soft toys and any woven material. The principal allergen of the dust mite is found in its feces. A gram of dust may contain 1,000 mites and 250,000 fecal particles. Mite antigen is easily measured in the air during house cleaning activities. Dust mites do not bite and do not cause harm to humans, other than by triggering allergies/asthma.  Ways to decrease your exposure to dust mites in your home:  1. Encase mattresses, box springs and pillows with a mite-impermeable barrier or cover  2. Wash sheets, blankets and drapes weekly in hot water (130 F) with detergent and dry them in a dryer on the hot setting.  3. Have the room cleaned frequently with a vacuum cleaner and a damp dust-mop. For carpeting or rugs, vacuuming with a vacuum cleaner equipped with a high-efficiency particulate air (HEPA) filter. The dust mite allergic individual should not be in a room which is being cleaned and should wait 1 hour after cleaning before going into the room.  4. Do not sleep on upholstered furniture (eg, couches).  5. If possible removing carpeting, upholstered furniture and drapery from the home is ideal. Horizontal blinds should be eliminated in the rooms where the person spends the most time (bedroom, study, television room). Washable vinyl, roller-type shades are optimal.  6. Remove all non-washable stuffed toys from the bedroom. Wash stuffed toys weekly like sheets and blankets above.  7. Reduce indoor humidity to less than 50%. Inexpensive humidity monitors can be purchased at most hardware stores. Do not use a humidifier as can make the problem worse and are not recommended.  Control of Cockroach Allergen Cockroach allergen has been identified as an important cause of acute attacks of asthma, especially in urban  settings.  There are fifty-five species of cockroach that exist in the [place], however only three, the [place], [place] species produce allergen that can affect patients with Asthma.  Allergens can be obtained from fecal particles, egg casings and secretions from cockroaches.    Remove food sources. Reduce access to water. Seal access and entry points. Spray runways with 0.5-1% Diazinon or Chlorpyrifos Blow boric acid power under stoves and refrigerator. Place bait stations (hydramethylnon) at feeding sites.  Control of Dog or Cat Allergen Avoidance is the best way to manage a dog or cat allergy. If you have a dog or cat and are allergic to dog or cats, consider removing the dog or cat from the home. If you have a dog or cat but dont want to find it a new home, or if your family wants a pet even though someone in the household is allergic, here are some strategies that may help keep symptoms at bay:  Keep the pet out of your bedroom and restrict it to only a few rooms. Be advised that keeping the dog or cat in only one room will not limit the allergens to that room. Dont pet, hug or kiss the dog or cat; if you do, wash your hands  with soap and water. High-efficiency particulate air (HEPA) cleaners run continuously in a bedroom or living room can reduce allergen levels over time. Regular use of a high-efficiency vacuum cleaner or a central vacuum can reduce allergen levels. Giving your dog or cat a bath at least once a week can reduce airborne allergen.

## 2021-10-23 ENCOUNTER — Telehealth: Payer: Self-pay

## 2021-10-23 NOTE — Telephone Encounter (Signed)
Per provider - called patient - Patient advises she's feeling a little better, Symbicort is helping, breathing is a little better. Patient advised to contact office as needed, go to the nearest ER if needed - patient verbalized understanding, no further questions at this time.

## 2021-10-23 NOTE — Telephone Encounter (Signed)
-----   Message from Hetty Blend, FNP sent at 10/22/2021  5:30 PM EST ----- Can you please call this patient and ask about her breathing? Is the Symbicort helping? Thank you

## 2021-11-07 ENCOUNTER — Ambulatory Visit (INDEPENDENT_AMBULATORY_CARE_PROVIDER_SITE_OTHER): Payer: Medicare Other

## 2021-11-07 DIAGNOSIS — J309 Allergic rhinitis, unspecified: Secondary | ICD-10-CM | POA: Diagnosis not present

## 2021-11-13 ENCOUNTER — Other Ambulatory Visit: Payer: Medicare Other

## 2021-11-14 DIAGNOSIS — J3081 Allergic rhinitis due to animal (cat) (dog) hair and dander: Secondary | ICD-10-CM | POA: Diagnosis not present

## 2021-11-14 NOTE — Progress Notes (Signed)
VIALS MADE. EXP 11-14-22 °

## 2021-11-15 DIAGNOSIS — J3089 Other allergic rhinitis: Secondary | ICD-10-CM | POA: Diagnosis not present

## 2021-11-19 ENCOUNTER — Other Ambulatory Visit: Payer: Self-pay

## 2021-11-19 ENCOUNTER — Ambulatory Visit (INDEPENDENT_AMBULATORY_CARE_PROVIDER_SITE_OTHER): Payer: Medicare Other | Admitting: Podiatry

## 2021-11-19 DIAGNOSIS — B351 Tinea unguium: Secondary | ICD-10-CM

## 2021-11-19 NOTE — Patient Instructions (Signed)
I have ordered a medication for you that will come from [facility] in [place]. They should be calling you to verify insurance and will mail the medication to you. If you live close by then you can go by their pharmacy to pick up the medication. Their phone number is [phone]. If you do not hear from them in the next few days, please give us a call at [phone].   

## 2021-11-21 DIAGNOSIS — B351 Tinea unguium: Secondary | ICD-10-CM | POA: Insufficient documentation

## 2021-11-21 NOTE — Progress Notes (Signed)
Subjective: 66 year old female presents the office for follow-up evaluation of nail fungus.  She has recently finished the compound cream [facility] is asking she should extend this.  She is seen some mild improvement she is asked for other treatments including laser therapy.  No pain in the nails no swelling redness or any drainage.  Patient has been advised not to take Lamisil.  Objective: AAO x3, NAD DP/PT pulses palpable bilaterally, CRT less than 3 seconds Nails appear to be hypertrophic, dystrophic with yellow, white discoloration appear to be brittle.  There is ridging present within the toenails as well most of the big toenails.  There is some clearing on the proximal nail fold.  There is no pain in the nails there is no edema, erythema, drainage or any signs of infection. No open lesions or pre-ulcerative lesions.  No pain with calf compression, swelling, warmth, erythema  Assessment: Onychomycosis  Plan: -All treatment options discussed with the patient including all alternatives, risks, complications.  -Discussed treatment options.  Advised she was not to take Lamisil.  She is in a continue the compound cream through [place] for onychomycosis.  If no improvement next couple months we discussed starting Lamisil and she wants to wait to see if she sees any further improvement.  Can also consider other topical antifungals.  Vivi Barrack DPM

## 2021-11-24 ENCOUNTER — Ambulatory Visit
Admission: RE | Admit: 2021-11-24 | Discharge: 2021-11-24 | Disposition: A | Discharge status: Home / Self Care | Payer: Medicare Other | Source: Ambulatory Visit | Attending: Radiology | Admitting: Radiology

## 2021-11-24 ENCOUNTER — Other Ambulatory Visit: Payer: Self-pay

## 2021-11-24 DIAGNOSIS — N641 Fat necrosis of breast: Secondary | ICD-10-CM

## 2021-11-24 IMAGING — MR MR BREAST BILAT WO/W CM
11 of 14 series · 33 of 48 positions shown · IV contrast (10ML GADAVIST)
Comparison: Previous exam(s).

CLINICAL DATA: 65-year-old female presenting for six-month
follow-up of a benign right breast biopsy demonstrating fat
necrosis.

EXAM:
BILATERAL BREAST MRI WITH AND WITHOUT CONTRAST
TECHNIQUE: Multiplanar, multisequence MR images of both breasts were obtained
prior to and following the intravenous administration of 10 ml of
Gadavist.

[Series 2: t2_tirm_tra ipat (a-p) · axial · 3.0mm · 0.74mm/px · 1 of 60 slices shown]
[im 1/60]
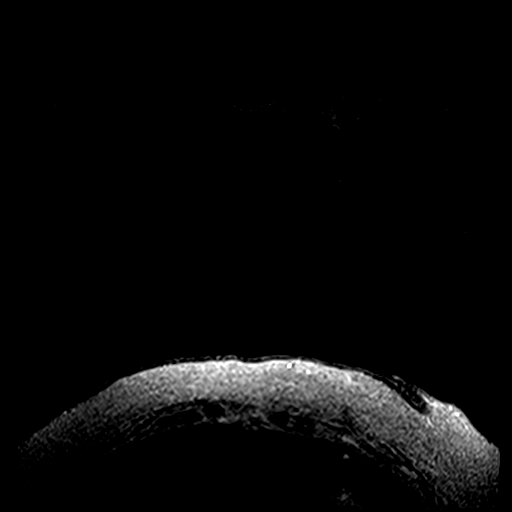

[Series 3: fl3d pre-cm no · axial · non-contrast · 1.2mm · 0.99mm/px · z∈[-77,+94]mm · 4 of 144 slices shown]
[im 1/144]
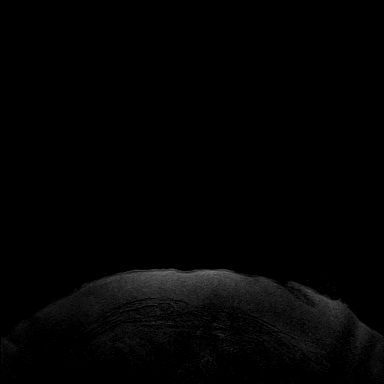
[im 48/144]
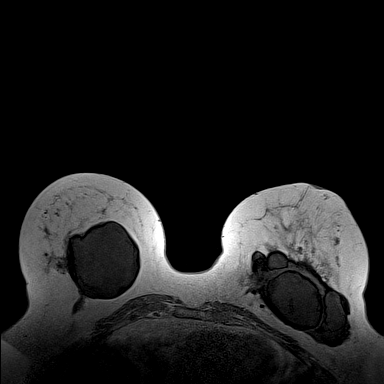
[im 96/144]
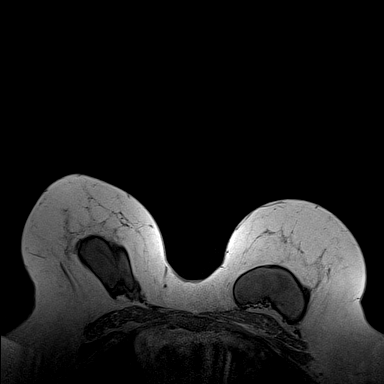
[im 144/144]
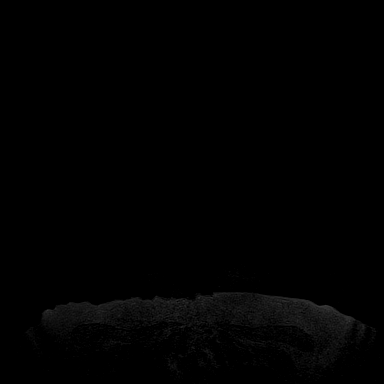

[Series 4: fl3d pre-cm · axial · non-contrast · 1.2mm · 0.99mm/px · z∈[-77,+94]mm · 4 of 144 slices shown]
[im 1/144]
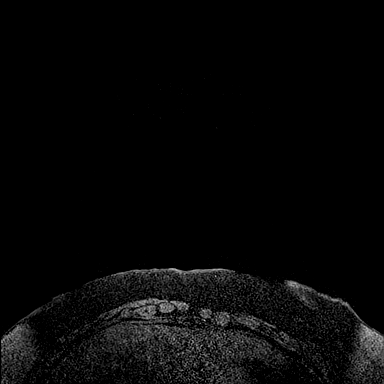
[im 48/144]
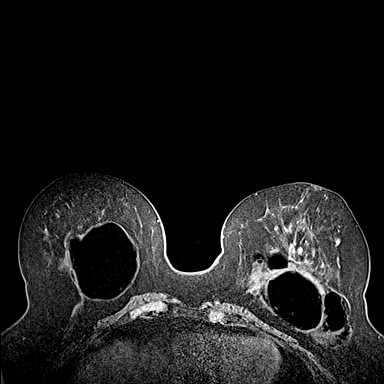
[im 96/144]
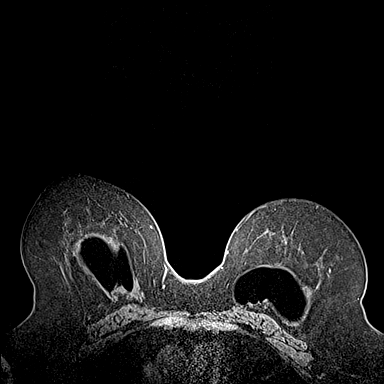
[im 144/144]
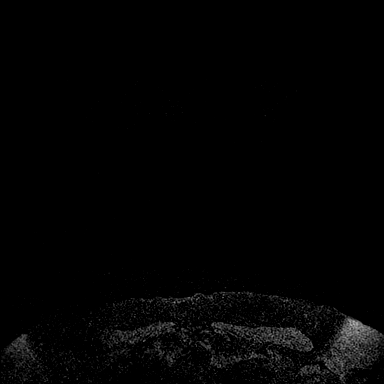

[Series 5: fl3d post-cm 20 · axial · 1.2mm · 0.99mm/px · z∈[-77,+94]mm · 4 of 144 slices shown (1 of 3)]
[im 1/144]
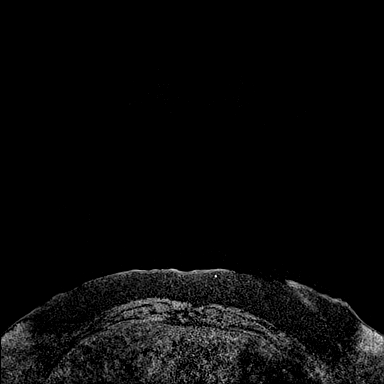
[im 48/144]
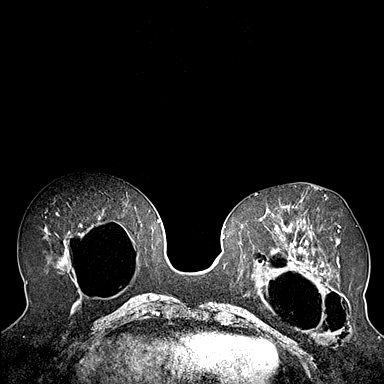
[im 96/144]
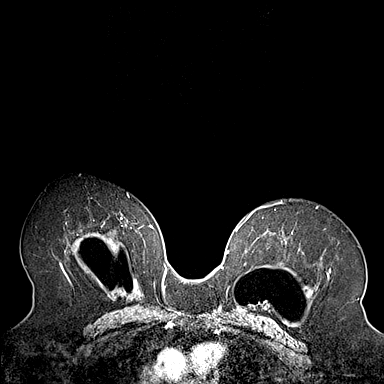
[im 144/144]
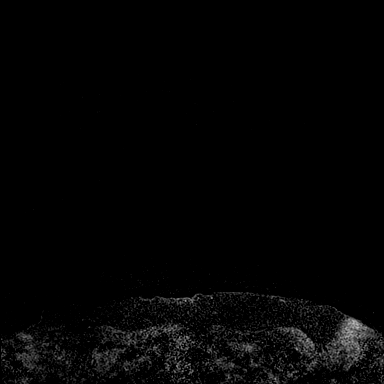

[Series 6: fl3d post-cm 20 · axial · 1.2mm · 0.99mm/px · z∈[-77,+94]mm · 4 of 144 slices shown (2 of 3)]
[im 1/144]
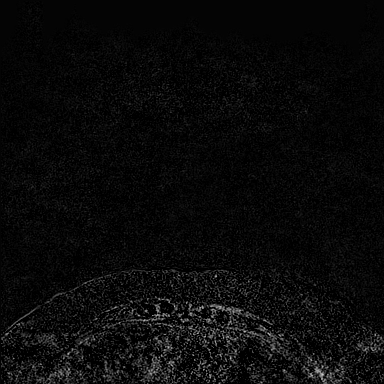
[im 48/144]
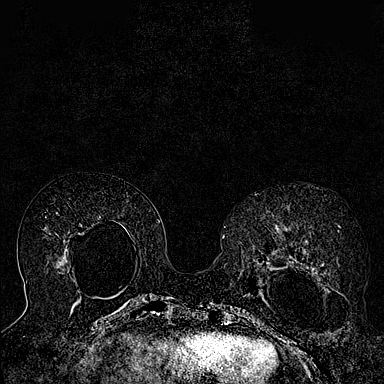
[im 96/144]
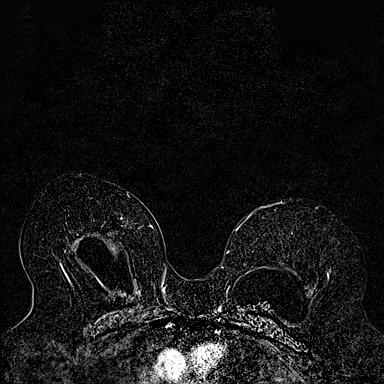
[im 144/144]
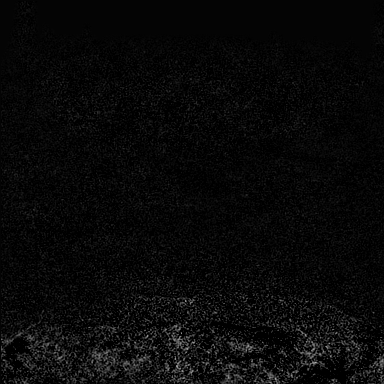

[Series 7: fl3d post-cm 20 · axial · 172.8mm · 0.99mm/px · 1 of 1 slices shown (3 of 3)]
[im 1/1]
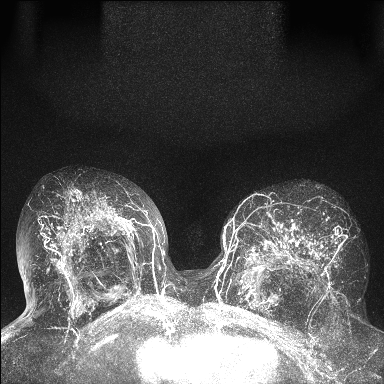

[Series 8: fl3d post-cm 3 · axial · 1.2mm · 0.99mm/px · z∈[-77,+94]mm · 4 of 144 slices shown (1 of 3)]
[im 1/144]
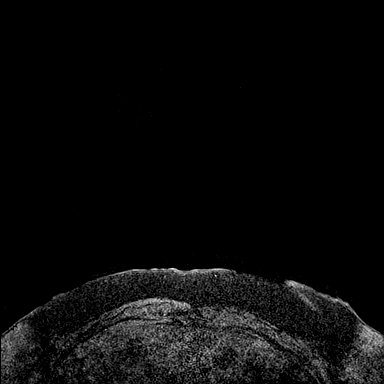
[im 48/144]
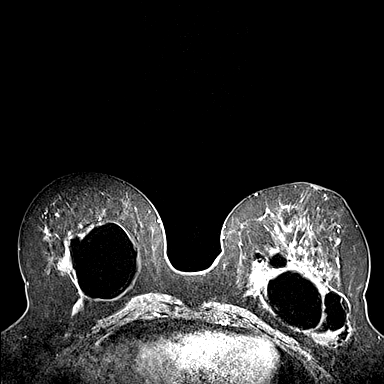
[im 96/144]
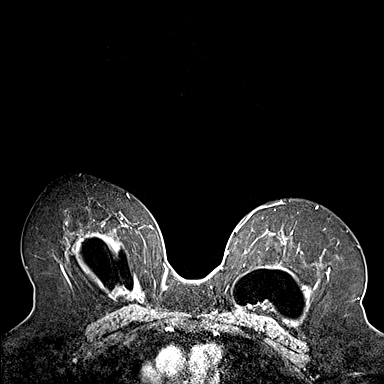
[im 144/144]
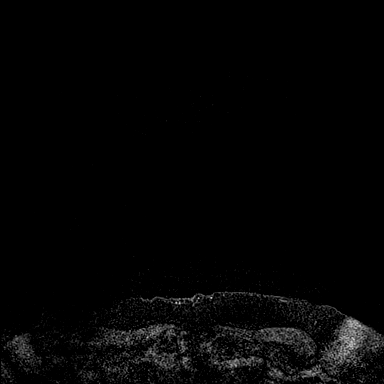

[Series 9: fl3d post-cm 3 · axial · 1.2mm · 0.99mm/px · z∈[-77,+94]mm · 4 of 144 slices shown (2 of 3)]
[im 1/144]
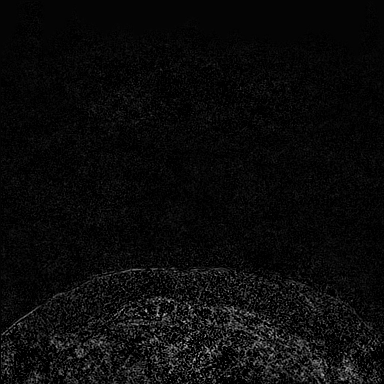
[im 48/144]
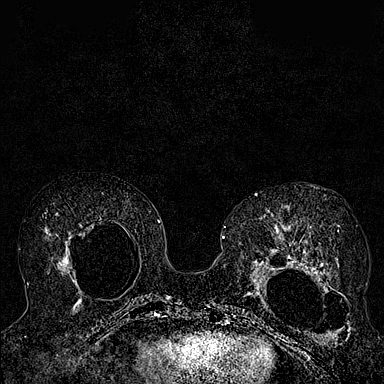
[im 96/144]
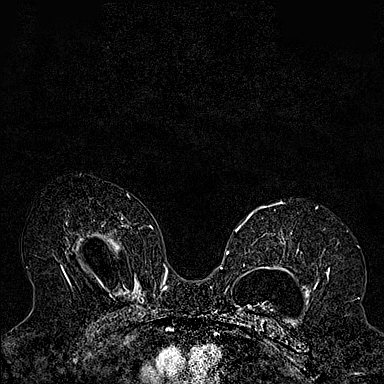
[im 144/144]
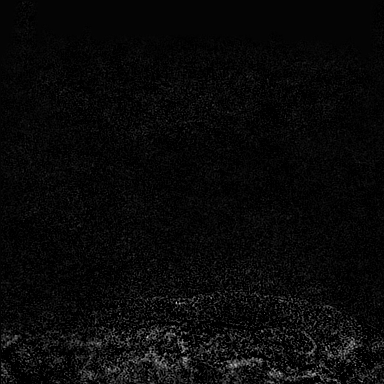

[Series 10: fl3d post-cm 3 · axial · 172.8mm · 0.99mm/px · 1 of 1 slices shown (3 of 3)]
[im 1/1]
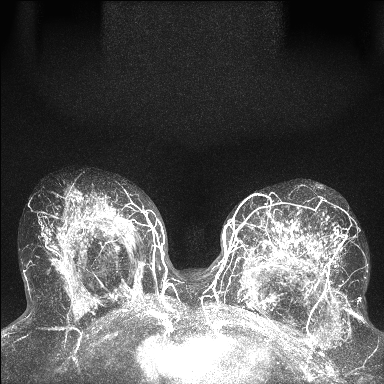

[Series 11: fl3d post-cm 5 · axial · 1.2mm · 0.99mm/px · z∈[-77,+94]mm · 5 of 144 slices shown (1 of 2)]
[im 1/144]
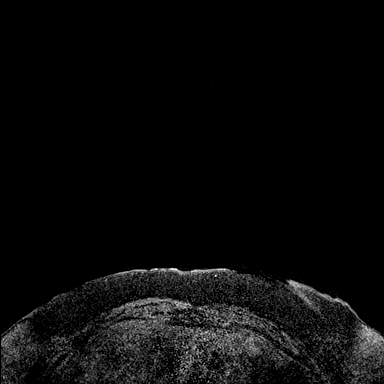
[im 36/144]
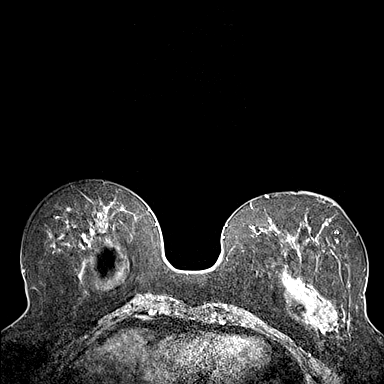
[im 72/144]
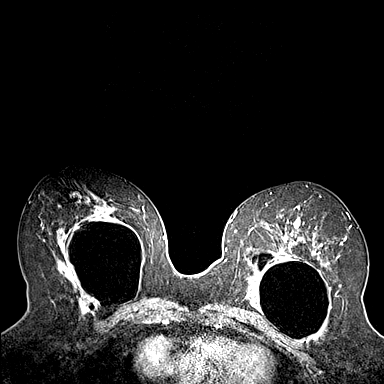
[im 108/144]
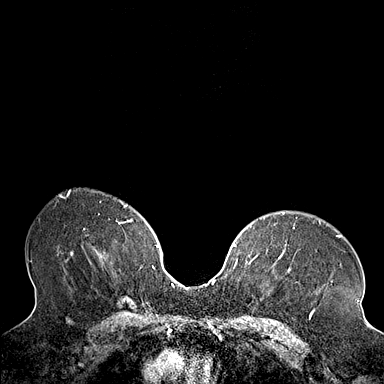
[im 144/144]
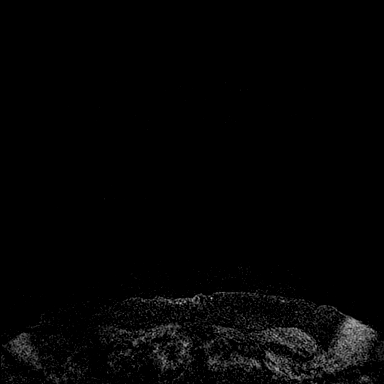

[Series 12: fl3d post-cm 5 · axial · 1.2mm · 0.99mm/px · 1 of 144 slices shown (2 of 2)]
[im 1/144]
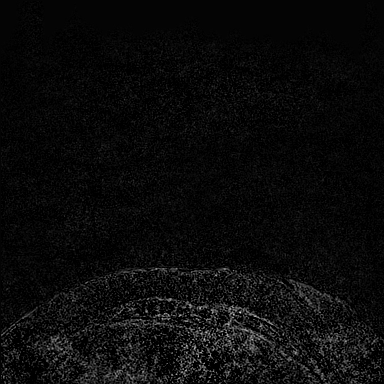

[33 of 48 positions shown; findings below may reference images not displayed]

Three-dimensional MR images were rendered by post-processing of the
original MR data on an independent workstation. The
three-dimensional MR images were interpreted, and findings are
reported in the following complete MRI report for this study. Three
dimensional images were evaluated at the independent interpreting
workstation using the DynaCAD thin client.
FINDINGS: Breast composition: b. Scattered fibroglandular tissue.

Background parenchymal enhancement: Moderate.

Right breast: Susceptibility artifact demonstrated in the
retroareolar right breast consistent with sites of previous biopsy.
Non mass enhancement extending anteriorly in the subareolar right
breast from the patient's breast implant is stable when compared to
prior study. Note is again made of intracapsular and extracapsular
silicone implant rupture on the right.

Left breast: No suspicious mass or abnormal enhancement. Note is
again made of intracapsular next subcapsular silicone implant
rupture on the left.

Lymph nodes: No abnormal appearing lymph nodes.

Ancillary findings:  None.
IMPRESSION: 1. Stable non-mass enhancement in the subareolar right breast.
Previous biopsies to this region demonstrated fat necrosis.
Recommend continued short-term follow-up.
2. Bilateral intra and extra capsular implant rupture.
3. No MRI evidence of malignancy on the left.

RECOMMENDATION:
1. Contrast enhanced breast MRI in 6 months.
2. The patient is due for annual mammography in [DATE].

BI-RADS CATEGORY  3: Probably benign.

## 2021-11-24 MED ORDER — GADOBUTROL 1 MMOL/ML IV SOLN
10.0000 mL | Freq: Once | INTRAVENOUS | Status: AC | PRN
  Administered 2021-11-24: 10 mL via INTRAVENOUS

## 2021-11-30 ENCOUNTER — Other Ambulatory Visit: Payer: Self-pay

## 2021-11-30 ENCOUNTER — Encounter: Payer: Self-pay | Admitting: Allergy & Immunology

## 2021-11-30 ENCOUNTER — Ambulatory Visit: Payer: Self-pay

## 2021-11-30 ENCOUNTER — Ambulatory Visit (INDEPENDENT_AMBULATORY_CARE_PROVIDER_SITE_OTHER): Payer: Medicare Other | Admitting: Allergy & Immunology

## 2021-11-30 VITALS — BP 124/78 | HR 72 | Temp 97.2°F | Resp 16

## 2021-11-30 DIAGNOSIS — K219 Gastro-esophageal reflux disease without esophagitis: Secondary | ICD-10-CM | POA: Diagnosis not present

## 2021-11-30 DIAGNOSIS — J4540 Moderate persistent asthma, uncomplicated: Secondary | ICD-10-CM | POA: Diagnosis not present

## 2021-11-30 DIAGNOSIS — J302 Other seasonal allergic rhinitis: Secondary | ICD-10-CM

## 2021-11-30 DIAGNOSIS — J309 Allergic rhinitis, unspecified: Secondary | ICD-10-CM | POA: Diagnosis not present

## 2021-11-30 DIAGNOSIS — J3089 Other allergic rhinitis: Secondary | ICD-10-CM | POA: Diagnosis not present

## 2021-11-30 MED ORDER — FLUTICASONE PROPIONATE 50 MCG/ACT NA SUSP: 2.0000 | Freq: Every day | NASAL | 1 refills | Status: DC

## 2021-11-30 MED ORDER — AZELASTINE HCL 0.1 % NA SOLN: 1.0000 | Freq: Every day | NASAL | 1 refills | Status: DC

## 2021-11-30 MED ORDER — ALBUTEROL SULFATE HFA 108 (90 BASE) MCG/ACT IN AERS: 2.0000 | INHALATION_SPRAY | RESPIRATORY_TRACT | 1 refills | Status: DC | PRN

## 2021-11-30 MED ORDER — MONTELUKAST SODIUM 10 MG PO TABS: 10.0000 mg | ORAL_TABLET | Freq: Every day | ORAL | 1 refills | Status: DC | PRN

## 2021-11-30 NOTE — Progress Notes (Signed)
FOLLOW UP  Date of Service/Encounter:  11/30/21   Assessment:   Seasonal and perennial allergic rhinitis (grasses, weeds, ragweed, trees, indoor mold, dust mites, cats, dogs, roach) - doing well on the re-mixed allergy vials   Mild intermittent asthma, uncomplicated   Viral URI - COVID testing pending   Fully vaccinated to COVID-19    Plan/Recommendations:   1. Seasonal and perennial allergic rhinitis - Continue with allergy shots at the same schedule. - Continue with Flonase one spray per nostril daily as needed.  - Continue with Singulair 10mg  daily.  - 2. Mild persistent asthma, uncomplicated - Lung testing looks great today. - I think we can hold off on a daily controller medication for your asthma.  - Your Singulair can be helping with your breathing as well, so I would continue with that.  - Daily controller medication(s): Singulair 10mg  daily - Prior to physical activity: albuterol 2 puffs 10-15 minutes before physical activity. - Rescue medications: albuterol 4 puffs every 4-6 hours as needed - Asthma control goals:  * Full participation in all desired activities (may need albuterol before activity) * Albuterol use two time or less a week on average (not counting use with activity) * Cough interfering with sleep two time or less a month * Oral steroids no more than once a year * No hospitalizations  3. Gastroesophageal reflux disease - Continue with pantoprazole 40mg  daily as needed.  4. Return in about 6 months (around 05/30/2022).   Subjective:   LINZY LAURY is a 66 y.o. female presenting today for follow up of  Chief Complaint  Patient presents with   Follow-up    DELCIA SPITZLEY has a history of the following: Patient Active Problem List   Diagnosis Date Noted   Onychomycosis 11/21/2021   Asthma in adult without complication 09/11/2021   Stress and adjustment reaction 09/11/2021   Plantar fasciitis 06/21/2020   Prediabetes 01/14/2020    Primary insomnia 09/14/2019   Pain of left hip joint 06/10/2019   OSA on CPAP 09/10/2018   Seasonal and perennial allergic rhinitis 08/26/2018   Mild intermittent asthma, uncomplicated 08/26/2018   IBS (irritable bowel syndrome) 03/02/2018   Hiatal hernia 11/25/2017   Allergic rhinoconjunctivitis 07/27/2015   Cough    Abnormal urinalysis 04/05/2015   Gastroesophageal reflux disease with esophagitis 01/16/2015   Generalized anxiety disorder 01/16/2015   Esophageal stricture 06/06/2014   Elevated LFTs 05/29/2014   H/O bilateral breast implants 01/21/2013   HPV (human papilloma virus) anogenital infection 05/25/2012   Depression 10/30/2011   Reflux 10/30/2011    History obtained from: chart review and patient.  Alahni is a 66 y.o. female presenting for a follow up visit.  She was last seen as a televisit in December 2022.  At that time, she was started on Symbicort 160 mcg 2 puffs twice daily as well as montelukast and albuterol.  She was started on Maureen Ralphs as well as Mucinex.  For her allergic rhinitis, she was continued on her allergen immunotherapy as well as montelukast, cetirizine, and Flonase.  Her reflux was not well controlled.  She was restarted on pantoprazole 40 mg daily.  Continued on Tamiflu.  Since the last visit, she has done well. She did not get pneumonia following her influenza, which she was very happy about.  Asthma/Respiratory Symptom History: She is no longer on the Symbicort.  In fact, she says the only inhaler she got from the pharmacy from 70 was albuterol.  Regardless, her coughing  and shortness of breath have improved as she has recovered from her influenza.  She has not been needing her rescue inhaler at all.  She does remain on the montelukast.  Allergic Rhinitis Symptom History: In addition to the montelukast, she is on her nasal sprays.  She does need refills for both of these.  She does use cetirizine especially on shot days. She did not need  antibiotics after contracting influenza and finishing her Tamiflu.  Allergy shots are going well.  She no longer has large local reactions.  She is getting 0.5 mL every 3 weeks of her injections.  As per usual, she has a lot of drama at home.  Apparently since November, a 42 year old stepson has been living with them.  He has a history of 4 different DUIs and was released from prison in November.  He has 3X wives and 1 child from all of those marriages.  He has gotten a job with an Econom[employer], but has made no moves to move out of the house.  They have brought this up with him and he has hold them that he is just waiting for God to tell him that he needs to move out of the house.  I did offer to dress up as God and come talk to him and Aydin seemed up for that! He has bipolar disorder, clearly, and has not been on his medications.  Otherwise, there have been no changes to her past medical history, surgical history, family history, or social history.    Review of Systems  Constitutional: Negative.  Negative for fever, malaise/fatigue and weight loss.  HENT: Negative.  Negative for congestion, ear discharge and ear pain.   Eyes:  Negative for pain, discharge and redness.  Respiratory:  Negative for cough, sputum production, shortness of breath and wheezing.   Cardiovascular: Negative.  Negative for chest pain and palpitations.  Gastrointestinal:  Negative for abdominal pain, constipation, diarrhea, heartburn, nausea and vomiting.  Skin: Negative.  Negative for itching and rash.  Neurological:  Negative for dizziness and headaches.  Endo/Heme/Allergies:  Negative for environmental allergies. Does not bruise/bleed easily.      Objective:   Blood pressure 124/78, pulse 72, temperature (!) 97.2 F (36.2 C), temperature source Temporal, resp. rate 16, SpO2 96 %. There is no height or weight on file to calculate BMI.    Physical Exam Vitals reviewed.  Constitutional:       Appearance: She is well-developed.     Comments: Talkative female. Cooperative with the exam.   HENT:     Head: Normocephalic and atraumatic.     Right Ear: Tympanic membrane, ear canal and external ear normal.     Left Ear: Tympanic membrane, ear canal and external ear normal.     Nose: No nasal deformity, septal deviation, mucosal edema or rhinorrhea.     Right Turbinates: Enlarged, swollen and pale.     Left Turbinates: Enlarged, swollen and pale.     Right Sinus: No maxillary sinus tenderness or frontal sinus tenderness.     Left Sinus: No maxillary sinus tenderness or frontal sinus tenderness.     Mouth/Throat:     Mouth: Mucous membranes are not pale and not dry.     Pharynx: Uvula midline.     Comments: Cobblestoning present in the posterior oropharynx.  Eyes:     General: Lids are normal. No allergic shiner.       Right eye: No discharge.  Left eye: No discharge.     Conjunctiva/sclera: Conjunctivae normal.     Right eye: Right conjunctiva is not injected. No chemosis.    Left eye: Left conjunctiva is not injected. No chemosis.    Pupils: Pupils are equal, round, and reactive to light.  Cardiovascular:     Rate and Rhythm: Normal rate and regular rhythm.     Heart sounds: Normal heart sounds.  Pulmonary:     Effort: Pulmonary effort is normal. No tachypnea, accessory muscle usage or respiratory distress.     Breath sounds: Normal breath sounds. No wheezing, rhonchi or rales.     Comments: Moving air well in all lung fields. No increased work of breathing noted. Chest:     Chest wall: No tenderness.  Lymphadenopathy:     Cervical: No cervical adenopathy.  Skin:    Coloration: Skin is not pale.     Findings: No abrasion, erythema, petechiae or rash. Rash is not papular, urticarial or vesicular.  Neurological:     Mental Status: She is alert.  Psychiatric:        Behavior: Behavior is cooperative.     Diagnostic studies:    Spirometry: results normal (FEV1:  2.34/86%, FVC: 3.26/93%, FEV1/FVC: 72%).    Spirometry consistent with normal pattern.   Allergy Studies: none        Malachi BondsJoel Eron Goble, MD  [facility]

## 2021-11-30 NOTE — Patient Instructions (Addendum)
1. Seasonal and perennial allergic rhinitis - Continue with allergy shots at the same schedule. - Continue with Flonase one spray per nostril daily as needed.  - Continue with Singulair 10mg  daily.  - 2. Mild persistent asthma, uncomplicated - Lung testing looks great today. - I think we can hold off on a daily controller medication for your asthma.  - Your Singulair can be helping with your breathing as well, so I would continue with that.  - Daily controller medication(s): Singulair 10mg  daily - Prior to physical activity: albuterol 2 puffs 10-15 minutes before physical activity. - Rescue medications: albuterol 4 puffs every 4-6 hours as needed - Asthma control goals:  * Full participation in all desired activities (may need albuterol before activity) * Albuterol use two time or less a week on average (not counting use with activity) * Cough interfering with sleep two time or less a month * Oral steroids no more than once a year * No hospitalizations  3. Gastroesophageal reflux disease - Continue with pantoprazole 40mg  daily as needed.  4. Return in about 6 months (around 05/30/2022).    Please inform of any Emergency Department visits, hospitalizations, or changes in symptoms. Call before going to the ED for breathing or allergy symptoms since we might be able to fit you in for a sick visit. Feel free to contact 06/01/2022 anytime with any questions, problems, or concerns.  It was a pleasure to see you again today!  Websites that have reliable patient information: 1. American Academy of Asthma, Allergy, and Immunology: www.aaaai.org 2. Food Allergy Research and Education (FARE): foodallergy.org 3. Mothers of Asthmatics: http://www.asthmacommunitynetwork.org 4. American College of Allergy, Asthma, and Immunology: www.acaai.org   COVID-19 Vaccine Information can be found at: [place] For questions related to  vaccine distribution or appointments, please email [email or web address] or call [phone].   We realize that you might be concerned about having an allergic reaction to the COVID19 vaccines. To help with that concern, WE ARE OFFERING THE COVID19 VACCINES IN OUR OFFICE! Ask the front desk for dates!     Like [place] [email or web address] and Instagram for our latest updates!      A healthy democracy works best when [phone] participate! Make sure you are registered to vote! If you have moved or changed any of your contact information, you will need to get this updated before voting!  In some cases, you MAY be able to register to vote online: [place]

## 2021-12-16 DIAGNOSIS — K8020 Calculus of gallbladder without cholecystitis without obstruction: Secondary | ICD-10-CM | POA: Insufficient documentation

## 2021-12-16 DIAGNOSIS — R1313 Dysphagia, pharyngeal phase: Secondary | ICD-10-CM | POA: Insufficient documentation

## 2021-12-16 DIAGNOSIS — R1310 Dysphagia, unspecified: Secondary | ICD-10-CM | POA: Insufficient documentation

## 2021-12-16 DIAGNOSIS — K2980 Duodenitis without bleeding: Secondary | ICD-10-CM | POA: Insufficient documentation

## 2021-12-26 ENCOUNTER — Ambulatory Visit (INDEPENDENT_AMBULATORY_CARE_PROVIDER_SITE_OTHER): Payer: Medicare Other

## 2021-12-26 DIAGNOSIS — J309 Allergic rhinitis, unspecified: Secondary | ICD-10-CM

## 2022-01-23 ENCOUNTER — Ambulatory Visit (INDEPENDENT_AMBULATORY_CARE_PROVIDER_SITE_OTHER): Payer: Medicare Other

## 2022-01-23 DIAGNOSIS — J309 Allergic rhinitis, unspecified: Secondary | ICD-10-CM | POA: Diagnosis not present

## 2022-02-06 ENCOUNTER — Ambulatory Visit (INDEPENDENT_AMBULATORY_CARE_PROVIDER_SITE_OTHER): Payer: Medicare Other

## 2022-02-06 DIAGNOSIS — J309 Allergic rhinitis, unspecified: Secondary | ICD-10-CM | POA: Diagnosis not present

## 2022-02-13 ENCOUNTER — Ambulatory Visit (INDEPENDENT_AMBULATORY_CARE_PROVIDER_SITE_OTHER): Payer: Medicare Other

## 2022-02-13 DIAGNOSIS — J309 Allergic rhinitis, unspecified: Secondary | ICD-10-CM

## 2022-02-17 ENCOUNTER — Inpatient Hospital Stay
Admission: RE | Admit: 2022-02-17 | Discharge: 2022-02-17 | Disposition: A | Discharge status: Home / Self Care | Payer: Medicare Other | Source: Ambulatory Visit | Attending: Family Medicine | Admitting: Family Medicine

## 2022-02-20 ENCOUNTER — Ambulatory Visit (INDEPENDENT_AMBULATORY_CARE_PROVIDER_SITE_OTHER): Payer: Medicare Other

## 2022-02-20 DIAGNOSIS — J309 Allergic rhinitis, unspecified: Secondary | ICD-10-CM | POA: Diagnosis not present

## 2022-02-27 ENCOUNTER — Ambulatory Visit (INDEPENDENT_AMBULATORY_CARE_PROVIDER_SITE_OTHER): Payer: Medicare Other

## 2022-02-27 DIAGNOSIS — J309 Allergic rhinitis, unspecified: Secondary | ICD-10-CM | POA: Diagnosis not present

## 2022-03-06 ENCOUNTER — Ambulatory Visit (INDEPENDENT_AMBULATORY_CARE_PROVIDER_SITE_OTHER): Payer: Medicare Other

## 2022-03-06 DIAGNOSIS — J309 Allergic rhinitis, unspecified: Secondary | ICD-10-CM | POA: Diagnosis not present

## 2022-03-19 ENCOUNTER — Ambulatory Visit (INDEPENDENT_AMBULATORY_CARE_PROVIDER_SITE_OTHER): Payer: Medicare Other | Admitting: Podiatry

## 2022-03-19 DIAGNOSIS — B351 Tinea unguium: Secondary | ICD-10-CM | POA: Diagnosis not present

## 2022-03-19 MED ORDER — EFINACONAZOLE 10 % EX SOLN: 1.0000 [drp] | Freq: Every day | CUTANEOUS | 11 refills | Status: DC

## 2022-03-19 NOTE — Progress Notes (Signed)
Subjective: ?66 year old female presents the office today for follow-up evaluation of nail fungus.  She has been using the compound cream through [employer] and she has not seen much improvement.  She has no pain in the nails and she has no swelling or redness or any drainage.  She has no other concerns to her lower extremities today. ? ?Objective: ?AAO x3, NAD ?DP/PT pulses palpable bilaterally, CRT less than 3 seconds ?Nails about the same continue be hypertrophic, dystrophic with yellow, white discoloration and brittle.  No edema, erythema, drainage or pus or any signs of infection noted. ?No pain with calf compression, swelling, warmth, erythema ? ?Assessment: ?Onychomycosis ? ?Plan: ?-All treatment options discussed with the patient including all alternatives, risks, complications.  ?-Discussed different treatment options including different topicals, oral versus alternative treatment such as laser.  Ultimately we decided proceed with Jublia which I ordered today.  We will give this a couple months if no improvement will start laser. ?-Patient encouraged to call the office with any questions, concerns, change in symptoms.  ? ?Vivi Barrack DPM ? ?

## 2022-03-19 NOTE — Patient Instructions (Signed)
Efinaconazole Topical Solution What is this medication? EFINACONAZOLE (e FEE na KON a zole) is an antifungal medicine. It is used to treat certain kinds of fungal infections of the toenail. This medicine may be used for other purposes; ask your health care provider or pharmacist if you have questions. COMMON BRAND NAME(S): JUBLIA What should I tell my care team before I take this medication? They need to know if you have any of these conditions: an unusual or allergic reaction to efinaconazole, other medicines, foods, dyes or preservatives pregnant or trying to get pregnant breast-feeding How should I use this medication? This medicine is for external use only. Do not take by mouth. Follow the directions on the label. Wash hands before and after use. Apply this medicine using the provided brush to cover the entire toenail. Do not use your medicine more often than directed. Finish the full course prescribed by your doctor or health care professional even if you think your condition is better. Do not stop using except on the advice of your doctor or health care professional. Talk to your pediatrician regarding the use of this medicine in children. While this drug may be prescribed for children as young as 6 years for selected conditions, precautions do apply. Overdosage: If you think you have taken too much of this medicine contact a poison control center or emergency room at once. NOTE: This medicine is only for you. Do not share this medicine with others. What if I miss a dose? If you miss a dose, use it as soon as you can. If it is almost time for your next dose, use only that dose. Do not use double or extra doses. What may interact with this medication? Interactions have not been studied. Do not use any other nail products (i.e., nail polish, pedicures) during treatment with this medicine. This list may not describe all possible interactions. Give your health care provider a list of all the  medicines, herbs, non-prescription drugs, or dietary supplements you use. Also tell them if you smoke, drink alcohol, or use illegal drugs. Some items may interact with your medicine. What should I watch for while using this medication? Do not get this medicine in your eyes. If you do, rinse out with plenty of cool tap water. Tell your doctor or health care professional if your symptoms do not start to get better or if they get worse. Wait for at least 10 minutes after bathing before applying this medication. After bathing, make sure that your feet are very dry. Fungal infections like moist conditions. Do not walk around barefoot. To help prevent reinfection, wear freshly washed cotton, not synthetic clothing. Tell your doctor or health care professional if you develop sores or blisters that do not heal properly. If your nail infection returns after you stop using this medicine, contact your doctor or health care professional. What side effects may I notice from receiving this medication? Side effects that you should report to your doctor or health care professional as soon as possible: allergic reactions like skin rash, itching or hives, swelling of the face, lips, or tongue ingrown toenail Side effects that usually do not require medical attention (report to your doctor or health care professional if they continue or are bothersome): mild skin irritation, burning, or itching This list may not describe all possible side effects. Call your doctor for medical advice about side effects. You may report side effects to FDA at 1-800-FDA-1088. Where should I keep my medication? Keep out of the   reach of children. Store at room temperature between 20 and 25 degrees C (68 and 77 degrees F). Keep this medicine in the original container. Throw away any unused medicine after the expiration date. This medicine is flammable. Avoid exposure to heat, fire, flame, and smoking. NOTE: This sheet is a summary. It may  not cover all possible information. If you have questions about this medicine, talk to your doctor, pharmacist, or health care provider.  2023 Elsevier/Gold Standard (2019-03-03 00:00:00)  

## 2022-04-03 ENCOUNTER — Ambulatory Visit (INDEPENDENT_AMBULATORY_CARE_PROVIDER_SITE_OTHER): Payer: Medicare Other

## 2022-04-03 DIAGNOSIS — J309 Allergic rhinitis, unspecified: Secondary | ICD-10-CM | POA: Diagnosis not present

## 2022-04-16 NOTE — Progress Notes (Unsigned)
PATIENT: Brooke Farrell DOB: September 04, 1956  REASON FOR VISIT: follow up HISTORY FROM: patient PRIMARY NEUROLOGIST:  Dr. Frances Furbish  Chief Complaint  Patient presents with   Follow-up    Pt in 66  pt is here for CPAP follow up  Pt states  everything going well . Pt states she needs new CPAP supplies      HISTORY OF PRESENT ILLNESS: Today 04/17/22:  Brooke Farrell is a 66 year old female with a history of obstructive sleep apnea on CPAP.  She returns today for follow-up.  She reports that the CPAP is working well for her.  She does not like to sleep without it.  Download is below    04/11/21: Brooke Farrell is a 66 year old female with a history of obstructive sleep apnea on CPAP.  She returns today for follow-up.  She states that everything is going well with the CPAP.  Her only issue is that she needs new supplies and has not been able to get this from her DME company.    HISTORY (copied from Dr. Teofilo Pod note)  04/05/2020: I reviewed her CPAP compliance data from 03/05/2020 through 04/03/2020, which is a total of 30 days, during which time she used her machine every night with percent use days greater than 4 hours at 97%, indicating excellent compliance with an average usage of 7 hours and 55 minutes, residual AHI at goal at 3.6/h, leak on the higher side with a 95th percentile at 19.5 L/min, pressure of 10 cm with EPR of 3.  She reports feeling stable and doing well with her CPAP, indicates full compliance and ongoing good results.  She is up-to-date with her supplies and uses nasal pillows.  Although the pressure is currently at 10 cm, it seems adequate at this time.   The patient's allergies, current medications, family history, past medical history, past social history, past surgical history and problem list were reviewed and updated as appropriate.   REVIEW OF SYSTEMS: Out of a complete 14 system review of symptoms, the patient complains only of the following symptoms, and all other reviewed  systems are negative.   ESS 3  ALLERGIES: Allergies  Allergen Reactions   Prednisone Other (See Comments)    Patient reports having very bad mood swings and was very angry when on this drug   Robitussin (Alcohol Free)  [Guaifenesin] Swelling   Sulfa Antibiotics Swelling   Corticosteroids    Amoxicillin Diarrhea    HOME MEDICATIONS: Outpatient Medications Prior to Visit  Medication Sig Dispense Refill   albuterol (VENTOLIN HFA) 108 (90 Base) MCG/ACT inhaler Inhale 2 puffs into the lungs every 4 (four) hours as needed for wheezing or shortness of breath. 18 g 1   azelastine (ASTELIN) 0.1 % nasal spray Place 1 spray into both nostrils daily. Use in each nostril as directed 90 mL 1   budesonide-formoterol (SYMBICORT) 160-4.5 MCG/ACT inhaler Inhale 2 puffs into the lungs 2 (two) times daily. 1 each 5   busPIRone (BUSPAR) 10 MG tablet Take 10 mg by mouth 2 (two) times daily.     cetirizine (ZYRTEC) 10 MG tablet Take 10 mg by mouth daily as needed.      Efinaconazole 10 % SOLN Apply 1 drop topically daily. 4 mL 11   fluticasone (FLONASE) 50 MCG/ACT nasal spray Place 2 sprays into both nostrils daily. 48 g 1   montelukast (SINGULAIR) 10 MG tablet Take 1 tablet (10 mg total) by mouth daily as needed. 90 tablet 1   NON  FORMULARY [facility]  Anti-fungal (nail)-#1     pantoprazole (PROTONIX) 40 MG tablet Take 1 tablet (40 mg total) by mouth daily. 30 tablet 1   zolpidem (AMBIEN) 10 MG tablet Take 10 mg by mouth at bedtime as needed.     No facility-administered medications prior to visit.    PAST MEDICAL HISTORY: Past Medical History:  Diagnosis Date   Acid reflux    Anxiety    Asthma     PAST SURGICAL HISTORY: Past Surgical History:  Procedure Laterality Date   AUGMENTATION MAMMAPLASTY  1985   NO PAST SURGERIES     TOTAL HIP ARTHROPLASTY  01/2020   dr. Despina Hick    FAMILY HISTORY: Family History  Problem Relation Age of Onset   Breast cancer Mother    Cancer Father         bone   Diabetes Father    Hypertension Father    Heart attack Father    Allergic rhinitis Neg Hx    Angioedema Neg Hx    Asthma Neg Hx    Atopy Neg Hx    Eczema Neg Hx    Immunodeficiency Neg Hx    Urticaria Neg Hx    Sleep apnea Neg Hx     SOCIAL HISTORY: Social History   Socioeconomic History   Marital status: Married    Spouse name: Not on file   Number of children: Not on file   Years of education: Not on file   Highest education level: Not on file  Occupational History   Not on file  Tobacco Use   Smoking status: Never   Smokeless tobacco: Never  Vaping Use   Vaping Use: Never used  Substance and Sexual Activity   Alcohol use: Yes    Alcohol/week: 0.0 standard drinks of alcohol    Comment: RARE   Drug use: No   Sexual activity: Not Currently    Partners: Male    Birth control/protection: None    Comment: 1st intercourse- 16, partners- 6, married- 25 yrs  Other Topics Concern   Not on file  Social History Narrative   Not on file   Social Determinants of Health   Financial Resource Strain: Not on file  Food Insecurity: Not on file  Transportation Needs: Not on file  Physical Activity: Not on file  Stress: Not on file  Social Connections: Not on file  Intimate Partner Violence: Not on file      PHYSICAL EXAM  Vitals:   04/17/22 0833  BP: 124/81  Pulse: 83  Weight: 218 lb 3.2 oz (99 kg)  Height: 5\' 8"  (1.727 m)   Body mass index is 33.18 kg/m.  Generalized: Well developed, in no acute distress  Chest: Lungs clear to auscultation bilaterally  Neurological examination  Mentation: Alert oriented to time, place, history taking. Follows all commands speech and language fluent Cranial nerve II-XII: Extraocular movements were full, visual field were full on confrontational test Head turning and shoulder shrug  were normal and symmetric. Gait and station: Gait is normal.    DIAGNOSTIC DATA (LABS, IMAGING, TESTING) - I reviewed patient  records, labs, notes, testing and imaging myself where available.     ASSESSMENT AND PLAN 66 y.o. year old female  has a past medical history of Acid reflux, Anxiety, and Asthma. here with:  OSA on CPAP  - CPAP compliance excellent - Good treatment of AHI  - Encourage patient to use CPAP nightly and > 4 hours each night - F/U  in 1 year or sooner if needed   Butch Penny, MSN, NP-C 04/17/2022, 8:53 AM [facility] [address] [place], [place] [address] [phone]

## 2022-04-17 ENCOUNTER — Encounter: Payer: Self-pay | Admitting: Adult Health

## 2022-04-17 ENCOUNTER — Ambulatory Visit (INDEPENDENT_AMBULATORY_CARE_PROVIDER_SITE_OTHER): Payer: Medicare Other | Admitting: Adult Health

## 2022-04-17 VITALS — BP 124/81 | HR 83 | Ht 68.0 in | Wt 218.2 lb

## 2022-04-17 DIAGNOSIS — Z9989 Dependence on other enabling machines and devices: Secondary | ICD-10-CM

## 2022-04-17 DIAGNOSIS — G4733 Obstructive sleep apnea (adult) (pediatric): Secondary | ICD-10-CM

## 2022-04-24 ENCOUNTER — Ambulatory Visit (INDEPENDENT_AMBULATORY_CARE_PROVIDER_SITE_OTHER): Payer: Medicare Other

## 2022-04-24 DIAGNOSIS — J309 Allergic rhinitis, unspecified: Secondary | ICD-10-CM | POA: Diagnosis not present

## 2022-04-29 DIAGNOSIS — J3081 Allergic rhinitis due to animal (cat) (dog) hair and dander: Secondary | ICD-10-CM

## 2022-04-30 DIAGNOSIS — J3089 Other allergic rhinitis: Secondary | ICD-10-CM

## 2022-05-01 HISTORY — PX: EYE SURGERY: SHX253

## 2022-05-22 ENCOUNTER — Ambulatory Visit (INDEPENDENT_AMBULATORY_CARE_PROVIDER_SITE_OTHER): Payer: Medicare Other

## 2022-05-22 ENCOUNTER — Ambulatory Visit (INDEPENDENT_AMBULATORY_CARE_PROVIDER_SITE_OTHER): Payer: Medicare Other | Admitting: Adult Health

## 2022-05-22 ENCOUNTER — Encounter: Payer: Self-pay | Admitting: Adult Health

## 2022-05-22 VITALS — BP 124/85 | HR 86 | Ht 67.25 in | Wt 216.5 lb

## 2022-05-22 DIAGNOSIS — Z0000 Encounter for general adult medical examination without abnormal findings: Secondary | ICD-10-CM | POA: Diagnosis not present

## 2022-05-22 DIAGNOSIS — Z01419 Encounter for gynecological examination (general) (routine) without abnormal findings: Secondary | ICD-10-CM | POA: Insufficient documentation

## 2022-05-22 DIAGNOSIS — J309 Allergic rhinitis, unspecified: Secondary | ICD-10-CM

## 2022-05-22 DIAGNOSIS — Z1211 Encounter for screening for malignant neoplasm of colon: Secondary | ICD-10-CM

## 2022-05-22 LAB — HEMOCCULT GUIAC POC 1CARD (OFFICE): Fecal Occult Blood, POC: NEGATIVE

## 2022-05-22 NOTE — Progress Notes (Signed)
Patient ID: Brooke Farrell, female   DOB: Jan 04, 1956, 66 y.o.   MRN: [id number] History of Present Illness: Brooke Farrell is a 66 year old white female, married, PM in for a well woman gyn exam. She had a normal pap 04/04/21. She is a new pt. PCP is Dr Cyndia Bent.   Current Medications, Allergies, Past Medical History, Past Surgical History, Family History and Social History were reviewed in [employer] record.     Review of Systems: Patient denies any headaches, hearing loss, fatigue, blurred vision, shortness of breath, chest pain, abdominal pain, problems with bowel movements, urination, or intercourse. No joint pain or mood swings.  She denies any vaginal bleeding   Physical Exam:BP 124/85 (BP Location: Left Arm, Patient Position: Sitting, Cuff Size: Large)   Pulse 86   Ht 5' 7.25" (1.708 m)   Wt 216 lb 8 oz (98.2 kg)   BMI 33.66 kg/m   General:  Well developed, well nourished, no acute distress Skin:  Warm and dry Neck:  Midline trachea, normal thyroid, good ROM, no lymphadenopathy, no carotid bruits heard  Lungs; Clear to auscultation bilaterally Breast:  No dominant palpable mass, retraction, or nipple discharge,has bilateral implants(had since she was 19), Cardiovascular: Regular rate and rhythm Abdomen:  Soft, non tender, no hepatosplenomegaly Pelvic:  External genitalia is normal in appearance, no lesions.  The vagina is pale. Urethra has no lesions or masses. The cervix is smooth.  Uterus is felt to be normal size, shape, and contour.  No adnexal masses or tenderness noted.Bladder is non tender, no masses felt. Rectal: Good sphincter tone, no polyps, or hemorrhoids felt.  Hemoccult negative. Extremities/musculoskeletal:  No swelling or varicosities noted, no clubbing or cyanosis Psych:  No mood changes, alert and cooperative,seems happy AA is 1 Fall risk is low    05/22/2022   11:16 AM 07/20/2018    3:09 PM 06/02/2018   11:27 AM  Depression screen PHQ 2/9   Decreased Interest 1 0 1  Down, Depressed, Hopeless 0 0 3  PHQ - 2 Score 1 0 4  Altered sleeping 2  3  Tired, decreased energy 1  3  Change in appetite 0  3  Feeling bad or failure about yourself  1  0  Trouble concentrating 0  1  Moving slowly or fidgety/restless 0  0  Suicidal thoughts 0  0  PHQ-9 Score 5  14       05/22/2022   11:16 AM  GAD 7 : Generalized Anxiety Score  Nervous, Anxious, on Edge 1  Control/stop worrying 1  Worry too much - different things 1  Trouble relaxing 1  Restless 1  Easily annoyed or irritable 1  Afraid - awful might happen 1  Total GAD 7 Score 7      Upstream - 05/22/22 1113       Pregnancy Intention Screening   Does the patient want to become pregnant in the next year? N/A    Does the patient's partner want to become pregnant in the next year? N/A    Would the patient like to discuss contraceptive options today? N/A      Contraception Wrap Up   Current Method No Method - Other Reason   postmenopausal   End Method No Method - Other Reason   postmenopausal            Examination chaperoned by Malachy Mood LPN  Impression and Plan: 1. Encounter for well woman exam with routine gynecological exam GYN  physical in 2 years Pap in 2025 if desired Physical with PCP Labs with PCP Had mammogram 01/31/22 Had colonoscopy 04/26/17   2. Encounter for screening fecal occult blood testing Hemoccult was negative

## 2022-05-27 ENCOUNTER — Ambulatory Visit (INDEPENDENT_AMBULATORY_CARE_PROVIDER_SITE_OTHER): Payer: Medicare Other | Admitting: Podiatry

## 2022-05-27 DIAGNOSIS — B351 Tinea unguium: Secondary | ICD-10-CM | POA: Diagnosis not present

## 2022-05-27 MED ORDER — EFINACONAZOLE 10 % EX SOLN: 1.0000 [drp] | Freq: Every day | CUTANEOUS | 11 refills | Status: DC

## 2022-05-27 NOTE — Patient Instructions (Signed)
Soak Instructions    THE DAY AFTER THE PROCEDURE  Place 1/4 cup of epsom salts in a quart of warm tap water.  Submerge your foot or feet with outer bandage intact for the initial soak; this will allow the bandage to become moist and wet for easy lift off.  Once you remove your bandage, continue to soak in the solution for 20 minutes.  This soak should be done twice a day.  Next, remove your foot or feet from solution, blot dry the affected area and cover.  You may use a band aid large enough to cover the area or use gauze and tape.  Apply other medications to the area as directed by the doctor such as polysporin neosporin.  IF YOUR SKIN BECOMES IRRITATED WHILE USING THESE INSTRUCTIONS, IT IS OKAY TO SWITCH TO  WHITE VINEGAR AND WATER. Or you may use antibacterial soap and water to keep the toe clean  Monitor for any signs/symptoms of infection. Call the office immediately if any occur or go directly to the emergency room. Call with any questions/concerns.    Efinaconazole Topical Solution What is this medication? EFINACONAZOLE (e FEE na KON a zole) is an antifungal medicine. It is used to treat certain kinds of fungal infections of the toenail. This medicine may be used for other purposes; ask your health care provider or pharmacist if you have questions. COMMON BRAND NAME(S): JUBLIA What should I tell my care team before I take this medication? They need to know if you have any of these conditions: an unusual or allergic reaction to efinaconazole, other medicines, foods, dyes or preservatives pregnant or trying to get pregnant breast-feeding How should I use this medication? This medicine is for external use only. Do not take by mouth. Follow the directions on the label. Wash hands before and after use. Apply this medicine using the provided brush to cover the entire toenail. Do not use your medicine more often than directed. Finish the full course prescribed by your doctor or health care  professional even if you think your condition is better. Do not stop using except on the advice of your doctor or health care professional. Talk to your pediatrician regarding the use of this medicine in children. While this drug may be prescribed for children as young as 6 years for selected conditions, precautions do apply. Overdosage: If you think you have taken too much of this medicine contact a poison control center or emergency room at once. NOTE: This medicine is only for you. Do not share this medicine with others. What if I miss a dose? If you miss a dose, use it as soon as you can. If it is almost time for your next dose, use only that dose. Do not use double or extra doses. What may interact with this medication? Interactions have not been studied. Do not use any other nail products (i.e., nail polish, pedicures) during treatment with this medicine. This list may not describe all possible interactions. Give your health care provider a list of all the medicines, herbs, non-prescription drugs, or dietary supplements you use. Also tell them if you smoke, drink alcohol, or use illegal drugs. Some items may interact with your medicine. What should I watch for while using this medication? Do not get this medicine in your eyes. If you do, rinse out with plenty of cool tap water. Tell your doctor or health care professional if your symptoms do not start to get better or if they get worse. Wait   for at least 10 minutes after bathing before applying this medication. After bathing, make sure that your feet are very dry. Fungal infections like moist conditions. Do not walk around barefoot. To help prevent reinfection, wear freshly washed cotton, not synthetic clothing. Tell your doctor or health care professional if you develop sores or blisters that do not heal properly. If your nail infection returns after you stop using this medicine, contact your doctor or health care professional. What side effects  may I notice from receiving this medication? Side effects that you should report to your doctor or health care professional as soon as possible: allergic reactions like skin rash, itching or hives, swelling of the face, lips, or tongue ingrown toenail Side effects that usually do not require medical attention (report to your doctor or health care professional if they continue or are bothersome): mild skin irritation, burning, or itching This list may not describe all possible side effects. Call your doctor for medical advice about side effects. You may report side effects to FDA at 1-800-FDA-1088. Where should I keep my medication? Keep out of the reach of children. Store at room temperature between 20 and 25 degrees C (68 and 77 degrees F). Keep this medicine in the original container. Throw away any unused medicine after the expiration date. This medicine is flammable. Avoid exposure to heat, fire, flame, and smoking. NOTE: This sheet is a summary. It may not cover all possible information. If you have questions about this medicine, talk to your doctor, pharmacist, or health care provider.  2023 Elsevier/Gold Standard (2019-03-03 00:00:00) 

## 2022-05-27 NOTE — Progress Notes (Signed)
Subjective: Chief Complaint  Patient presents with   Nail Problem    Pt came in for bilateral nail fungus, pt is doing better, pt would like to try laser treatment     66 year old female presents the office today for follow-up evaluation of nail fungus. She states that she was doing well but she had eye surgery so she was not allowed to put on the medication as she could not bend over. She had surgery on 05/01/2022.he has not been able to get a new prescription for the nail fungus. She is now able to bend over to put the medication on it. She states that the jublia was helping.  Objective: AAO x3, NAD DP/PT pulses palpable bilaterally, CRT less than 3 seconds Nails about the same continue be hypertrophic, dystrophic with yellow, white discoloration and brittle.  No edema, erythema, drainage or pus or any signs of infection noted. She has pulled out a "hangnail" on the right hallux. Minimal edema and erythema without any drainage or pus.  No pain with calf compression, swelling, warmth, erythema  Assessment: Onychomycosis  Plan: -All treatment options discussed with the patient including all alternatives, risks, complications.  -Nails lightly debrided x 10 without any complaints or bleeding. There was not much to trim today but filed them down.  -Refilled Jublia -Monitor right hallux toenail for any signs of infection. Recommended epsom salts and antibiotic ointment. Recommended not to pull out a "hangnail".   Vivi Barrack DPM

## 2022-06-10 ENCOUNTER — Other Ambulatory Visit: Payer: Self-pay | Admitting: Radiology

## 2022-06-10 ENCOUNTER — Other Ambulatory Visit: Payer: Self-pay | Admitting: Family Medicine

## 2022-06-10 DIAGNOSIS — Z1231 Encounter for screening mammogram for malignant neoplasm of breast: Secondary | ICD-10-CM

## 2022-06-12 ENCOUNTER — Ambulatory Visit (INDEPENDENT_AMBULATORY_CARE_PROVIDER_SITE_OTHER): Payer: Medicare Other

## 2022-06-12 DIAGNOSIS — J309 Allergic rhinitis, unspecified: Secondary | ICD-10-CM

## 2022-06-13 ENCOUNTER — Encounter: Payer: Self-pay | Admitting: Allergy & Immunology

## 2022-07-01 NOTE — Telephone Encounter (Signed)
Please advise which shot this patient needs to take a rsv shot or a pneumonia shot?

## 2022-07-02 NOTE — Telephone Encounter (Signed)
I am not sure when we are getting the RSV or Prevnar-20 shots.   Morrie Sheldon, do you know? Maybe [facility] or [facility] is a better option for her.  Malachi Bonds, MD [facility]

## 2022-07-03 ENCOUNTER — Ambulatory Visit (INDEPENDENT_AMBULATORY_CARE_PROVIDER_SITE_OTHER): Payer: Medicare Other

## 2022-07-03 DIAGNOSIS — J309 Allergic rhinitis, unspecified: Secondary | ICD-10-CM | POA: Diagnosis not present

## 2022-07-03 NOTE — Telephone Encounter (Signed)
Patient came in person to ask which shot she should get. I spoke to Dr. Dellis Anes who recommended she should get both.   Advised patient Dr. Ellouise Newer response. Also advised patient to reach out to her PCP to see if they have them, as our office currently does not. Patient verbalized understanding.

## 2022-07-24 ENCOUNTER — Ambulatory Visit (INDEPENDENT_AMBULATORY_CARE_PROVIDER_SITE_OTHER): Payer: Medicare Other

## 2022-07-24 DIAGNOSIS — J309 Allergic rhinitis, unspecified: Secondary | ICD-10-CM | POA: Diagnosis not present

## 2022-08-23 ENCOUNTER — Ambulatory Visit (INDEPENDENT_AMBULATORY_CARE_PROVIDER_SITE_OTHER): Payer: Medicare Other

## 2022-08-23 DIAGNOSIS — J309 Allergic rhinitis, unspecified: Secondary | ICD-10-CM | POA: Diagnosis not present

## 2022-08-28 ENCOUNTER — Ambulatory Visit (INDEPENDENT_AMBULATORY_CARE_PROVIDER_SITE_OTHER): Payer: Medicare Other

## 2022-08-28 DIAGNOSIS — J309 Allergic rhinitis, unspecified: Secondary | ICD-10-CM | POA: Diagnosis not present

## 2022-09-04 ENCOUNTER — Ambulatory Visit (INDEPENDENT_AMBULATORY_CARE_PROVIDER_SITE_OTHER): Payer: Medicare Other | Admitting: *Deleted

## 2022-09-04 DIAGNOSIS — J309 Allergic rhinitis, unspecified: Secondary | ICD-10-CM

## 2022-09-05 ENCOUNTER — Ambulatory Visit (INDEPENDENT_AMBULATORY_CARE_PROVIDER_SITE_OTHER): Payer: Medicare Other | Admitting: Podiatry

## 2022-09-05 DIAGNOSIS — B351 Tinea unguium: Secondary | ICD-10-CM

## 2022-09-05 MED ORDER — EFINACONAZOLE 10 % EX SOLN: 1.0000 [drp] | Freq: Every day | CUTANEOUS | 11 refills | Status: AC

## 2022-09-05 NOTE — Progress Notes (Signed)
Subjective: Chief Complaint  Patient presents with   Nail Problem    Bilateral nail Fungus,  Patient states no change, Patient denies any pain  TX: jublia      66 year old female presents the office today for follow-up evaluation of nail fungus.  She states that she has been using topical medication but she is not sure if its been helping.  No swelling redness or drainage to the toenail sites.  She has a pulled off a piece of nail the right third toenail.  No drainage or pus that she reports.    Objective: AAO x3, NAD DP/PT pulses palpable bilaterally, CRT less than 3 seconds Nails about the same continue be hypertrophic, dystrophic with yellow discoloration and brittle.  No edema, erythema, drainage or pus or any signs of infection noted.  On the right third toenail where she pulled a piece of nail out is mildly edematous and mild erythema without any drainage or pus.  No drainage or pus.  No fluctuance or crepitation. No pain with calf compression, swelling, warmth, erythema  Assessment: Onychomycosis  Plan: -All treatment options discussed with the patient including all alternatives, risks, complications.  -Nails lightly debrided x 10 without any complaints or bleeding. There was not much to trim today but filed them down.  -Refilled Jublia -Recommend not trying to remove the corners of the nails herself.  She is to monitor this for any signs or symptoms of infection.  Recommend Epsom salt soaks as needed as well as antibiotic ointment.  Trula Slade DPM

## 2022-09-05 NOTE — Patient Instructions (Signed)
Efinaconazole Topical Solution What is this medication? EFINACONAZOLE (e FEE na KON a zole) treats fungal infections of the nails. It belongs to a group of medications called antifungals. It will not treat infections caused by bacteria or viruses. This medicine may be used for other purposes; ask your health care provider or pharmacist if you have questions. COMMON BRAND NAME(S): JUBLIA What should I tell my care team before I take this medication? They need to know if you have any of these conditions: An unusual or allergic reaction to efinaconazole, other medications, foods, dyes or preservatives Pregnant or trying to get pregnant Breast-feeding How should I use this medication? This medication is for external use only. Do not take by mouth. Wash your hands before and after use. Do not get it in your eyes. If you do, rinse your eyes with plenty of cool tap water. Use it as directed on the prescription label. Do not use it more often than directed. Use the medication for the full course as directed by your care team, even if you think you are better. Do not stop using it unless your care team tells you to stop it early. This medication comes with INSTRUCTIONS FOR USE. Ask your pharmacist for directions on how to use this medication. Read the information carefully. Talk to your pharmacist or care team if you have questions. Talk to your care team about the use of this medication in children. While it may be prescribed for children as young as 6 years for selected conditions, precautions do apply. Overdosage: If you think you have taken too much of this medicine contact a poison control center or emergency room at once. NOTE: This medicine is only for you. Do not share this medicine with others. What if I miss a dose? If you miss a dose, use it as soon as you can. If it is almost time for your next dose, use only that dose. Do not use double or extra doses. What may interact with this  medication? Interactions are not expected. Do not use any other skin products on the same area of skin without talking to your care team. This list may not describe all possible interactions. Give your health care provider a list of all the medicines, herbs, non-prescription drugs, or dietary supplements you use. Also tell them if you smoke, drink alcohol, or use illegal drugs. Some items may interact with your medicine. What should I watch for while using this medication? Visit your care team for regular checks on your progress. It may be some time before you see the benefit from this medication. Do not use nail polish or other nail cosmetic products on the treated nails. What side effects may I notice from receiving this medication? Side effects that you should report to your care team as soon as possible: Allergic reactions--skin rash, itching, hives, swelling of the face, lips, tongue, or throat Side effects that usually do not require medical attention (report to your care team if they continue or are bothersome): Ingrown nails Mild skin irritation, redness, or dryness This list may not describe all possible side effects. Call your doctor for medical advice about side effects. You may report side effects to FDA at 1-800-FDA-1088. Where should I keep my medication? Keep out of the reach of children and pets. Store at room temperature between 20 and 25 degrees C (68 and 77 degrees F). Do not freeze. Keep the container tightly closed. Get rid of any unused medication after the expiration date.   This medication is flammable. Avoid exposure to heat, flame, and smoking. To get rid of medications that are no longer needed or have expired: Take the medication to a medication take-back program. Check with your pharmacy or law enforcement to find a location. If you cannot return the medication, ask your pharmacist or care team how to get rid of this medication safely. NOTE: This sheet is a summary. It  may not cover all possible information. If you have questions about this medicine, talk to your doctor, pharmacist, or health care provider.  2023 Elsevier/Gold Standard (2021-12-30 00:00:00)  

## 2022-09-11 ENCOUNTER — Ambulatory Visit (INDEPENDENT_AMBULATORY_CARE_PROVIDER_SITE_OTHER): Payer: Medicare Other

## 2022-09-11 DIAGNOSIS — J309 Allergic rhinitis, unspecified: Secondary | ICD-10-CM | POA: Diagnosis not present

## 2022-09-12 ENCOUNTER — Other Ambulatory Visit: Payer: Self-pay | Admitting: Podiatry

## 2022-09-12 MED ORDER — TAVABOROLE 5 % EX SOLN: 1.0000 [drp] | Freq: Every day | CUTANEOUS | 2 refills | Status: DC

## 2022-09-12 NOTE — Progress Notes (Signed)
Sent kerydin to pharmacy in place of [place] as alternative was requested by pharmacy due to cost.

## 2022-09-18 ENCOUNTER — Ambulatory Visit (INDEPENDENT_AMBULATORY_CARE_PROVIDER_SITE_OTHER): Payer: Medicare Other

## 2022-09-18 DIAGNOSIS — J309 Allergic rhinitis, unspecified: Secondary | ICD-10-CM | POA: Diagnosis not present

## 2022-10-01 LAB — HEMOGLOBIN A1C: Hemoglobin A1C: 6.5

## 2022-10-09 ENCOUNTER — Ambulatory Visit (INDEPENDENT_AMBULATORY_CARE_PROVIDER_SITE_OTHER): Payer: Medicare Other

## 2022-10-09 DIAGNOSIS — J309 Allergic rhinitis, unspecified: Secondary | ICD-10-CM

## 2022-11-06 ENCOUNTER — Ambulatory Visit (INDEPENDENT_AMBULATORY_CARE_PROVIDER_SITE_OTHER): Payer: Medicare Other

## 2022-11-06 DIAGNOSIS — J309 Allergic rhinitis, unspecified: Secondary | ICD-10-CM

## 2022-11-07 ENCOUNTER — Other Ambulatory Visit: Payer: Medicare Other

## 2022-11-19 ENCOUNTER — Ambulatory Visit (INDEPENDENT_AMBULATORY_CARE_PROVIDER_SITE_OTHER): Payer: Medicare Other | Admitting: Family Medicine

## 2022-11-19 ENCOUNTER — Encounter: Payer: Self-pay | Admitting: Family Medicine

## 2022-11-19 VITALS — BP 117/80 | HR 89 | Temp 96.9°F | Ht 67.25 in | Wt 213.0 lb

## 2022-11-19 DIAGNOSIS — F419 Anxiety disorder, unspecified: Secondary | ICD-10-CM | POA: Diagnosis not present

## 2022-11-19 DIAGNOSIS — K580 Irritable bowel syndrome with diarrhea: Secondary | ICD-10-CM | POA: Diagnosis not present

## 2022-11-19 DIAGNOSIS — F32A Depression, unspecified: Secondary | ICD-10-CM

## 2022-11-19 DIAGNOSIS — E119 Type 2 diabetes mellitus without complications: Secondary | ICD-10-CM | POA: Diagnosis not present

## 2022-11-19 MED ORDER — DIPHENOXYLATE-ATROPINE 2.5-0.025 MG PO TABS
1.0000 | ORAL_TABLET | Freq: Four times a day (QID) | ORAL | 0 refills | Status: DC | PRN
Start: 2022-11-19 — End: 2023-02-12

## 2022-11-19 NOTE — Assessment & Plan Note (Addendum)
IBS-D.  Lomotil was prescribed.  Patient has failed other pharmacotherapeutic's.  If this does not help we will proceed with rifaximin and local GI referral.  Advise low FODMAP diet.

## 2022-11-19 NOTE — Progress Notes (Signed)
Subjective:  Patient ID: Brooke Farrell, female    DOB: 1956-06-19  Age: 67 y.o. MRN: [id number]  CC: Chief Complaint  Patient presents with   Establish Care    HPI:  67 year old female presents to establish care.  Patient's primary concern today is chronic diarrhea.  Patient has IBS-D.  She has been seen by multiple GI specialists including [facility] and [facility].  Per the EMR, she has used colestipol and Viberzi previously.  She has had colonoscopy and endoscopies.  She states that she continues to have severe diarrhea.  She is not currently on any pharmacotherapy regarding this.  Patient suffers from chronic anxiety and depression.  She states that she is currently on Prozac.  She has been on this for many years.  She is currently taking this every 3 days or so.  She states that it seems to make her agitated.  She would like to discuss decreasing or potentially discontinuing.  Patient has underlying type 2 diabetes.  Currently untreated and managed with diet alone.  Last A1c 6.5.  This is available in care everywhere.  Patient Active Problem List   Diagnosis Date Noted   Type 2 diabetes mellitus without complications ([place]) 81/82/9937   Anxiety and depression 11/19/2022   Primary insomnia 09/14/2019   OSA on CPAP 09/10/2018   Seasonal and perennial allergic rhinitis 08/26/2018   Mild intermittent asthma, uncomplicated 16/96/7893   IBS (irritable bowel syndrome) 03/02/2018   Gastroesophageal reflux disease with esophagitis 01/16/2015   H/O bilateral breast implants 01/21/2013   HPV (human papilloma virus) anogenital infection 05/25/2012    Social Hx   Social History   Socioeconomic History   Marital status: Married    Spouse name: Not on file   Number of children: Not on file   Years of education: Not on file   Highest education level: Not on file  Occupational History   Not on file  Tobacco Use   Smoking status: Never   Smokeless tobacco: Never  Vaping Use   Vaping  Use: Never used  Substance and Sexual Activity   Alcohol use: Yes    Alcohol/week: 0.0 standard drinks of alcohol    Comment: RARE   Drug use: No   Sexual activity: Not Currently    Partners: Male    Birth control/protection: Post-menopausal    Comment: 1st intercourse- 16, partners- 35, married- 34 yrs  Other Topics Concern   Not on file  Social History Narrative   Not on file   Social Determinants of Health   Financial Resource Strain: Low Risk  (05/22/2022)   Overall Financial Resource Strain (CARDIA)    Difficulty of Paying Living Expenses: Not very hard  Food Insecurity: No Food Insecurity (05/22/2022)   Hunger Vital Sign    Worried About Running Out of Food in the Last Year: Never true    [place] in the Last Year: Never true  Transportation Needs: No Transportation Needs (05/22/2022)   PRAPARE - Hydrologist (Medical): No    Lack of Transportation (Non-Medical): No  Physical Activity: Insufficiently Active (05/22/2022)   Exercise Vital Sign    Days of Exercise per Week: 4 days    Minutes of Exercise per Session: 20 min  Stress: Stress Concern Present (05/22/2022)   [place]    Feeling of Stress : Very much  Social Connections: Socially Integrated (05/22/2022)   Social Connection and Isolation Panel [  NHANES]    Frequency of Communication with Friends and Family: More than three times a week    Frequency of Social Gatherings with Friends and Family: Once a week    Attends Religious Services: More than 4 times per year    Active Member of [employer] or Organizations: Yes    Attends Music therapist: More than 4 times per year    Marital Status: Married    Review of Systems Per HPI  Objective:  BP 117/80   Pulse 89   Temp (!) 96.9 F (36.1 C)   Ht 5' 7.25" (1.708 m)   Wt 213 lb (96.6 kg)   SpO2 96%   BMI 33.11 kg/m      11/19/2022   10:07 AM 05/22/2022    10:59 AM 04/17/2022    8:33 AM  BP/Weight  Systolic BP 836 629 476  Diastolic BP 80 85 81  Wt. (Lbs) 213 216.5 218.2  BMI 33.11 kg/m2 33.66 kg/m2 33.18 kg/m2    Physical Exam Vitals and nursing note reviewed.  Constitutional:      Appearance: Normal appearance. She is obese.  HENT:     Head: Normocephalic and atraumatic.  Cardiovascular:     Rate and Rhythm: Normal rate and regular rhythm.  Pulmonary:     Effort: Pulmonary effort is normal.     Breath sounds: Normal breath sounds. No wheezing, rhonchi or rales.  Abdominal:     General: There is no distension.     Palpations: Abdomen is soft.     Tenderness: There is no abdominal tenderness.  Neurological:     Mental Status: She is alert.  Psychiatric:     Comments: Anxious.     No results found for: "WBC", "HGB", "HCT", "PLT", "GLUCOSE", "CHOL", "TRIG", "HDL", "LDLDIRECT", "[place]", "ALT", "AST", "NA", "K", "CL", "CREATININE", "BUN", "CO2", "TSH", "PSA", "INR", "GLUF", "HGBA1C", "MICROALBUR"   Assessment & Plan:   Problem List Items Addressed This Visit       Digestive   IBS (irritable bowel syndrome)    IBS-D.  Lomotil was prescribed.  Patient has failed other pharmacotherapeutic's.  If this does not help we will proceed with rifaximin and local GI referral.  Advise low FODMAP diet.      Relevant Medications   diphenoxylate-atropine (LOMOTIL) 2.5-0.025 MG tablet     Endocrine   Type 2 diabetes mellitus without complications (HCC) - Primary    Stable on diet control.  Will continue to monitor closely.        Other   Anxiety and depression    Due to side effects, discontinuing Prozac.       Meds ordered this encounter  Medications   diphenoxylate-atropine (LOMOTIL) 2.5-0.025 MG tablet    Sig: Take 1 tablet by mouth 4 (four) times daily as needed for diarrhea or loose stools.    Dispense:  60 tablet    Refill:  0    Follow-up:  Return in about 3 months (around 02/18/2023).  [place]

## 2022-11-19 NOTE — Patient Instructions (Signed)
Watch diet.  Medication as directed.  Follow up in 3 months.

## 2022-11-19 NOTE — Assessment & Plan Note (Signed)
Due to side effects, discontinuing Prozac.

## 2022-11-19 NOTE — Assessment & Plan Note (Signed)
Stable on diet control.  Will continue to monitor closely.

## 2022-12-02 DIAGNOSIS — M545 Low back pain, unspecified: Secondary | ICD-10-CM | POA: Insufficient documentation

## 2022-12-02 DIAGNOSIS — J3081 Allergic rhinitis due to animal (cat) (dog) hair and dander: Secondary | ICD-10-CM | POA: Diagnosis not present

## 2022-12-03 DIAGNOSIS — J3089 Other allergic rhinitis: Secondary | ICD-10-CM | POA: Diagnosis not present

## 2022-12-03 NOTE — Progress Notes (Signed)
VIALS EXP 12-04-23 

## 2022-12-06 ENCOUNTER — Ambulatory Visit (INDEPENDENT_AMBULATORY_CARE_PROVIDER_SITE_OTHER): Payer: Medicare Other

## 2022-12-06 DIAGNOSIS — J309 Allergic rhinitis, unspecified: Secondary | ICD-10-CM | POA: Diagnosis not present

## 2022-12-12 ENCOUNTER — Ambulatory Visit (INDEPENDENT_AMBULATORY_CARE_PROVIDER_SITE_OTHER): Payer: Medicare Other | Admitting: Internal Medicine

## 2022-12-12 ENCOUNTER — Encounter: Payer: Self-pay | Admitting: Internal Medicine

## 2022-12-12 VITALS — BP 131/79 | HR 82 | Ht 68.5 in | Wt 212.8 lb

## 2022-12-12 DIAGNOSIS — J4520 Mild intermittent asthma, uncomplicated: Secondary | ICD-10-CM

## 2022-12-12 DIAGNOSIS — K580 Irritable bowel syndrome with diarrhea: Secondary | ICD-10-CM

## 2022-12-12 DIAGNOSIS — F5101 Primary insomnia: Secondary | ICD-10-CM

## 2022-12-12 DIAGNOSIS — F419 Anxiety disorder, unspecified: Secondary | ICD-10-CM | POA: Diagnosis not present

## 2022-12-12 DIAGNOSIS — E119 Type 2 diabetes mellitus without complications: Secondary | ICD-10-CM | POA: Diagnosis not present

## 2022-12-12 DIAGNOSIS — G4733 Obstructive sleep apnea (adult) (pediatric): Secondary | ICD-10-CM

## 2022-12-12 DIAGNOSIS — Z0001 Encounter for general adult medical examination with abnormal findings: Secondary | ICD-10-CM

## 2022-12-12 DIAGNOSIS — K2100 Gastro-esophageal reflux disease with esophagitis, without bleeding: Secondary | ICD-10-CM

## 2022-12-12 DIAGNOSIS — F32A Depression, unspecified: Secondary | ICD-10-CM

## 2022-12-12 HISTORY — DX: Encounter for general adult medical examination with abnormal findings: Z00.01

## 2022-12-12 MED ORDER — FLUOXETINE HCL 20 MG PO TABS: 20.0000 mg | ORAL_TABLET | Freq: Every day | ORAL | 2 refills | Status: DC

## 2022-12-12 NOTE — Progress Notes (Signed)
New Patient Office Visit  Subjective    Patient ID: Brooke Farrell, female    DOB: 1956-07-23  Age: 67 y.o. MRN: [id number]  CC:  Chief Complaint  Patient presents with   Establish Care   HPI Brooke Farrell presents to establish care.  She is a 67 year old woman with medical history significant for type 2 diabetes mellitus, anxiety/depression, IBS-D, OSA on CPAP, asthma, insomnia, and GERD.  She was most recently seen a new patient presenting to establish care at [facility] 1/16 (Dr. Lacinda Axon) but has previously received care at [facility] (Dr. Melford Aase).  Brooke Farrell reports feeling well today.  She is asymptomatic has 2 concerns she would like to discuss.  She is currently prescribed Lomotil IBS-D.  This was most recently prescribed on 1/16.  She feels that she is benefiting from the medication and wonders if the dose needs to be adjusted.  She additionally reports that fluoxetine was discontinued last month due to side effects of hyperactivity.  Since discontinuing the medication she believes her depression/anxiety has been worse.  She is interested in resuming fluoxetine at a lower dose.  Acute concerns, chronic medical conditions, and outside preventive care items discussed today are individually addressed in A/P below.  Outpatient Encounter Medications as of 12/12/2022  Medication Sig   albuterol (VENTOLIN HFA) 108 (90 Base) MCG/ACT inhaler Inhale 2 puffs into the lungs every 4 (four) hours as needed for wheezing or shortness of breath.   azelastine (ASTELIN) 0.1 % nasal spray Place 1 spray into both nostrils daily. Use in each nostril as directed   cetirizine (ZYRTEC) 10 MG tablet Take 10 mg by mouth daily as needed.    clonazePAM (KLONOPIN) 0.5 MG tablet Take 0.5 mg by mouth 2 (two) times daily as needed for anxiety.   cyclobenzaprine (FLEXERIL) 10 MG tablet Take 10 mg by mouth at bedtime.   diphenoxylate-atropine (LOMOTIL) 2.5-0.025 MG tablet Take 1 tablet by mouth 4  (four) times daily as needed for diarrhea or loose stools.   Efinaconazole 10 % SOLN Apply 1 drop topically daily.   FLUoxetine (PROZAC) 20 MG tablet Take 1 tablet (20 mg total) by mouth daily.   montelukast (SINGULAIR) 10 MG tablet Take 1 tablet (10 mg total) by mouth daily as needed. (Patient taking differently: Take 10 mg by mouth at bedtime.)   pantoprazole (PROTONIX) 40 MG tablet Take 1 tablet (40 mg total) by mouth daily.   Tavaborole (KERYDIN) 5 % SOLN Apply 1 drop topically daily. Apply 1 drop to the toenail daily.   zolpidem (AMBIEN) 10 MG tablet Take 10 mg by mouth at bedtime as needed.   [DISCONTINUED] FLUoxetine (PROZAC) 40 MG capsule Take 40 mg by mouth daily.   [DISCONTINUED] MOBIC 7.5 MG tablet Take 7.5 mg by mouth daily.   [facility]  Anti-fungal (nail)-#1 (Patient not taking: Reported on 05/22/2022)   No facility-administered encounter medications on file as of 12/12/2022.    Past Medical History:  Diagnosis Date   Acid reflux    Allergy    Anxiety    Asthma    Cataract    Depression    Encounter for general adult medical examination with abnormal findings 12/12/2022   Sleep apnea     Past Surgical History:  Procedure Laterality Date   AUGMENTATION MAMMAPLASTY  1985   CESAREAN SECTION     EYE SURGERY Bilateral 05/01/2022   eyelid surgery   JOINT REPLACEMENT     NO  PAST SURGERIES     TOTAL HIP ARTHROPLASTY  01/2020   dr. Despina Hick    Family History  Problem Relation Age of Onset   Cancer Father        bone   Diabetes Father    Hypertension Father    Heart attack Father    Breast cancer Mother    Kidney disease Mother    Varicose Veins Mother    Diverticulitis Sister    Allergic rhinitis Neg Hx    Angioedema Neg Hx    Asthma Neg Hx    Atopy Neg Hx    Eczema Neg Hx    Immunodeficiency Neg Hx    Urticaria Neg Hx    Sleep apnea Neg Hx     Social History   Socioeconomic History   Marital status: Married     Spouse name: Not on file   Number of children: Not on file   Years of education: Not on file   Highest education level: Not on file  Occupational History   Not on file  Tobacco Use   Smoking status: Never   Smokeless tobacco: Never  Vaping Use   Vaping Use: Never used  Substance and Sexual Activity   Alcohol use: Yes    Comment: two drinks a month   Drug use: No   Sexual activity: Not Currently    Partners: Male    Birth control/protection: Post-menopausal    Comment: 1st intercourse- 16, partners- 6, married- 25 yrs  Other Topics Concern   Not on file  Social History Narrative   Not on file   Social Determinants of Health   Financial Resource Strain: Low Risk  (05/22/2022)   Overall Financial Resource Strain (CARDIA)    Difficulty of Paying Living Expenses: Not very hard  Food Insecurity: No Food Insecurity (05/22/2022)   Hunger Vital Sign    Worried About Running Out of Food in the Last Year: Never true    Ran Out of Food in the Last Year: Never true  Transportation Needs: No Transportation Needs (05/22/2022)   PRAPARE - Administrator, Civil Service (Medical): No    Lack of Transportation (Non-Medical): No  Physical Activity: Insufficiently Active (05/22/2022)   Exercise Vital Sign    Days of Exercise per Week: 4 days    Minutes of Exercise per Session: 20 min  Stress: Stress Concern Present (05/22/2022)   [employer] of Occupational Health - Occupational Stress Questionnaire    Feeling of Stress : Very much  Social Connections: Socially Integrated (05/22/2022)   Social Connection and Isolation Panel [NHANES]    Frequency of Communication with Friends and Family: More than three times a week    Frequency of Social Gatherings with Friends and Family: Once a week    Attends Religious Services: More than 4 times per year    Active Member of [employer] or Organizations: Yes    Attends Engineer, structural: More than 4 times per year    Marital Status:  Married  Catering manager Violence: Not At Risk (05/22/2022)   Humiliation, Afraid, Rape, and Kick questionnaire    Fear of Current or Ex-Partner: No    Emotionally Abused: No    Physically Abused: No    Sexually Abused: No   Review of Systems  Constitutional:  Negative for chills and fever.  HENT:  Negative for sore throat.   Respiratory:  Negative for cough and shortness of breath.   Cardiovascular:  Negative for chest  pain, palpitations and leg swelling.  Gastrointestinal:  Negative for abdominal pain, blood in stool, constipation, diarrhea, nausea and vomiting.  Genitourinary:  Negative for dysuria and hematuria.  Musculoskeletal:  Negative for myalgias.  Skin:  Negative for itching and rash.  Neurological:  Negative for dizziness and headaches.  Psychiatric/Behavioral:  Negative for depression and suicidal ideas.    Objective    BP 131/79   Pulse 82   Ht 5' 8.5" (1.74 m)   Wt 212 lb 12.8 oz (96.5 kg)   SpO2 94%   BMI 31.89 kg/m   Physical Exam Vitals reviewed.  Constitutional:      General: She is not in acute distress.    Appearance: Normal appearance. She is obese. She is not toxic-appearing.  HENT:     Head: Normocephalic and atraumatic.     Right Ear: External ear normal.     Left Ear: External ear normal.     Nose: Nose normal. No congestion or rhinorrhea.     Mouth/Throat:     Mouth: Mucous membranes are moist.     Pharynx: Oropharynx is clear. No oropharyngeal exudate or posterior oropharyngeal erythema.  Eyes:     General: No scleral icterus.    Extraocular Movements: Extraocular movements intact.     Conjunctiva/sclera: Conjunctivae normal.     Pupils: Pupils are equal, round, and reactive to light.  Cardiovascular:     Rate and Rhythm: Normal rate and regular rhythm.     Pulses: Normal pulses.     Heart sounds: Normal heart sounds. No murmur heard.    No friction rub. No gallop.  Pulmonary:     Effort: Pulmonary effort is normal.     Breath sounds:  Normal breath sounds. No wheezing, rhonchi or rales.  Abdominal:     General: Abdomen is flat. Bowel sounds are normal. There is no distension.     Palpations: Abdomen is soft.     Tenderness: There is no abdominal tenderness.  Musculoskeletal:        General: No swelling. Normal range of motion.     Cervical back: Normal range of motion.     Right lower leg: No edema.     Left lower leg: No edema.  Lymphadenopathy:     Cervical: No cervical adenopathy.  Skin:    General: Skin is warm and dry.     Capillary Refill: Capillary refill takes less than 2 seconds.     Coloration: Skin is not jaundiced.  Neurological:     General: No focal deficit present.     Mental Status: She is alert and oriented to person, place, and time.  Psychiatric:        Mood and Affect: Mood normal.        Behavior: Behavior normal.    Assessment & Plan:   Problem List Items Addressed This Visit       Mild intermittent asthma, uncomplicated    Asymptomatic currently.  Unremarkable pulmonary exam today.  Has an albuterol inhaler for as needed use but states that she has not needed to use her inhaler recently.      OSA on CPAP    History of OSA.  Compliant with CPAP.  This is managed by [facility].      Gastroesophageal reflux disease with esophagitis    Symptoms are currently well-controlled with Protonix 40 mg daily.  No changes today.      IBS (irritable bowel syndrome)    IBS-D.  Recently prescribed  Lomotil, which she states has been helpful.  She is in agreement with continuing her current dose until follow-up in 3 months.  If her symptoms do not improve she is in agreement with a referral to gastroenterology for further evaluation and management.      Type 2 diabetes mellitus without complications (HCC)    Recent diagnosis.  A1c 6.5 in March 2023 and again in November 2023.  Diet controlled.  States that she was unaware of this diagnosis. -No medication changes for now.   Plan for repeat labs in 3 months. Will address outstanding diabetes related preventative care items at that time as well.      Primary insomnia    Currently prescribed Ambien 10 mg nightly for treatment of insomnia.  She reports that she only takes half a tablet. -I reviewed the FDA guidelines with Ms. Bailon, which recommended maximum dose of 5 mg for women.  Given that she only takes half a tablet, I will prescribe Ambien 5 mg nightly when she is in need of a refill      Anxiety and depression - Primary    She recently attempted to discontinue Prozac 40 mg due to side effects of hyperactivity, however she has experienced more anxiety and emotional lability since stopping the medication.  She is interested in resuming Prozac today at a lower dose. -Resume Prozac at 20 mg daily -Through shared decision making, Klonopin has been discontinued.  She states that this was prescribed for as needed use and that she has not needed to take Klonopin recently.      Encounter for general adult medical examination with abnormal findings    Presenting today to establish care.  Previous records and labs been reviewed. -Labs from November 2023 reviewed -Vaccines are up-to-date -Plan for follow-up in 3 months for repeat labs and to address outstanding preventative care items       Return in about 3 months (around 03/12/2023).   Johnette Abraham, MD

## 2022-12-12 NOTE — Assessment & Plan Note (Signed)
Symptoms are currently well-controlled with Protonix 40 mg daily.  No changes today. 

## 2022-12-12 NOTE — Assessment & Plan Note (Signed)
She recently attempted to discontinue Prozac 40 mg due to side effects of hyperactivity, however she has experienced more anxiety and emotional lability since stopping the medication.  She is interested in resuming Prozac today at a lower dose. -Resume Prozac at 20 mg daily -Through shared decision making, Klonopin has been discontinued.  She states that this was prescribed for as needed use and that she has not needed to take Klonopin recently.

## 2022-12-12 NOTE — Assessment & Plan Note (Addendum)
IBS-D.  Recently prescribed Lomotil, which she states has been helpful.  She is in agreement with continuing her current dose until follow-up in 3 months.  If her symptoms do not improve she is in agreement with a referral to gastroenterology for further evaluation and management.

## 2022-12-12 NOTE — Assessment & Plan Note (Signed)
Presenting today to establish care.  Previous records and labs been reviewed. -Labs from November 2023 reviewed -Vaccines are up-to-date -Plan for follow-up in 3 months for repeat labs and to address outstanding preventative care items

## 2022-12-12 NOTE — Assessment & Plan Note (Addendum)
History of OSA.  Compliant with CPAP.  This is managed by [facility].

## 2022-12-12 NOTE — Assessment & Plan Note (Signed)
Asymptomatic currently.  Unremarkable pulmonary exam today.  Has an albuterol inhaler for as needed use but states that she has not needed to use her inhaler recently.

## 2022-12-12 NOTE — Assessment & Plan Note (Signed)
Currently prescribed Ambien 10 mg nightly for treatment of insomnia.  She reports that she only takes half a tablet. -I reviewed the FDA guidelines with Brooke Farrell, which recommended maximum dose of 5 mg for women.  Given that she only takes half a tablet, I will prescribe Ambien 5 mg nightly when she is in need of a refill

## 2022-12-12 NOTE — Patient Instructions (Signed)
It was a pleasure to see you today.  Thank you for giving [place] the opportunity to be involved in your care.  Below is a brief recap of your visit and next steps.  We will plan to see you again in 3 months.  Summary You have established care today We will plan for follow up and repeat labs in 3 months Decrease Prozac to 20 mg daily

## 2022-12-12 NOTE — Assessment & Plan Note (Signed)
Recent diagnosis.  A1c 6.5 in March 2023 and again in November 2023.  Diet controlled.  States that she was unaware of this diagnosis. -No medication changes for now.  Plan for repeat labs in 3 months. Will address outstanding diabetes related preventative care items at that time as well.

## 2023-01-01 ENCOUNTER — Ambulatory Visit (INDEPENDENT_AMBULATORY_CARE_PROVIDER_SITE_OTHER): Payer: Medicare Other

## 2023-01-01 DIAGNOSIS — J309 Allergic rhinitis, unspecified: Secondary | ICD-10-CM

## 2023-01-29 ENCOUNTER — Ambulatory Visit (INDEPENDENT_AMBULATORY_CARE_PROVIDER_SITE_OTHER): Payer: Medicare Other

## 2023-01-29 DIAGNOSIS — J309 Allergic rhinitis, unspecified: Secondary | ICD-10-CM

## 2023-02-05 LAB — HM MAMMOGRAPHY

## 2023-02-10 LAB — HM MAMMOGRAPHY

## 2023-02-11 ENCOUNTER — Other Ambulatory Visit: Payer: Medicare Other

## 2023-02-11 DIAGNOSIS — B351 Tinea unguium: Secondary | ICD-10-CM

## 2023-02-12 ENCOUNTER — Other Ambulatory Visit: Payer: Self-pay | Admitting: Internal Medicine

## 2023-02-12 ENCOUNTER — Ambulatory Visit: Payer: Medicare Other

## 2023-02-12 DIAGNOSIS — B351 Tinea unguium: Secondary | ICD-10-CM

## 2023-02-12 NOTE — Patient Instructions (Signed)

## 2023-02-12 NOTE — Progress Notes (Signed)
Patient presents today for the first laser treatment. Diagnosed with mycotic nail infection by Dr. Ardelle Anton .   Toenail most affected 1-5 bilaterally .  All other systems are negative.  Nails were filed thin. Laser therapy was administered to 1-5 toenails bilaterally  and patient tolerated the treatment well. All safety precautions were in place.   Single laser pass was done on non-affected nails.   Follow up in 4 weeks for laser # 2.  Picture of nails taken today to document visual progress

## 2023-02-18 ENCOUNTER — Encounter: Payer: Self-pay | Admitting: Internal Medicine

## 2023-02-18 ENCOUNTER — Ambulatory Visit: Payer: Medicare Other | Admitting: Family Medicine

## 2023-02-19 ENCOUNTER — Other Ambulatory Visit: Payer: Self-pay

## 2023-02-19 ENCOUNTER — Ambulatory Visit (INDEPENDENT_AMBULATORY_CARE_PROVIDER_SITE_OTHER): Payer: Medicare Other | Admitting: Allergy & Immunology

## 2023-02-19 ENCOUNTER — Encounter: Payer: Self-pay | Admitting: Allergy & Immunology

## 2023-02-19 VITALS — BP 138/72 | HR 100 | Temp 98.0°F | Resp 20 | Ht 68.0 in | Wt 213.0 lb

## 2023-02-19 DIAGNOSIS — K219 Gastro-esophageal reflux disease without esophagitis: Secondary | ICD-10-CM | POA: Diagnosis not present

## 2023-02-19 DIAGNOSIS — J309 Allergic rhinitis, unspecified: Secondary | ICD-10-CM | POA: Diagnosis not present

## 2023-02-19 DIAGNOSIS — J4540 Moderate persistent asthma, uncomplicated: Secondary | ICD-10-CM

## 2023-02-19 DIAGNOSIS — J302 Other seasonal allergic rhinitis: Secondary | ICD-10-CM

## 2023-02-19 MED ORDER — ALBUTEROL SULFATE HFA 108 (90 BASE) MCG/ACT IN AERS: 2.0000 | INHALATION_SPRAY | RESPIRATORY_TRACT | 1 refills | Status: AC | PRN

## 2023-02-19 MED ORDER — FLUTICASONE PROPIONATE 50 MCG/ACT NA SUSP: 2.0000 | Freq: Every day | NASAL | 3 refills | Status: AC

## 2023-02-19 MED ORDER — MONTELUKAST SODIUM 10 MG PO TABS: 10.0000 mg | ORAL_TABLET | Freq: Every day | ORAL | 3 refills | Status: DC

## 2023-02-19 MED ORDER — AZELASTINE HCL 0.1 % NA SOLN: 1.0000 | Freq: Every day | NASAL | 3 refills | Status: DC

## 2023-02-19 MED ORDER — CETIRIZINE HCL 10 MG PO TABS: 10.0000 mg | ORAL_TABLET | Freq: Every day | ORAL | 3 refills | Status: DC | PRN

## 2023-02-19 MED ORDER — PREDNISONE 10 MG PO TABS: ORAL_TABLET | ORAL | 0 refills | Status: DC

## 2023-02-19 NOTE — Progress Notes (Signed)
FOLLOW UP  Date of Service/Encounter:  02/19/23   Assessment:   Seasonal and perennial allergic rhinitis (grasses, weeds, ragweed, trees, indoor mold, dust mites, cats, dogs, roach) - with worsening symptoms during this time of the year   Maintenance reached December 2020 on current vials   Mild intermittent asthma, uncomplicated   Viral URI - COVID testing pending   Fully vaccinated to COVID-19  Plan/Recommendations:   1. Seasonal and perennial allergic rhinitis - Continue with allergy shots at the same schedule. - Start the prednisone taper sent in.  - Continue with Flonase one spray per nostril daily as needed.  - Continue with Astelin two sprays per nostril twice daily.  - Continue with Singulair  daily.  - Continue with Zyrtec daily and chlorpheniramine as needed for breakthrough symptoms.   2. Mild persistent asthma, uncomplicated - Lung testing not done today.  - We are not going to make any changes at this time.  - Daily controller medication(s): Singulair  daily - Prior to physical activity: albuterol 2 puffs 10-15 minutes before physical activity. - Rescue medications: albuterol 4 puffs every 4-6 hours as needed - Asthma control goals:  * Full participation in all desired activities (may need albuterol before activity) * Albuterol use two time or less a week on average (not counting use with activity) * Cough interfering with sleep two time or less a month * Oral steroids no more than once a year * No hospitalizations  3. Gastroesophageal reflux disease - Continue with pantoprazole  daily as needed.  4. Return in about 3 months (around 05/21/2023) for REPEAT TESTING.  Stop your antihistamines for 3 days before the next appointment.   Subjective:   Brooke Farrell is a 67 y.o. female presenting today for follow up of  Chief Complaint  Patient presents with   Follow-up    Pt states she has been doing good so far until pollen starting falling  she has been feeling terrible and dried out.    Brooke Farrell has a history of the following: Patient Active Problem List   Diagnosis Date Noted   Encounter for general adult medical examination with abnormal findings 12/12/2022   Type 2 diabetes mellitus without complications 11/19/2022   Anxiety and depression 11/19/2022   Primary insomnia 09/14/2019   OSA on CPAP 09/10/2018   Seasonal and perennial allergic rhinitis 08/26/2018   Mild intermittent asthma, uncomplicated 08/26/2018   IBS (irritable bowel syndrome) 03/02/2018   Gastroesophageal reflux disease with esophagitis 01/16/2015   H/O bilateral breast implants 01/21/2013   HPV (human papilloma virus) anogenital infection 05/25/2012    History obtained from: chart review and patient.  Brooke Farrell is a 67 y.o. female presenting for a follow up visit.  She was last seen in January 2023.  At that time, we continue with allergy shots at the same schedule.  Her lung testing looked great.  We continue with Singulair daily as well as albuterol as needed.  For her GERD, we continue with Protonix daily.  Since last visit, she has done well.   Asthma/Respiratory Symptom History: She remains on her montelukast daily. Breathing is under good control with the current regimen. She is using her rescue inhaler at least once daily. Ryland's asthma has been well controlled. She has not required rescue medication, experienced nocturnal awakenings due to lower respiratory symptoms, nor have activities of daily living been limited. She has required no Emergency Department or Urgent Care visits for her asthma. She has required  zero courses of systemic steroids for asthma exacerbations since the last visit. ACT score today is 22, indicating excellent asthma symptom control.   Allergic Rhinitis Symptom History: She has Astelin and uses this twice daily. She is using cetirizine daily and she is using chlorpheniramine every 4 hours. She did take one prednisone  with improvement in her symptoms.  She remains on her allergy shots. She is getting them monthly and these previously seemed to be working. She is not so sure any more.   Daielle is on allergen immunotherapy. She receives two injections. Immunotherapy script #1 contains trees, weeds, grasses, cat, and dog. She currently receives 0.106mL of the RED vial (1/100). Immunotherapy script #2 contains  ragweed, molds, dust mites, and cockroach. She currently receives 0.53mL of the RED vial (1/100). She started shots June of 2020 and reached maintenance in December of 2020.  She has been married to her husband's for 32 years now.  This was their second marriage for both.  They apparently dated for 4 years because they were afraid of getting married again.  She said that both she and her husband claim to have been previously married to the "[employer] [place]".  Her own [employer] passed away, but she does have to see her husband's ex-wife at family gatherings.  They all seem to be a good behavior.   They are working on planting their half acre garden. They typically give away food to their family, but she occasionally will bring [place] some tomatoes.   Otherwise, there have been no changes to her past medical history, surgical history, family history, or social history.    Review of Systems  Constitutional: Negative.  Negative for chills, fever, malaise/fatigue and weight loss.  HENT:  Positive for congestion. Negative for ear discharge, ear pain, sinus pain and sore throat.        Positive for postnasal drip. Positive for throat clearing.   Eyes:  Negative for pain, discharge and redness.  Respiratory:  Negative for cough, sputum production, shortness of breath and wheezing.   Cardiovascular: Negative.  Negative for chest pain and palpitations.  Gastrointestinal:  Negative for abdominal pain, constipation, diarrhea, heartburn, nausea and vomiting.  Skin: Negative.  Negative for itching and rash.  Neurological:   Negative for dizziness and headaches.  Endo/Heme/Allergies:  Positive for environmental allergies. Does not bruise/bleed easily.       Objective:   Blood pressure 138/72, pulse 100, temperature 98 F (36.7 C), resp. rate 20, height  (1.727 m), weight 213 lb (96.6 kg), SpO2 96 %. Body mass index is 32.39 kg/m.    Physical Exam Vitals reviewed.  Constitutional:      Appearance: She is well-developed.     Comments: Talkative female. Cooperative with the exam.   HENT:     Head: Normocephalic and atraumatic.     Right Ear: Tympanic membrane, ear canal and external ear normal.     Left Ear: Tympanic membrane, ear canal and external ear normal.     Nose: No nasal deformity, septal deviation, mucosal edema or rhinorrhea.     Right Turbinates: Enlarged, swollen and pale.     Left Turbinates: Enlarged, swollen and pale.     Right Sinus: No maxillary sinus tenderness or frontal sinus tenderness.     Left Sinus: No maxillary sinus tenderness or frontal sinus tenderness.     Mouth/Throat:     Mouth: Mucous membranes are not pale and not dry.     Pharynx:  Uvula midline.     Comments: Cobblestoning present in the posterior oropharynx.  Eyes:     General: Lids are normal. Allergic shiner present.        Right eye: No discharge.        Left eye: No discharge.     Conjunctiva/sclera: Conjunctivae normal.     Right eye: Right conjunctiva is not injected. No chemosis.    Left eye: Left conjunctiva is not injected. No chemosis.    Pupils: Pupils are equal, round, and reactive to light.  Cardiovascular:     Rate and Rhythm: Normal rate and regular rhythm.     Heart sounds: Normal heart sounds.  Pulmonary:     Effort: Pulmonary effort is normal. No tachypnea, accessory muscle usage or respiratory distress.     Breath sounds: Normal breath sounds. No wheezing, rhonchi or rales.     Comments: Moving air well in all lung fields. No increased work of breathing noted. Chest:     Chest wall:  No tenderness.  Lymphadenopathy:     Cervical: No cervical adenopathy.  Skin:    Coloration: Skin is not pale.     Findings: No abrasion, erythema, petechiae or rash. Rash is not papular, urticarial or vesicular.  Neurological:     Mental Status: She is alert.  Psychiatric:        Behavior: Behavior is cooperative.      Diagnostic studies: none      Malachi Bonds, MD  [facility]

## 2023-02-19 NOTE — Patient Instructions (Addendum)
1. Seasonal and perennial allergic rhinitis - Continue with allergy shots at the same schedule. - Start the prednisone taper sent in.  - Continue with Flonase one spray per nostril daily as needed.  - Continue with Astelin two sprays per nostril twice daily.  - Continue with Singulair  daily.  - Continue with Zyrtec daily and chlorpheniramine as needed for breakthrough symptoms.   2. Mild persistent asthma, uncomplicated - Lung testing not done today.  - We are not going to make any changes at this time.  - Daily controller medication(s): Singulair  daily - Prior to physical activity: albuterol 2 puffs 10-15 minutes before physical activity. - Rescue medications: albuterol 4 puffs every 4-6 hours as needed - Asthma control goals:  * Full participation in all desired activities (may need albuterol before activity) * Albuterol use two time or less a week on average (not counting use with activity) * Cough interfering with sleep two time or less a month * Oral steroids no more than once a year * No hospitalizations  3. Gastroesophageal reflux disease - Continue with pantoprazole  daily as needed.  4. Return in about 3 months (around 05/21/2023) for REPEAT TESTING.  Stop your antihistamines for 3 days before the next appointment.    Please inform us of any Emergency Department visits, hospitalizations, or changes in symptoms. Call us before going to the ED for breathing or allergy symptoms since we might be able to fit you in for a sick visit. Feel free to contact us anytime with any questions, problems, or concerns.  It was a pleasure to see you again today!  Websites that have reliable patient information: 1. American Academy of Asthma, Allergy, and Immunology: www.aaaai.org 2. Food Allergy Research and Education (FARE): foodallergy.org 3. Mothers of Asthmatics: http://www.asthmacommunitynetwork.org 4. American College of Allergy, Asthma, and Immunology:  www.acaai.org   COVID-19 Vaccine Information can be found at: [email or web address] For questions related to vaccine distribution or appointments, please email [email or web address] or call [phone].   We realize that you might be concerned about having an allergic reaction to the COVID19 vaccines. To help with that concern, WE ARE OFFERING THE COVID19 VACCINES IN OUR OFFICE! Ask the front desk for dates!     "Like" [place] on Facebook and Instagram for our latest updates!      A healthy democracy works best when [employer] participate! Make sure you are registered to vote! If you have moved or changed any of your contact information, you will need to get this updated before voting!  In some cases, you MAY be able to register to vote online: [email or web address]

## 2023-03-05 ENCOUNTER — Other Ambulatory Visit: Payer: Self-pay | Admitting: Internal Medicine

## 2023-03-05 DIAGNOSIS — F32A Depression, unspecified: Secondary | ICD-10-CM

## 2023-03-18 ENCOUNTER — Ambulatory Visit (INDEPENDENT_AMBULATORY_CARE_PROVIDER_SITE_OTHER): Payer: Medicare Other | Admitting: Internal Medicine

## 2023-03-18 ENCOUNTER — Encounter: Payer: Self-pay | Admitting: Internal Medicine

## 2023-03-18 ENCOUNTER — Other Ambulatory Visit: Payer: Self-pay

## 2023-03-18 VITALS — BP 136/86 | HR 86 | Ht 68.5 in | Wt 211.6 lb

## 2023-03-18 DIAGNOSIS — F419 Anxiety disorder, unspecified: Secondary | ICD-10-CM

## 2023-03-18 DIAGNOSIS — E785 Hyperlipidemia, unspecified: Secondary | ICD-10-CM

## 2023-03-18 DIAGNOSIS — Z1329 Encounter for screening for other suspected endocrine disorder: Secondary | ICD-10-CM

## 2023-03-18 DIAGNOSIS — E119 Type 2 diabetes mellitus without complications: Secondary | ICD-10-CM | POA: Diagnosis not present

## 2023-03-18 DIAGNOSIS — K2100 Gastro-esophageal reflux disease with esophagitis, without bleeding: Secondary | ICD-10-CM

## 2023-03-18 DIAGNOSIS — F32A Depression, unspecified: Secondary | ICD-10-CM

## 2023-03-18 DIAGNOSIS — Z6831 Body mass index (BMI) 31.0-31.9, adult: Secondary | ICD-10-CM

## 2023-03-18 DIAGNOSIS — Z1159 Encounter for screening for other viral diseases: Secondary | ICD-10-CM

## 2023-03-18 DIAGNOSIS — J4520 Mild intermittent asthma, uncomplicated: Secondary | ICD-10-CM

## 2023-03-18 DIAGNOSIS — L309 Dermatitis, unspecified: Secondary | ICD-10-CM | POA: Insufficient documentation

## 2023-03-18 DIAGNOSIS — Z13220 Encounter for screening for lipoid disorders: Secondary | ICD-10-CM

## 2023-03-18 DIAGNOSIS — Z1321 Encounter for screening for nutritional disorder: Secondary | ICD-10-CM

## 2023-03-18 DIAGNOSIS — E1169 Type 2 diabetes mellitus with other specified complication: Secondary | ICD-10-CM

## 2023-03-18 DIAGNOSIS — K580 Irritable bowel syndrome with diarrhea: Secondary | ICD-10-CM

## 2023-03-18 MED ORDER — TIRZEPATIDE 2.5 MG/0.5ML ~~LOC~~ SOAJ
2.5000 mg | SUBCUTANEOUS | 0 refills | Status: DC
Start: 2023-03-18 — End: 2023-04-03

## 2023-03-18 MED ORDER — LISINOPRIL 5 MG PO TABS
5.0000 mg | ORAL_TABLET | Freq: Every day | ORAL | 3 refills | Status: DC
Start: 2023-03-18 — End: 2024-02-25

## 2023-03-18 MED ORDER — TRIAMCINOLONE ACETONIDE 0.025 % EX OINT
1.0000 | TOPICAL_OINTMENT | Freq: Two times a day (BID) | CUTANEOUS | 0 refills | Status: DC
Start: 2023-03-18 — End: 2024-08-30

## 2023-03-18 MED ORDER — ATORVASTATIN CALCIUM 10 MG PO TABS
10.0000 mg | ORAL_TABLET | Freq: Every day | ORAL | 3 refills | Status: DC
Start: 2023-03-18 — End: 2024-02-25

## 2023-03-18 NOTE — Progress Notes (Signed)
Established Patient Office Visit  Subjective   Patient ID: Brooke Farrell, female    DOB: 03-02-1956  Age: 67 y.o. MRN: [id number]  Chief Complaint  Patient presents with   Diabetes    Follow up   Brooke Farrell returns to care today for routine follow-up.  Last evaluated by me on 2/8 as a new patient presenting to establish care.  Fluoxetine 20 mg daily was resumed at that time for treatment of anxiety and depression.  Lomotil was continued at the same dose.  In the interim she has been seen by allergy & asthma (Dr. Dellis Anes) and ophthalmology.  There have otherwise been no acute interval events. Brooke Farrell reports feeling well today.  She is asymptomatic currently.  She recently completed her annual mammogram, which was notable for breast calcifications.  She would like to review this today.  She would additionally like to follow-up on diabetes mellitus.  Past Medical History:  Diagnosis Date   Acid reflux    Allergy    Anxiety    Asthma    Cataract    Depression    Encounter for general adult medical examination with abnormal findings 12/12/2022   Sleep apnea    Past Surgical History:  Procedure Laterality Date   AUGMENTATION MAMMAPLASTY  1985   CESAREAN SECTION     EYE SURGERY Bilateral 05/01/2022   eyelid surgery   JOINT REPLACEMENT     NO PAST SURGERIES     TOTAL HIP ARTHROPLASTY  01/2020   dr. Despina Hick   Social History   Tobacco Use   Smoking status: Never   Smokeless tobacco: Never  Vaping Use   Vaping Use: Never used  Substance Use Topics   Alcohol use: Yes    Comment: two drinks a month   Drug use: No   Family History  Problem Relation Age of Onset   Cancer Father        bone   Diabetes Father    Hypertension Father    Heart attack Father    Breast cancer Mother    Kidney disease Mother    Varicose Veins Mother    Diverticulitis Sister    Allergic rhinitis Neg Hx    Angioedema Neg Hx    Asthma Neg Hx    Atopy Neg Hx    Eczema Neg Hx     Immunodeficiency Neg Hx    Urticaria Neg Hx    Sleep apnea Neg Hx    Allergies  Allergen Reactions   Mixed Grasses Anaphylaxis   Prednisone Other (See Comments)    Patient reports having very bad mood swings and was very angry when on this drug   Robitussin (Alcohol Free)  [Guaifenesin] Swelling   Sulfa Antibiotics Swelling   Corticosteroids    Amoxicillin Diarrhea   Review of Systems  Constitutional:  Negative for chills and fever.  HENT:  Negative for sore throat.   Respiratory:  Negative for cough and shortness of breath.   Cardiovascular:  Negative for chest pain, palpitations and leg swelling.  Gastrointestinal:  Negative for abdominal pain, blood in stool, constipation, diarrhea, nausea and vomiting.  Genitourinary:  Negative for dysuria and hematuria.  Musculoskeletal:  Negative for myalgias.  Skin:  Negative for itching and rash.  Neurological:  Negative for dizziness and headaches.  Psychiatric/Behavioral:  Negative for depression and suicidal ideas.      Objective:     BP 136/86   Pulse 86   Ht 5' 8.5" (1.74 m)  Wt 211 lb 9.6 oz (96 kg)   SpO2 95%   BMI 31.71 kg/m  BP Readings from Last 3 Encounters:  03/18/23 136/86  02/19/23 138/72  12/12/22 131/79   Physical Exam Vitals reviewed.  Constitutional:      General: She is not in acute distress.    Appearance: Normal appearance. She is obese. She is not toxic-appearing.  HENT:     Head: Normocephalic and atraumatic.     Right Ear: External ear normal.     Left Ear: External ear normal.     Nose: Nose normal. No congestion or rhinorrhea.     Mouth/Throat:     Mouth: Mucous membranes are moist.     Pharynx: Oropharynx is clear. No oropharyngeal exudate or posterior oropharyngeal erythema.  Eyes:     General: No scleral icterus.    Extraocular Movements: Extraocular movements intact.     Conjunctiva/sclera: Conjunctivae normal.     Pupils: Pupils are equal, round, and reactive to light.   Cardiovascular:     Rate and Rhythm: Normal rate and regular rhythm.     Pulses: Normal pulses.     Heart sounds: Normal heart sounds. No murmur heard.    No friction rub. No gallop.  Pulmonary:     Effort: Pulmonary effort is normal.     Breath sounds: Normal breath sounds. No wheezing, rhonchi or rales.  Abdominal:     General: Abdomen is flat. Bowel sounds are normal. There is no distension.     Palpations: Abdomen is soft.     Tenderness: There is no abdominal tenderness.  Musculoskeletal:        General: No swelling. Normal range of motion.     Cervical back: Normal range of motion.     Right lower leg: No edema.     Left lower leg: No edema.  Lymphadenopathy:     Cervical: No cervical adenopathy.  Skin:    General: Skin is warm and dry.     Capillary Refill: Capillary refill takes less than 2 seconds.     Coloration: Skin is not jaundiced.  Neurological:     General: No focal deficit present.     Mental Status: She is alert and oriented to person, place, and time.  Psychiatric:        Mood and Affect: Mood normal.        Behavior: Behavior normal.    Last hemoglobin A1c Lab Results  Component Value Date   HGBA1C 6.5 10/01/2022   The 10-year ASCVD risk score (Arnett DK, et al., 2019) is: 16.4%    Assessment & Plan:   Problem List Items Addressed This Visit       IBS (irritable bowel syndrome)    IBS-D.  Symptoms remain well-controlled with Lomotil. -No medication changes today      Type 2 diabetes mellitus without complications (HCC)    A1c 6.5 in November 2023.  Not currently on any medications.  She had previously discussed starting GLP-1 therapy with her former PCP. -Repeat A1c ordered today -Urine microalbumin/creatinine ratio ordered today -Through shared decision making, we will start Mounjaro 2.5 mg weekly -Start lisinopril 5 mg daily for renal protection in the setting of diabetes mellitus -Start atorvastatin 10 mg daily -We will tentatively plan  for follow-up in 3 months -Recently evaluated by ophthalmology.  We will request records. -Podiatry follow-up scheduled for Friday (5/17).      Eczema    Recently evaluated by dermatology for a rash on  the lateral aspect of the left lower extremity.  Diagnosed with eczema.  States that topical cream was prescribed but not covered by her insurance.  She requested that a different topical agent be prescribed, but this was never completed.   -She will establish care with a new dermatologist.  In the interim, I have prescribed triamcinolone cream for as needed application.      Anxiety and depression    Mood stable.  Anxiety well-controlled with Prozac.      Return in about 3 months (around 06/18/2023).   Billie Lade, MD

## 2023-03-18 NOTE — Assessment & Plan Note (Signed)
Recently evaluated by dermatology for a rash on the lateral aspect of the left lower extremity.  Diagnosed with eczema.  States that topical cream was prescribed but not covered by her insurance.  She requested that a different topical agent be prescribed, but this was never completed.   -She will establish care with a new dermatologist.  In the interim, I have prescribed triamcinolone cream for as needed application.

## 2023-03-18 NOTE — Assessment & Plan Note (Signed)
A1c 6.5 in November 2023.  Not currently on any medications.  She had previously discussed starting GLP-1 therapy with her former PCP. -Repeat A1c ordered today -Urine microalbumin/creatinine ratio ordered today -Through shared decision making, we will start Mounjaro 2.5 mg weekly -Start lisinopril 5 mg daily for renal protection in the setting of diabetes mellitus -Start atorvastatin 10 mg daily -We will tentatively plan for follow-up in 3 months -Recently evaluated by ophthalmology.  We will request records. -Podiatry follow-up scheduled for Friday (5/17).

## 2023-03-18 NOTE — Patient Instructions (Signed)
It was a pleasure to see you today.  Thank you for giving [place] the opportunity to be involved in your care.  Below is a brief recap of your visit and next steps.  We will plan to see you again in 3 months.  Summary Update bloodwork today Start Mounjaro 2.5 mg weekly Start lisinopril 5 mg daily Start Atorvastatin 10 mg daily Follow up in 3 months

## 2023-03-18 NOTE — Assessment & Plan Note (Signed)
IBS-D.  Symptoms remain well-controlled with Lomotil. -No medication changes today

## 2023-03-18 NOTE — Assessment & Plan Note (Signed)
Mood stable.  Anxiety well-controlled with Prozac.

## 2023-03-19 ENCOUNTER — Ambulatory Visit (INDEPENDENT_AMBULATORY_CARE_PROVIDER_SITE_OTHER): Payer: Medicare Other

## 2023-03-19 DIAGNOSIS — J309 Allergic rhinitis, unspecified: Secondary | ICD-10-CM

## 2023-03-19 LAB — CBC WITH DIFFERENTIAL/PLATELET
Basophils Absolute: 0.1 10*3/uL (ref 0.0–0.2)
Basos: 1 %
EOS (ABSOLUTE): 0.5 10*3/uL — ABNORMAL HIGH (ref 0.0–0.4)
Eos: 9 %
Hematocrit: 40.8 % (ref 34.0–46.6)
Hemoglobin: 13.5 g/dL (ref 11.1–15.9)
Immature Grans (Abs): 0 10*3/uL (ref 0.0–0.1)
Immature Granulocytes: 0 %
Lymphocytes Absolute: 1.6 10*3/uL (ref 0.7–3.1)
Lymphs: 28 %
MCH: 27.8 pg (ref 26.6–33.0)
MCHC: 33.1 g/dL (ref 31.5–35.7)
MCV: 84 fL (ref 79–97)
Monocytes Absolute: 0.8 10*3/uL (ref 0.1–0.9)
Monocytes: 14 %
Neutrophils Absolute: 2.7 10*3/uL (ref 1.4–7.0)
Neutrophils: 48 %
Platelets: 254 10*3/uL (ref 150–450)
RBC: 4.86 x10E6/uL (ref 3.77–5.28)
RDW: 13 % (ref 11.7–15.4)
WBC: 5.7 10*3/uL (ref 3.4–10.8)

## 2023-03-19 LAB — CMP14+EGFR
ALT: 43 IU/L — ABNORMAL HIGH (ref 0–32)
AST: 40 IU/L (ref 0–40)
Albumin/Globulin Ratio: 1.6 (ref 1.2–2.2)
Albumin: 4.2 g/dL (ref 3.9–4.9)
Alkaline Phosphatase: 64 IU/L (ref 44–121)
BUN/Creatinine Ratio: 14 (ref 12–28)
BUN: 13 mg/dL (ref 8–27)
Bilirubin Total: 0.3 mg/dL (ref 0.0–1.2)
CO2: 24 mmol/L (ref 20–29)
Calcium: 9.5 mg/dL (ref 8.7–10.3)
Chloride: 103 mmol/L (ref 96–106)
Creatinine, Ser: 0.92 mg/dL (ref 0.57–1.00)
Globulin, Total: 2.7 g/dL (ref 1.5–4.5)
Glucose: 95 mg/dL (ref 70–99)
Potassium: 5 mmol/L (ref 3.5–5.2)
Sodium: 139 mmol/L (ref 134–144)
Total Protein: 6.9 g/dL (ref 6.0–8.5)
eGFR: 68 mL/min/{1.73_m2} (ref >59)

## 2023-03-19 LAB — TSH+FREE T4
Free T4: 1.23 ng/dL (ref 0.82–1.77)
TSH: 2.06 u[IU]/mL (ref 0.450–4.500)

## 2023-03-19 LAB — HCV AB W REFLEX TO QUANT PCR: HCV Ab: NONREACTIVE

## 2023-03-19 LAB — LIPID PANEL
Chol/HDL Ratio: 2.4 ratio (ref 0.0–4.4)
Cholesterol, Total: 128 mg/dL (ref 100–199)
HDL: 53 mg/dL (ref >39)
LDL Chol Calc (NIH): 61 mg/dL (ref 0–99)
Triglycerides: 70 mg/dL (ref 0–149)
VLDL Cholesterol Cal: 14 mg/dL (ref 5–40)

## 2023-03-19 LAB — HEMOGLOBIN A1C
Est. average glucose Bld gHb Est-mCnc: 137 mg/dL
Hgb A1c MFr Bld: 6.4 % — ABNORMAL HIGH (ref 4.8–5.6)

## 2023-03-19 LAB — VITAMIN D 25 HYDROXY (VIT D DEFICIENCY, FRACTURES): Vit D, 25-Hydroxy: 48 ng/mL (ref 30.0–100.0)

## 2023-03-19 LAB — HCV INTERPRETATION

## 2023-03-19 LAB — B12 AND FOLATE PANEL
Folate: >20 ng/mL (ref >3.0)
Vitamin B-12: 683 pg/mL (ref 232–1245)

## 2023-03-21 ENCOUNTER — Ambulatory Visit (INDEPENDENT_AMBULATORY_CARE_PROVIDER_SITE_OTHER): Payer: Medicare Other

## 2023-03-21 DIAGNOSIS — B351 Tinea unguium: Secondary | ICD-10-CM | POA: Diagnosis not present

## 2023-03-21 LAB — MICROALBUMIN / CREATININE URINE RATIO
Creatinine, Urine: 103.8 mg/dL
Microalb/Creat Ratio: 4 mg/g creat (ref 0–29)
Microalbumin, Urine: 3.8 ug/mL

## 2023-03-21 NOTE — Progress Notes (Signed)
Patient presents today for the 2nd laser treatment. Diagnosed with mycotic nail infection by Dr. Ardelle Anton .   Toenail most affected 1-5 bilaterally .  All other systems are negative.  Nails were filed thin. Laser therapy was administered to 1-5 toenails bilaterally  and patient tolerated the treatment well. All safety precautions were in place.   Single laser pass was done on non-affected nails.   Follow up in 4 weeks for laser # 3.

## 2023-03-25 ENCOUNTER — Other Ambulatory Visit: Payer: Self-pay | Admitting: Internal Medicine

## 2023-03-25 DIAGNOSIS — Z1231 Encounter for screening mammogram for malignant neoplasm of breast: Secondary | ICD-10-CM

## 2023-03-27 ENCOUNTER — Other Ambulatory Visit: Payer: Self-pay | Admitting: Podiatry

## 2023-03-27 NOTE — Telephone Encounter (Signed)
Spoke with Dr. Ardelle Anton regarding refill request that we received on the tavaborole Jodi Geralds) nail solution.  She recently had her 2nd laser treatment here this month.  Refill was approved as per Dr. Gabriel Rung instructions

## 2023-03-28 ENCOUNTER — Ambulatory Visit (INDEPENDENT_AMBULATORY_CARE_PROVIDER_SITE_OTHER): Payer: Medicare Other

## 2023-03-28 DIAGNOSIS — J309 Allergic rhinitis, unspecified: Secondary | ICD-10-CM

## 2023-03-31 ENCOUNTER — Other Ambulatory Visit: Payer: Self-pay | Admitting: Internal Medicine

## 2023-03-31 DIAGNOSIS — L309 Dermatitis, unspecified: Secondary | ICD-10-CM

## 2023-04-03 ENCOUNTER — Other Ambulatory Visit: Payer: Self-pay | Admitting: Internal Medicine

## 2023-04-03 DIAGNOSIS — E119 Type 2 diabetes mellitus without complications: Secondary | ICD-10-CM

## 2023-04-11 ENCOUNTER — Ambulatory Visit (INDEPENDENT_AMBULATORY_CARE_PROVIDER_SITE_OTHER): Payer: Medicare Other

## 2023-04-11 DIAGNOSIS — J309 Allergic rhinitis, unspecified: Secondary | ICD-10-CM | POA: Diagnosis not present

## 2023-04-16 ENCOUNTER — Ambulatory Visit (INDEPENDENT_AMBULATORY_CARE_PROVIDER_SITE_OTHER): Payer: Medicare Other

## 2023-04-16 DIAGNOSIS — J309 Allergic rhinitis, unspecified: Secondary | ICD-10-CM

## 2023-04-23 ENCOUNTER — Ambulatory Visit: Payer: Medicare Other | Admitting: Adult Health

## 2023-04-24 NOTE — Progress Notes (Signed)
PATIENT: Brooke Farrell DOB: [birth year]  REASON FOR VISIT: follow up HISTORY FROM: patient PRIMARY NEUROLOGIST:  Dr. Frances Furbish  Chief Complaint  Patient presents with   Follow-up    Rm 19, alone.  Cpap follow up.  Doing ok.       HISTORY OF PRESENT ILLNESS: Today 04/28/23:  Brooke Farrell is a 67 y.o. female with a history of OSA on CPAP. Returns today for follow-up.  She reports that the CPAP is working well.  She denies any new symptoms.  Download is below      04/17/22: Brooke Farrell is a 67 year old female with a history of obstructive sleep apnea on CPAP.  She returns today for follow-up.  She reports that the CPAP is working well for Brooke Farrell.  She does not like to sleep without it.  Download is below    04/11/21: Brooke Farrell is a 67 year old female with a history of obstructive sleep apnea on CPAP.  She returns today for follow-up.  She states that everything is going well with the CPAP.  Brooke Farrell only issue is that she needs new supplies and has not been able to get this from [employer].    HISTORY (copied from Dr. Teofilo Pod note)  04/05/2020: I reviewed Brooke Farrell CPAP compliance data from 03/05/2020 through 04/03/2020, which is a total of 30 days, during which time she used Brooke [facility]rell machine every night with percent use days greater than 4 hours at 97%, indicating excellent compliance with an average usage of 7 hours and 55 minutes, residual AHI at goal at 3.6/h, leak on the higher side with a 95th percentile at 19.5 L/min, pressure of 10 cm with EPR of 3.  She reports feeling stable and doing well with Brooke Farrell CPAP, indicates full compliance and ongoing good results.  She is up-to-date with Brooke Farrell supplies and uses nasal pillows.  Although the pressure is currently at 10 cm, it seems adequate at this time.   The patient's allergies, current medications, family history, past medical history, past social history, past surgical history and problem list were reviewed and updated as appropriate.   REVIEW OF  SYSTEMS: Out of a complete 14 system review of symptoms, the patient complains only of the following symptoms, and all other reviewed systems are negative.   ESS 6  ALLERGIES: Allergies  Allergen Reactions   Mixed Grasses Anaphylaxis   Prednisone Other (See Comments)    Patient reports having very bad mood swings and was very angry when on this drug   Robitussin (Alcohol Free)  [Guaifenesin] Swelling   Sulfa Antibiotics Swelling   Corticosteroids    Amoxicillin Diarrhea    HOME MEDICATIONS: Outpatient Medications Prior to Visit  Medication Sig Dispense Refill   albuterol (VENTOLIN HFA) 108 (90 Base) MCG/ACT inhaler Inhale 2 puffs into the lungs every 4 (four) hours as needed for wheezing or shortness of breath. 18 g 1   atorvastatin (LIPITOR) 10 MG tablet Take 1 tablet (10 mg total) by mouth daily. 90 tablet 3   azelastine (ASTELIN) 0.1 % nasal spray Place 1 spray into both nostrils daily. Use in each nostril as directed 90 mL 3   cetirizine (ZYRTEC) 10 MG tablet Take 1 tablet (10 mg total) by mouth daily as needed. 90 tablet 3   clonazePAM (KLONOPIN) 0.5 MG tablet Take 0.5 mg by mouth 2 (two) times daily as needed for anxiety.     cyclobenzaprine (FLEXERIL) 10 MG tablet Take 10 mg by mouth at bedtime.  diphenoxylate-atropine (LOMOTIL) 2.5-0.025 MG tablet TAKE 1 TABLET BY MOUTH 4 (FOUR) TIMES DAILY AS NEEDED FOR DIARRHEA OR LOOSE STOOLS. 60 tablet 0   Efinaconazole 10 % SOLN Apply 1 drop topically daily. 4 mL 11   FLUoxetine (PROZAC) 20 MG capsule TAKE 1 CAPSULE BY MOUTH EVERY DAY 90 capsule 0   fluticasone (FLONASE) 50 MCG/ACT nasal spray Place 2 sprays into both nostrils daily. 48 g 3   lisinopril (ZESTRIL) 5 MG tablet Take 1 tablet (5 mg total) by mouth daily. 90 tablet 3   montelukast (SINGULAIR) 10 MG tablet Take 1 tablet (10 mg total) by mouth at bedtime. 90 tablet 3   pantoprazole (PROTONIX) 40 MG tablet Take 1 tablet (40 mg total) by mouth daily. 30 tablet 1   Tavaborole 5  % SOLN APPLY 1 DROP TOPICALLY DAILY. APPLY 1 DROP TO THE TOENAIL DAILY. 10 mL 1   tirzepatide (MOUNJARO) 2.5 MG/0.5ML Pen INJECT 2.5 MG SUBCUTANEOUSLY WEEKLY 2 mL 0   triamcinolone (KENALOG) 0.025 % ointment Apply 1 Application topically 2 (two) times daily. 30 g 0   No facility-administered medications prior to visit.    PAST MEDICAL HISTORY: Past Medical History:  Diagnosis Date   Acid reflux    Allergy    Anxiety    Asthma    Cataract    Depression    Diabetes mellitus without complication (HCC)    Encounter for general adult medical examination with abnormal findings 12/12/2022   Hypertension    Sleep apnea     PAST SURGICAL HISTORY: Past Surgical History:  Procedure Laterality Date   AUGMENTATION MAMMAPLASTY  1985   CESAREAN SECTION     EYE SURGERY Bilateral 05/01/2022   eyelid surgery   JOINT REPLACEMENT     NO PAST SURGERIES     TOTAL HIP ARTHROPLASTY  01/2020   dr. Despina Hick    FAMILY HISTORY: Family History  Problem Relation Age of Onset   Cancer Father        bone   Diabetes Father    Hypertension Father    Heart attack Father    Breast cancer Mother    Kidney disease Mother    Varicose Veins Mother    Diverticulitis Sister    Allergic rhinitis Neg Hx    Angioedema Neg Hx    Asthma Neg Hx    Atopy Neg Hx    Eczema Neg Hx    Immunodeficiency Neg Hx    Urticaria Neg Hx    Sleep apnea Neg Hx     SOCIAL HISTORY: Social History   Socioeconomic History   Marital status: Married    Spouse name: Not on file   Number of children: Not on file   Years of education: Not on file   Highest education level: 12th grade  Occupational History   Not on file  Tobacco Use   Smoking status: Never   Smokeless tobacco: Never  Vaping Use   Vaping Use: Never used  Substance and Sexual Activity   Alcohol use: Yes    Comment: two drinks a month   Drug use: No   Sexual activity: Not Currently    Partners: Male    Birth control/protection: Post-menopausal     Comment: 1st intercourse- 16, partners- 6, married- 25 yrs  Other Topics Concern   Not on file  Social History Narrative   Not on file   Social Determinants of Health   Financial Resource Strain: Low Risk  (03/14/2023)   Overall Financial  Resource Strain (CARDIA)    Difficulty of Paying Living Expenses: Not hard at all  Food Insecurity: No Food Insecurity (03/14/2023)   Hunger Vital Sign    Worried About Running Out of Food in the Last Year: Never true    Ran Out of Food in the Last Year: Never true  Transportation Needs: No Transportation Needs (03/14/2023)   PRAPARE - Administrator, Civil Service (Medical): No    Lack of Transportation (Non-Medical): No  Physical Activity: Insufficiently Active (03/14/2023)   Exercise Vital Sign    Days of Exercise per Week: 4 days    Minutes of Exercise per Session: 20 min  Stress: Stress Concern Present (03/14/2023)   [employer] of Occupational Health - Occupational Stress Questionnaire    Feeling of Stress : To some extent  Social Connections: Socially Integrated (03/14/2023)   Social Connection and Isolation Panel [NHANES]    Frequency of Communication with Friends and Family: Three times a week    Frequency of Social Gatherings with Friends and Family: Once a week    Attends Religious Services: More than 4 times per year    Active Member of [employer] or Organizations: Yes    Attends Banker Meetings: 1 to 4 times per year    Marital Status: Married  Catering manager Violence: Not At Risk (05/22/2022)   Humiliation, Afraid, Rape, and Kick questionnaire    Fear of Current or Ex-Partner: No    Emotionally Abused: No    Physically Abused: No    Sexually Abused: No      PHYSICAL EXAM  Vitals:   04/28/23 0914  BP: 125/81  Pulse: 87  Weight: 209 lb 9.6 oz (95.1 kg)  Height: 5' 8.5" (1.74 m)   Body mass index is 31.41 kg/m.  Generalized: Well developed, in no acute distress  Chest: Lungs clear to  auscultation bilaterally  Neurological examination  Mentation: Alert oriented to time, place, history taking. Follows all commands speech and language fluent Cranial nerve II-XII: Facial symmetry noted   DIAGNOSTIC DATA (LABS, IMAGING, TESTING) - I reviewed patient records, labs, notes, testing and imaging myself where available.     ASSESSMENT AND PLAN 67 y.o. year old female  has a past medical history of Acid reflux, Allergy, Anxiety, Asthma, Cataract, Depression, Diabetes mellitus without complication (HCC), Encounter for general adult medical examination with abnormal findings (12/12/2022), Hypertension, and Sleep apnea. here with:  OSA on CPAP  - CPAP compliance excellent - Good treatment of AHI  - Encourage patient to use CPAP nightly and > 4 hours each night - F/U in 1 year or sooner if needed   Butch Penny, MSN, NP-C 04/28/2023, 9:34 AM [facility] [address], [address] [place], [place] [address] [phone]

## 2023-04-25 ENCOUNTER — Ambulatory Visit (INDEPENDENT_AMBULATORY_CARE_PROVIDER_SITE_OTHER): Payer: Medicare Other

## 2023-04-25 DIAGNOSIS — J309 Allergic rhinitis, unspecified: Secondary | ICD-10-CM

## 2023-04-28 ENCOUNTER — Ambulatory Visit (INDEPENDENT_AMBULATORY_CARE_PROVIDER_SITE_OTHER): Payer: Medicare Other | Admitting: Podiatry

## 2023-04-28 ENCOUNTER — Encounter: Payer: Self-pay | Admitting: Adult Health

## 2023-04-28 ENCOUNTER — Ambulatory Visit (INDEPENDENT_AMBULATORY_CARE_PROVIDER_SITE_OTHER): Payer: Medicare Other | Admitting: Adult Health

## 2023-04-28 VITALS — BP 125/81 | HR 87 | Ht 68.5 in | Wt 209.6 lb

## 2023-04-28 DIAGNOSIS — R635 Abnormal weight gain: Secondary | ICD-10-CM | POA: Insufficient documentation

## 2023-04-28 DIAGNOSIS — G4733 Obstructive sleep apnea (adult) (pediatric): Secondary | ICD-10-CM

## 2023-04-28 DIAGNOSIS — E119 Type 2 diabetes mellitus without complications: Secondary | ICD-10-CM | POA: Diagnosis not present

## 2023-04-28 DIAGNOSIS — K219 Gastro-esophageal reflux disease without esophagitis: Secondary | ICD-10-CM | POA: Insufficient documentation

## 2023-04-28 DIAGNOSIS — B351 Tinea unguium: Secondary | ICD-10-CM

## 2023-04-28 DIAGNOSIS — K5730 Diverticulosis of large intestine without perforation or abscess without bleeding: Secondary | ICD-10-CM | POA: Insufficient documentation

## 2023-04-28 DIAGNOSIS — K760 Fatty (change of) liver, not elsewhere classified: Secondary | ICD-10-CM | POA: Insufficient documentation

## 2023-04-28 DIAGNOSIS — R194 Change in bowel habit: Secondary | ICD-10-CM | POA: Insufficient documentation

## 2023-04-28 DIAGNOSIS — R141 Gas pain: Secondary | ICD-10-CM | POA: Insufficient documentation

## 2023-04-28 DIAGNOSIS — K625 Hemorrhage of anus and rectum: Secondary | ICD-10-CM | POA: Insufficient documentation

## 2023-04-28 DIAGNOSIS — K120 Recurrent oral aphthae: Secondary | ICD-10-CM | POA: Insufficient documentation

## 2023-04-28 DIAGNOSIS — E669 Obesity, unspecified: Secondary | ICD-10-CM | POA: Insufficient documentation

## 2023-04-28 MED ORDER — DOXYCYCLINE HYCLATE 100 MG PO TABS: 100.0000 mg | ORAL_TABLET | Freq: Two times a day (BID) | ORAL | 0 refills | Status: DC

## 2023-04-28 NOTE — Patient Instructions (Signed)
Continue using CPAP nightly and greater than 4 hours each night °If your symptoms worsen or you develop new symptoms please let us know.  ° °

## 2023-04-28 NOTE — Patient Instructions (Addendum)
If you notice any swelling, redness, drainage or any signs of infection let me know.    Start doxycyline. If no improvement or any changes, let me know.

## 2023-04-30 NOTE — Progress Notes (Signed)
Subjective: Chief Complaint  Patient presents with   Nail Problem    Pt PCP told her to come here since she recently got diagnosed with diabetes. She is wondering if she can still continue the lazer and the nail fungus topical drops.   67 year old female presents the above concerns.  She agrees that was diagnosed diabetes and requested diabetic foot exam.  She also having a laser for her toenails but she states that the nails were sore afterwards last time.  No drainage or pus or any swelling.  Objective: AAO x3, NAD DP/PT pulses palpable bilaterally, CRT less than 3 seconds Sensation intact with Semmes 20 monofilament. Bilateral hallux nails appear to be thin.  There is localized edema to the nail borders but there is no drainage or pus.  There is no ascending cellulitis.  There is yellow, brown discoloration of the toenails.  There are no open lesions. No pain with calf compression, swelling, warmth, erythema  Assessment: Onychomycosis, diabetic foot exam  Plan: -All treatment options discussed with the patient including all alternatives, risks, complications.  -Provide IV standpoint she is doing well.  Discussed glucose control, daily foot inspection as well as wearing shoes on a regular basis. -Can hold off on lesion for now.  Appears the nails have been cut a little short.  I did prescribe Keflex given localized edema.  Monitor any signs or symptoms of infection including immediately should any occur.  Doxycycline. -Patient encouraged to call the office with any questions, concerns, change in symptoms.   Vivi Barrack DPM

## 2023-05-07 ENCOUNTER — Ambulatory Visit (INDEPENDENT_AMBULATORY_CARE_PROVIDER_SITE_OTHER): Payer: Medicare Other

## 2023-05-07 DIAGNOSIS — J309 Allergic rhinitis, unspecified: Secondary | ICD-10-CM

## 2023-05-10 ENCOUNTER — Other Ambulatory Visit: Payer: Self-pay | Admitting: Internal Medicine

## 2023-05-10 DIAGNOSIS — E119 Type 2 diabetes mellitus without complications: Secondary | ICD-10-CM

## 2023-05-16 ENCOUNTER — Other Ambulatory Visit: Payer: Medicare Other

## 2023-05-20 DIAGNOSIS — M419 Scoliosis, unspecified: Secondary | ICD-10-CM | POA: Insufficient documentation

## 2023-05-21 ENCOUNTER — Ambulatory Visit: Payer: Medicare Other | Admitting: Allergy & Immunology

## 2023-06-01 ENCOUNTER — Other Ambulatory Visit: Payer: Self-pay | Admitting: Internal Medicine

## 2023-06-01 DIAGNOSIS — F419 Anxiety disorder, unspecified: Secondary | ICD-10-CM

## 2023-06-05 NOTE — Progress Notes (Signed)
VIALS EXP 06-04-24

## 2023-06-06 ENCOUNTER — Ambulatory Visit: Payer: Self-pay

## 2023-06-06 DIAGNOSIS — J309 Allergic rhinitis, unspecified: Secondary | ICD-10-CM

## 2023-06-07 ENCOUNTER — Other Ambulatory Visit: Payer: Self-pay | Admitting: Internal Medicine

## 2023-06-07 DIAGNOSIS — E119 Type 2 diabetes mellitus without complications: Secondary | ICD-10-CM

## 2023-06-09 ENCOUNTER — Other Ambulatory Visit: Payer: Self-pay | Admitting: Podiatry

## 2023-06-09 DIAGNOSIS — J3081 Allergic rhinitis due to animal (cat) (dog) hair and dander: Secondary | ICD-10-CM | POA: Diagnosis not present

## 2023-06-10 DIAGNOSIS — J3089 Other allergic rhinitis: Secondary | ICD-10-CM | POA: Diagnosis not present

## 2023-06-17 ENCOUNTER — Encounter: Payer: Self-pay | Admitting: Internal Medicine

## 2023-06-17 ENCOUNTER — Ambulatory Visit (INDEPENDENT_AMBULATORY_CARE_PROVIDER_SITE_OTHER): Payer: Medicare Other | Admitting: Internal Medicine

## 2023-06-17 ENCOUNTER — Ambulatory Visit: Payer: Medicare Other

## 2023-06-17 VITALS — BP 108/71 | HR 85 | Ht 68.0 in | Wt 207.2 lb

## 2023-06-17 DIAGNOSIS — Z7985 Long-term (current) use of injectable non-insulin antidiabetic drugs: Secondary | ICD-10-CM | POA: Diagnosis not present

## 2023-06-17 DIAGNOSIS — F5101 Primary insomnia: Secondary | ICD-10-CM | POA: Diagnosis not present

## 2023-06-17 DIAGNOSIS — E119 Type 2 diabetes mellitus without complications: Secondary | ICD-10-CM | POA: Diagnosis not present

## 2023-06-17 MED ORDER — TIRZEPATIDE 5 MG/0.5ML ~~LOC~~ SOAJ: 5.0000 mg | SUBCUTANEOUS | 1 refills | Status: DC

## 2023-06-17 MED ORDER — TRAZODONE HCL 50 MG PO TABS
25.0000 mg | ORAL_TABLET | Freq: Every evening | ORAL | 3 refills | Status: DC | PRN
Start: 2023-06-17 — End: 2023-07-10

## 2023-06-17 NOTE — Patient Instructions (Signed)
It was a pleasure to see you today.  Thank you for giving [place] the opportunity to be involved in your care.  Below is a brief recap of your visit and next steps.  We will plan to see you again in 3 months.  Summary Increase mounjaro to 5 mg weekly Start trazodone 50 mg nightly as needed for sleep Follow up in 3 months

## 2023-06-17 NOTE — Progress Notes (Signed)
Established Patient Office Visit  Subjective   Patient ID: Brooke Farrell, female    DOB: 1955/12/12  Age: 67 y.o. MRN: [id number]  Chief Complaint  Patient presents with   Diabetes    Follow up   Brooke Farrell returns care today for routine follow-up.  She was last evaluated by me on 5/14.  Lisinopril, atorvastatin, and Mounjaro were started at that time in the setting of diabetes mellitus.  7-month follow-up was arranged.  In the interim, she has been seen by neurology and podiatry.  There have otherwise been no acute interval events.  Brooke Farrell reports feeling well today.  She is asymptomatic currently.  Her her acute concern is insomnia.  This has previously been treated with Ambien, but she would like to stop this medication and has reduced frequency to 2-3 times per week.  She is interested in starting trazodone as this has been effective for her sister.  Past Medical History:  Diagnosis Date   Acid reflux    Allergy    Anxiety    Asthma    Cataract    Depression    Diabetes mellitus without complication (HCC)    Encounter for general adult medical examination with abnormal findings 12/12/2022   Hypertension    Sleep apnea    Past Surgical History:  Procedure Laterality Date   AUGMENTATION MAMMAPLASTY  1985   CESAREAN SECTION     EYE SURGERY Bilateral 05/01/2022   eyelid surgery   JOINT REPLACEMENT     NO PAST SURGERIES     TOTAL HIP ARTHROPLASTY  01/2020   dr. Despina Hick   Social History   Tobacco Use   Smoking status: Never   Smokeless tobacco: Never  Vaping Use   Vaping status: Never Used  Substance Use Topics   Alcohol use: Yes    Comment: two drinks a month   Drug use: No   Family History  Problem Relation Age of Onset   Cancer Father        bone   Diabetes Father    Hypertension Father    Heart attack Father    Breast cancer Mother    Kidney disease Mother    Varicose Veins Mother    Diverticulitis Sister    Allergic rhinitis Neg Hx    Angioedema  Neg Hx    Asthma Neg Hx    Atopy Neg Hx    Eczema Neg Hx    Immunodeficiency Neg Hx    Urticaria Neg Hx    Sleep apnea Neg Hx    Allergies  Allergen Reactions   Mixed Grasses Anaphylaxis   Prednisone Other (See Comments)    Patient reports having very bad mood swings and was very angry when on this drug   Robitussin (Alcohol Free)  [Guaifenesin] Swelling   Sulfa Antibiotics Swelling   Corticosteroids    Amoxicillin Diarrhea   Review of Systems  Constitutional:  Negative for chills and fever.  HENT:  Negative for sore throat.   Respiratory:  Negative for cough and shortness of breath.   Cardiovascular:  Negative for chest pain, palpitations and leg swelling.  Gastrointestinal:  Negative for abdominal pain, blood in stool, constipation, diarrhea, nausea and vomiting.  Genitourinary:  Negative for dysuria and hematuria.  Musculoskeletal:  Negative for myalgias.  Skin:  Negative for itching and rash.  Neurological:  Negative for dizziness and headaches.  Psychiatric/Behavioral:  Negative for depression and suicidal ideas. The patient has insomnia.  Objective:     BP 108/71   Pulse 85   Ht 5\' 8"  (1.727 m)   Wt 207 lb 3.2 oz (94 kg)   SpO2 96%   BMI 31.50 kg/m  BP Readings from Last 3 Encounters:  06/17/23 108/71  04/28/23 125/81  03/18/23 136/86   Physical Exam Vitals reviewed.  Constitutional:      General: She is not in acute distress.    Appearance: Normal appearance. She is obese. She is not toxic-appearing.  HENT:     Head: Normocephalic and atraumatic.     Right Ear: External ear normal.     Left Ear: External ear normal.     Nose: Nose normal. No congestion or rhinorrhea.     Mouth/Throat:     Mouth: Mucous membranes are moist.     Pharynx: Oropharynx is clear. No oropharyngeal exudate or posterior oropharyngeal erythema.  Eyes:     General: No scleral icterus.    Extraocular Movements: Extraocular movements intact.     Conjunctiva/sclera:  Conjunctivae normal.     Pupils: Pupils are equal, round, and reactive to light.  Cardiovascular:     Rate and Rhythm: Normal rate and regular rhythm.     Pulses: Normal pulses.     Heart sounds: Normal heart sounds. No murmur heard.    No friction rub. No gallop.  Pulmonary:     Effort: Pulmonary effort is normal.     Breath sounds: Normal breath sounds. No wheezing, rhonchi or rales.  Abdominal:     General: Abdomen is flat. Bowel sounds are normal. There is no distension.     Palpations: Abdomen is soft.     Tenderness: There is no abdominal tenderness.  Musculoskeletal:        General: No swelling. Normal range of motion.     Cervical back: Normal range of motion.     Right lower leg: No edema.     Left lower leg: No edema.  Lymphadenopathy:     Cervical: No cervical adenopathy.  Skin:    General: Skin is warm and dry.     Capillary Refill: Capillary refill takes less than 2 seconds.     Coloration: Skin is not jaundiced.  Neurological:     General: No focal deficit present.     Mental Status: She is alert and oriented to person, place, and time.  Psychiatric:        Mood and Affect: Mood normal.        Behavior: Behavior normal.   Last CBC Lab Results  Component Value Date   WBC 5.7 03/18/2023   HGB 13.5 03/18/2023   HCT 40.8 03/18/2023   MCV 84 03/18/2023   MCH 27.8 03/18/2023   RDW 13.0 03/18/2023   PLT 254 03/18/2023   Last metabolic panel Lab Results  Component Value Date   GLUCOSE 95 03/18/2023   NA 139 03/18/2023   K 5.0 03/18/2023   CL 103 03/18/2023   CO2 24 03/18/2023   BUN 13 03/18/2023   CREATININE 0.92 03/18/2023   EGFR 68 03/18/2023   CALCIUM 9.5 03/18/2023   PROT 6.9 03/18/2023   ALBUMIN 4.2 03/18/2023   LABGLOB 2.7 03/18/2023   AGRATIO 1.6 03/18/2023   BILITOT 0.3 03/18/2023   ALKPHOS 64 03/18/2023   AST 40 03/18/2023   ALT 43 (H) 03/18/2023   Last lipids Lab Results  Component Value Date   CHOL 128 03/18/2023   HDL 53 03/18/2023    LDLCALC 61 03/18/2023  TRIG 70 03/18/2023   CHOLHDL 2.4 03/18/2023   Last hemoglobin A1c Lab Results  Component Value Date   HGBA1C 6.4 (H) 03/18/2023   Last thyroid functions Lab Results  Component Value Date   TSH 2.060 03/18/2023   Last vitamin D Lab Results  Component Value Date   VD25OH 48.0 03/18/2023   Last vitamin B12 and Folate Lab Results  Component Value Date   VITAMINB12 683 03/18/2023   FOLATE >20.0 03/18/2023     Assessment & Plan:   Problem List Items Addressed This Visit       Type 2 diabetes mellitus without complications (HCC) - Primary    A1c 6.4 in May.  Mounjaro 2.5 mg weekly was started at her last appointment.  She has not experienced any adverse side effects in starting this medication.  Not regularly checking her blood sugar.  She would like to increase the dose to see if that will help with weight loss. -Increase Mounjaro to 5 mg weekly -Due for diabetic eye exam.  Remaining diabetes related preventative care items are up-to-date.      Primary insomnia    Her acute concern today is insomnia.  She has previously been prescribed Ambien 10 mg nightly but would like to stop taking it.  She has reduced the frequency to 2-3 times weekly.  She is interested in starting trazodone as it has been effective for her sister. -Through shared decision making, trazodone 50 mg nightly as needed for insomnia has been prescribed today.  She does not plan to continue taking Ambien. -Follow-up in 3 months for reassessment      Return in about 3 months (around 09/17/2023).   Billie Lade, MD

## 2023-06-17 NOTE — Assessment & Plan Note (Signed)
A1c 6.4 in May.  Mounjaro 2.5 mg weekly was started at her last appointment.  She has not experienced any adverse side effects in starting this medication.  Not regularly checking her blood sugar.  She would like to increase the dose to see if that will help with weight loss. -Increase Mounjaro to 5 mg weekly -Due for diabetic eye exam.  Remaining diabetes related preventative care items are up-to-date.

## 2023-06-17 NOTE — Assessment & Plan Note (Addendum)
Her acute concern today is insomnia.  She has previously been prescribed Ambien 10 mg nightly but would like to stop taking it.  She has reduced the frequency to 2-3 times weekly.  She is interested in starting trazodone as it has been effective for her sister. -Through shared decision making, trazodone 50 mg nightly as needed for insomnia has been prescribed today.  She does not plan to continue taking Ambien. -Follow-up in 3 months for reassessment

## 2023-06-18 ENCOUNTER — Encounter: Payer: Self-pay | Admitting: Allergy & Immunology

## 2023-06-18 ENCOUNTER — Ambulatory Visit: Payer: Medicare Other | Admitting: Allergy & Immunology

## 2023-06-18 VITALS — BP 120/78 | HR 115 | Temp 97.3°F | Resp 16 | Wt 209.0 lb

## 2023-06-18 DIAGNOSIS — K219 Gastro-esophageal reflux disease without esophagitis: Secondary | ICD-10-CM

## 2023-06-18 DIAGNOSIS — J4530 Mild persistent asthma, uncomplicated: Secondary | ICD-10-CM

## 2023-06-18 DIAGNOSIS — J3089 Other allergic rhinitis: Secondary | ICD-10-CM

## 2023-06-18 DIAGNOSIS — J302 Other seasonal allergic rhinitis: Secondary | ICD-10-CM | POA: Diagnosis not present

## 2023-06-18 NOTE — Patient Instructions (Addendum)
1. Seasonal and perennial allergic rhinitis - Testing today showed: grasses, ragweed, weeds, trees, indoor molds, dust mites, cat, dog, and cockroach - Only one mold mix was positive and even that was not too reactive. - We are going to get rid of molds in your next set of vials and maximize protection against pollens, dust mites, and animals. - But we will use up what we have right now before starting the new vials. - Come every TWO WEEKS for shots.  - Continue with Flonase one spray per nostril daily as needed.  - Continue with Astelin two sprays per nostril twice daily.  - Continue with Singulair 10mg  daily.  - Continue with Zyrtec daily and chlorpheniramine as needed for breakthrough symptoms.   2. Mild persistent asthma, uncomplicated - Lung testing not done today.  - We are not going to make any changes at this time.  - Daily controller medication(s): Singulair 10mg  daily - Prior to physical activity: albuterol 2 puffs 10-15 minutes before physical activity. - Rescue medications: albuterol 4 puffs every 4-6 hours as needed - Asthma control goals:  * Full participation in all desired activities (may need albuterol before activity) * Albuterol use two time or less a week on average (not counting use with activity) * Cough interfering with sleep two time or less a month * Oral steroids no more than once a year * No hospitalizations  3. Gastroesophageal reflux disease - Continue with pantoprazole 40mg  daily as needed.  4. Return in about 6 months (around 12/19/2023). You can have the follow up appointment with Dr. Dellis Anes or a Nurse Practicioner (our Nurse Practitioners are excellent and always have Physician oversight!).    Please inform us of any Emergency Department visits, hospitalizations, or changes in symptoms. Call us before going to the ED for breathing or allergy symptoms since we might be able to fit you in for a sick visit. Feel free to contact us anytime with any  questions, problems, or concerns.  It was a pleasure to see you again today! We have TOO MUCH fun together!   Websites that have reliable patient information: 1. American Academy of Asthma, Allergy, and Immunology: www.aaaai.org 2. Food Allergy Research and Education (FARE): foodallergy.org 3. Mothers of Asthmatics: http://www.asthmacommunitynetwork.org 4. American College of Allergy, Asthma, and Immunology: www.acaai.org   COVID-19 Vaccine Information can be found at: [email or web address] For questions related to vaccine distribution or appointments, please email [email or web address] or call [phone].   We realize that you might be concerned about having an allergic reaction to the COVID19 vaccines. To help with that concern, WE ARE OFFERING THE COVID19 VACCINES IN OUR OFFICE! Ask the front desk for dates!     "Like" [place] on Facebook and Instagram for our latest updates!      A healthy democracy works best when [employer] participate! Make sure you are registered to vote! If you have moved or changed any of your contact information, you will need to get this updated before voting!  In some cases, you MAY be able to register to vote online: [email or web address]         Airborne Adult Perc - 06/18/23 1003     Time Antigen Placed 0945    Allergen Manufacturer Waynette Buttery    Location Back    Number of Test 54    1. Control-Buffer 50% Glycerol Negative    2. Control-Histamine 2+    3. Bahia 2+    4. [place] Negative    5. Johnson Negative  6. [facility] Negative    7. Meadow Fescue 2+    8. Perennial Rye 2+    9. Timothy 2+    10. Ragweed Mix Negative    11. Cocklebur Negative    12. Plantain,  English Negative    13. Baccharis Negative    14. Dog Fennel Negative    15. Russian Thistle Negative    16. Lamb's Quarters Negative    17. Sheep Sorrell 2+    18. Rough Pigweed Negative     19. Marsh Elder, Rough 2+    20. Mugwort, Common Negative    21. Box, Elder Negative    22. Cedar, red 2+    23. Sweet Gum 2+    24. Pecan Pollen 4+    25. Pine Mix 4+    26. Walnut, Black Pollen Negative    27. Red Mulberry Negative    28. Ash Mix Negative    29. Birch Mix 3+    30. Beech American 3+    31. Cottonwood, [place] Negative    32. Hickory, White 3+    33. Maple Mix 3+    34. Oak, [place] Mix 4+    35. Sycamore Eastern 3+    36. Alternaria Alternata Negative    37. Cladosporium Herbarum Negative    38. Aspergillus Mix Negative    39. Penicillium Mix Negative    40. Bipolaris Sorokiniana (Helminthosporium) Negative    41. Drechslera Spicifera (Curvularia) Negative    42. Mucor Plumbeus Negative    43. Fusarium Moniliforme Negative    44. Aureobasidium Pullulans (pullulara) Negative    45. Rhizopus Oryzae Negative    46. Botrytis Cinera Negative    47. Epicoccum Nigrum Negative    49. Dust Mite Mix Negative    50. Cat Hair 10,000 BAU/ml Negative    51.  Dog Epithelia Negative    52. Mixed Feathers Negative    53. Horse Epithelia Negative    54. Cockroach, German Negative    55. Tobacco Leaf Negative             Intradermal - 06/18/23 1003     Time Antigen Placed 1015    Allergen Manufacturer Waynette Buttery    Location Arm    Number of Test 12    Control Negative    [place] 4+    Johnson 4+    Ragweed Mix 4+    Mold 1 Negative    Mold 2 Negative    Mold 3 Negative    Mold 4 2+    Mite Mix 3+    Cat 2+    Dog 3+    Cockroach 4+

## 2023-06-18 NOTE — Progress Notes (Signed)
FOLLOW UP  Date of Service/Encounter:  06/18/23   Assessment:   Seasonal and perennial allergic rhinitis (grasses, weeds, ragweed, trees, indoor mold, dust mites, cats, dogs, roach) - with worsening symptoms during this time of the year    Maintenance reached December 2020 on current vials    Mild intermittent asthma, uncomplicated   Fully vaccinated to COVID-19    Plan/Recommendations:   1. Seasonal and perennial allergic rhinitis - Testing today showed: grasses, ragweed, weeds, trees, indoor molds, dust mites, cat, dog, and cockroach - Only one mold mix was positive and even that was not too reactive. - We are going to get rid of molds in your next set of vials and maximize protection against pollens, dust mites, and animals. - But we will use up what we have right now before starting the new vials. - Come every TWO WEEKS for shots.  - Continue with Flonase one spray per nostril daily as needed.  - Continue with Astelin two sprays per nostril twice daily.  - Continue with Singulair 10mg  daily.  - Continue with Zyrtec daily and chlorpheniramine as needed for breakthrough symptoms.   2. Mild persistent asthma, uncomplicated - Lung testing not done today.  - We are not going to make any changes at this time.  - Daily controller medication(s): Singulair 10mg  daily - Prior to physical activity: albuterol 2 puffs 10-15 minutes before physical activity. - Rescue medications: albuterol 4 puffs every 4-6 hours as needed - Asthma control goals:  * Full participation in all desired activities (may need albuterol before activity) * Albuterol use two time or less a week on average (not counting use with activity) * Cough interfering with sleep two time or less a month * Oral steroids no more than once a year * No hospitalizations  3. Gastroesophageal reflux disease - Continue with pantoprazole 40mg  daily as needed.  4. Return in about 6 months (around 12/19/2023). You can have  the follow up appointment with Dr. Dellis Farrell or a Nurse Practicioner (our Nurse Practitioners are excellent and always have Physician oversight!).    Subjective:   Brooke Farrell is a 67 y.o. 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   Fatty liver 04/28/2023   Flatulence, eructation and gas pain 04/28/2023   Gastroesophageal reflux disease 04/28/2023   Obesity 04/28/2023   Rectal bleeding 04/28/2023   Eczema 03/18/2023   Encounter for general adult medical examination with abnormal findings 12/12/2022   Low back pain 12/02/2022   Type 2 diabetes mellitus without complications (HCC) 11/19/2022   Anxiety and depression 11/19/2022   Cholelithiasis without obstruction 12/16/2021   Duodenitis 12/16/2021   Dysphagia 12/16/2021   Pharyngeal dysphagia 12/16/2021   Onychomycosis 11/21/2021   Primary insomnia 09/14/2019   OSA on CPAP 09/10/2018   Seasonal and perennial allergic rhinitis 08/26/2018   Mild intermittent asthma, uncomplicated 08/26/2018   IBS (irritable bowel syndrome) 03/02/2018   Gastroesophageal reflux disease with esophagitis 01/16/2015   H/O bilateral breast implants 01/21/2013   HPV (human papilloma virus) anogenital infection 05/25/2012    History obtained from: chart review and patient.  Brooke Farrell is a 67 y.o.  female presenting for a follow up visit.  He was last seen in April 2024.  At that time, we started her on a prednisone taper for her allergic rhinitis and continue with allergy shots.  We continue with Flonase and Astelin as well as Singulair and Zyrtec.  Her lung testing was not done.  We continue with Singulair daily and albuterol as needed.  GERD was  controlled with pantoprazole.  She remained on Astelin twice daily as well as Zyrtec and chlorpheniramine every 4 hours during the day.  She did feel like her shots are working at some point, but felt they were not as effective.  Since the last visit, she has mostly done well. She is stressed with her insurance issues. Apparently they are not paying from a pap smear from two years ago. She is having some issues with getting new tires as well.   Allergic Rhinitis Symptom History: She is always in a bad spot in April, but the rest of the year is good. She does not need to use cetirizine throughout the year, only intermittently (aside from Benadryl). She has not used anything since Sunday. She is tired, but not miserable without it.   Brooke Farrell is on allergen immunotherapy. She receives two injections. Immunotherapy script #1 contains trees, weeds, grasses, cat, and dog. She currently receives 0.68mL of the RED vial (1/100). Immunotherapy script #2 contains  ragweed, molds, dust mites, and cockroach. She currently receives 0.70mL of the RED vial (1/100). She started shots June of 2020 and reached maintenance in December of 2020.   Otherwise, there have been no changes to her past medical history, surgical history, family history, or social history.    Review of systems otherwise negative other than that mentioned in the HPI.    Objective:   Blood pressure 120/78, pulse (!) 115, temperature (!) 97.3 F (36.3 C), resp. rate 16, weight 209 lb (94.8 kg), SpO2 95%. Body mass index is 31.78 kg/m.    Physical Exam Vitals reviewed.  Constitutional:      Appearance: She is well-developed.     Comments: Talkative female. Cooperative with the exam.   HENT:     Head: Normocephalic and atraumatic.     Right Ear: Tympanic membrane, ear canal and external ear normal.     Left Ear: Tympanic membrane, ear canal and external ear normal.     Nose: No nasal deformity, septal deviation, mucosal edema or  rhinorrhea.     Right Turbinates: Enlarged, swollen and pale.     Left Turbinates: Enlarged, swollen and pale.     Right Sinus: No maxillary sinus tenderness or frontal sinus tenderness.     Left Sinus: No maxillary sinus tenderness or frontal sinus tenderness.     Comments: Scant rhinorrhea today.     Mouth/Throat:     Mouth: Mucous membranes are not pale and not dry.     Pharynx: Uvula midline.     Comments: Cobblestoning present in the posterior oropharynx.  Eyes:     General: Lids are normal. Allergic shiner present.        Right eye: No discharge.        Left eye: No discharge.     Conjunctiva/sclera: Conjunctivae normal.     Right eye: Right conjunctiva is not injected. No chemosis.    Left eye: Left conjunctiva is not injected. No chemosis.    Pupils: Pupils are equal, round, and reactive to light.  Cardiovascular:     Rate and Rhythm: Normal  rate and regular rhythm.     Heart sounds: Normal heart sounds.  Pulmonary:     Effort: Pulmonary effort is normal. No tachypnea, accessory muscle usage or respiratory distress.     Breath sounds: Normal breath sounds. No wheezing, rhonchi or rales.     Comments: Moving air well in all lung fields. No increased work of breathing noted. Chest:     Chest wall: No tenderness.  Lymphadenopathy:     Cervical: No cervical adenopathy.  Skin:    Coloration: Skin is not pale.     Findings: No abrasion, erythema, petechiae or rash. Rash is not papular, urticarial or vesicular.  Neurological:     Mental Status: She is alert.  Psychiatric:        Behavior: Behavior is cooperative.      Diagnostic studies:   Allergy Studies:     Airborne Adult Perc - 06/18/23 1003     Time Antigen Placed 0945    Allergen Manufacturer Waynette Buttery    Location Back    Number of Test 54    1. Control-Buffer 50% Glycerol Negative    2. Control-Histamine 2+    3. Bahia 2+    4. [place] Negative    5. Johnson Negative    6. [place] Negative    7. Meadow  Fescue 2+    8. Perennial Rye 2+    9. Timothy 2+    10. Ragweed Mix Negative    11. Cocklebur Negative    12. Plantain,  English Negative    13. Baccharis Negative    14. Dog Fennel Negative    15. Russian Thistle Negative    16. Lamb's Quarters Negative    17. Sheep Sorrell 2+    18. Rough Pigweed Negative    19. Marsh Elder, Rough 2+    20. Mugwort, Common Negative    21. Box, Elder Negative    22. Cedar, red 2+    23. Sweet Gum 2+    24. Pecan Pollen 4+    25. Pine Mix 4+    26. Walnut, Black Pollen Negative    27. Red Mulberry Negative    28. Ash Mix Negative    29. Birch Mix 3+    30. Beech American 3+    31. Cottonwood, [place]iss[place] Negative    32. Hickory, White 3+    33. Maple Mix 3+    34. Oak, [place] Mix 4+    35. Sycamore Eastern 3+    36. Alternaria Alternata Negative    37. Cladosporium Herbarum Negative    38. Aspergillus Mix Negative    39. Penicillium Mix Negative    40. Bipolaris Sorokiniana (Helminthosporium) Negative    41. Drechslera Spicifera (Curvularia) Negative    42. Mucor Plumbeus Negative    43. Fusarium Moniliforme Negative    44. Aureobasidium Pullulans (pullulara) Negative    45. Rhizopus Oryzae Negative    46. Botrytis Cinera Negative    47. Epicoccum Nigrum Negative    49. Dust Mite Mix Negative    50. Cat Hair 10,000 BAU/ml Negative    51.  Dog Epithelia Negative    52. Mixed Feathers Negative    53. Horse Epithelia Negative    54. Cockroach, German Negative    55. Tobacco Leaf Negative             Intradermal - 06/18/23 1003     Time Antigen Placed 1015    Allergen Manufacturer Waynette Buttery    Location  Arm    Number of Test 12    Control Negative    [place] 4+    Johnson 4+    Ragweed Mix 4+    Mold 1 Negative    Mold 2 Negative    Mold 3 Negative    Mold 4 2+    Mite Mix 3+    Cat 2+    Dog 3+    Cockroach 4+             Allergy testing results were read and interpreted by myself, documented by clinical  staff.      Malachi Bonds, MD  [facility]

## 2023-06-19 NOTE — Progress Notes (Signed)
Aeroallergen Immunotherapy   Ordering Provider: Dr. Malachi Bonds   Patient Details  Name: Brooke Farrell  MRN: [id number]  Date of Birth: 02-Feb-1956   Order 2 of 2   Vial Label: RW/DM/CR   0.6 ml (Volume)  1:20 Concentration -- Ragweed Mix  0.3 ml (Volume)  1:20 Concentration -- Cockroach, German  1.0 ml (Volume)   AU Concentration -- Mite Mix (DF 5,000 & DP 5,000)    1.9  ml Extract Subtotal  3.1  ml Diluent  5.0  ml Maintenance Total   Schedule:  A   Red Vial (1:100): Schedule A (14 doses)   Special Instructions: After completion of the first Red Vial, please space to every two weeks indefinitely. Ok to up dose new vials at 0.67mL --> 0.3 mL --> 0.5 mL.

## 2023-06-19 NOTE — Progress Notes (Signed)
MAKE WHEN SHE NEEDS NEW VIALS

## 2023-06-19 NOTE — Progress Notes (Signed)
Aeroallergen Immunotherapy  Ordering Provider: Dr. Malachi Bonds  Patient Details Name: Brooke Farrell MRN: [id number] Date of Birth: 17-Aug-1956  Order 1 of 2  Vial Label: G/W/T/C/D  0.3 ml (Volume)  BAU Concentration -- 7 Grass Mix* 100,000 ([address] [place], [place], [place], [place] Rye, RedTop, Sweet Vernal, Timothy) 0.2 ml (Volume)  1:20 Concentration -- Bahia 0.3 ml (Volume)  BAU Concentration -- [place] 10,000 0.2 ml (Volume)  1:20 Concentration -- Johnson 0.2 ml (Volume)  1:10 Concentration -- Sheep Sorrell* 0.2 ml (Volume)  1:20 Concentration -- Marsh elder, Rough* 0.5 ml (Volume)  1:20 Concentration -- Eastern 10 Tree Mix (also Sweet Gum) 0.1 ml (Volume)  1:10 Concentration -- Birch mix* 0.1 ml (Volume)  1:20 Concentration -- [place] American* 0.2 ml (Volume)  1:10 Concentration -- Cedar, red 0.1 ml (Volume)  1:10 Concentration -- Hickory* 0.1 ml (Volume)  1:20 Concentration -- Maple Mix* 0.1 ml (Volume)  1:10 Concentration -- Oak, [place]iss[place] mix* 0.2 ml (Volume)  1:10 Concentration -- Pecan Pollen 0.2 ml (Volume)  1:10 Concentration -- Pine Mix 0.1 ml (Volume)  1:10 Concentration -- Sycamore Eastern* 0.2 ml (Volume)  1:20 Concentration -- Sweet Gum 0.7 ml (Volume)  1:10 Concentration -- Cat Hair 0.7 ml (Volume)  1:10 Concentration -- Dog Epithelia   4.7  ml Extract Subtotal 0.3  ml Diluent 5.0  ml Maintenance Total  Schedule:  A  Red Vial (1:100): Schedule A (14 doses)  Special Instructions: After completion of the first Red Vial, please space to every two weeks indefinitely. Ok to up dose new vials at 0.31mL --> 0.3 mL --> 0.5 mL.

## 2023-06-20 ENCOUNTER — Ambulatory Visit: Payer: Medicare Other

## 2023-06-25 ENCOUNTER — Ambulatory Visit: Payer: Self-pay

## 2023-06-25 DIAGNOSIS — J309 Allergic rhinitis, unspecified: Secondary | ICD-10-CM

## 2023-06-27 ENCOUNTER — Ambulatory Visit
Admission: RE | Admit: 2023-06-27 | Discharge: 2023-06-27 | Disposition: A | Discharge status: Home / Self Care | Payer: Medicare Other | Source: Ambulatory Visit | Attending: Family Medicine | Admitting: Family Medicine

## 2023-06-27 VITALS — BP 115/79 | HR 96 | Temp 98.2°F | Resp 18

## 2023-06-27 DIAGNOSIS — J069 Acute upper respiratory infection, unspecified: Secondary | ICD-10-CM | POA: Insufficient documentation

## 2023-06-27 DIAGNOSIS — Z20822 Contact with and (suspected) exposure to covid-19: Secondary | ICD-10-CM | POA: Insufficient documentation

## 2023-06-27 MED ORDER — PROMETHAZINE-DM 6.25-15 MG/5ML PO SYRP: 5.0000 mL | ORAL_SOLUTION | Freq: Four times a day (QID) | ORAL | 0 refills | Status: DC | PRN

## 2023-06-27 MED ORDER — MOLNUPIRAVIR EUA 200MG CAPSULE: 4.0000 | ORAL_CAPSULE | Freq: Two times a day (BID) | ORAL | 0 refills | Status: AC

## 2023-06-27 NOTE — ED Provider Notes (Signed)
[facility]    CSN: [id number] Arrival date & time: 06/27/23  1139      History   Chief Complaint Chief Complaint  Patient presents with   Cough    sore throat - Entered by patient    HPI Brooke Farrell is a 67 y.o. female.   Patient presenting today with 1 day history of sore throat, congestion, cough.  Denies fever, chills, chest pain, shortness of breath, abdominal pain, nausea vomiting or diarrhea.  Has been tested positive for COVID yesterday.  Taking Singulair and Zyrtec with no relief.  History of asthma on albuterol as needed.    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   Fatty liver 04/28/2023   Flatulence, eructation and gas pain 04/28/2023   Gastroesophageal reflux disease 04/28/2023   Obesity 04/28/2023   Rectal bleeding 04/28/2023   Eczema 03/18/2023   Encounter for general adult medical examination with abnormal findings 12/12/2022   Low back pain 12/02/2022   Type 2 diabetes mellitus without complications (HCC) 11/19/2022   Anxiety and depression 11/19/2022   Cholelithiasis without obstruction 12/16/2021   Duodenitis 12/16/2021   Dysphagia 12/16/2021   Pharyngeal dysphagia 12/16/2021   Onychomycosis 11/21/2021   Primary insomnia 09/14/2019   OSA on CPAP 09/10/2018   Seasonal and perennial allergic rhinitis 08/26/2018   Mild intermittent asthma, uncomplicated 08/26/2018   IBS (irritable bowel syndrome) 03/02/2018   Gastroesophageal reflux disease with esophagitis 01/16/2015   H/O bilateral breast implants  01/21/2013   HPV (human papilloma virus) anogenital infection 05/25/2012    Past Surgical History:  Procedure Laterality Date   AUGMENTATION MAMMAPLASTY  1985   CESAREAN SECTION     EYE SURGERY Bilateral 05/01/2022   eyelid surgery   JOINT REPLACEMENT     NO PAST SURGERIES     TOTAL HIP ARTHROPLASTY  01/2020   dr. Despina Hick    OB History     Gravida  3   Para  1   Term  1   Preterm      AB  2   Living  1      SAB      IAB      Ectopic      Multiple      Live Births  1            Home Medications    Prior to Admission medications   Medication Sig Start Date End Date Taking? Authorizing Provider  molnupiravir EUA (LAGEVRIO) 200 mg CAPS capsule Take 4 capsules (800 mg total) by mouth 2 (two) times daily for 5 days. 06/27/23 07/02/23 Yes Particia Nearing, PA-C  promethazine-dextromethorphan (PROMETHAZINE-DM) 6.25-15 MG/5ML syrup Take 5 mLs by mouth 4 (four) times daily as needed. 06/27/23  Yes Particia Nearing, PA-C  albuterol (VENTOLIN HFA) 108 (90 Base) MCG/ACT inhaler Inhale 2 puffs into the lungs every 4 (four) hours as needed for wheezing or shortness of breath. 02/19/23   Alfonse Spruce,  MD  atorvastatin (LIPITOR) 10 MG tablet Take 1 tablet (10 mg total) by mouth daily. 03/18/23   Billie Lade, MD  azelastine (ASTELIN) 0.1 % nasal spray Place 1 spray into both nostrils daily. Use in each nostril as directed 02/19/23   Alfonse Spruce, MD  cetirizine (ZYRTEC) 10 MG tablet Take 1 tablet (10 mg total) by mouth daily as needed. 02/19/23 06/17/23  Alfonse Spruce, MD  cyclobenzaprine (FLEXERIL) 10 MG tablet Take 10 mg by mouth at bedtime. 12/02/22   [provider]  Efinaconazole 10 % SOLN Apply 1 drop topically daily. 09/05/22   Vivi Barrack, DPM  FLUoxetine (PROZAC) 20 MG capsule TAKE 1 CAPSULE BY MOUTH EVERY DAY 06/02/23   Billie Lade, MD  fluticasone [facility]) 50 MCG/ACT nasal spray Place 2 sprays into both  nostrils daily. 02/19/23   Alfonse Spruce, MD  lisinopril (ZESTRIL) 5 MG tablet Take 1 tablet (5 mg total) by mouth daily. 03/18/23   Billie Lade, MD  meloxicam (MOBIC) 7.5 MG tablet Take 7.5 mg by mouth daily. Patient not taking: Reported on 06/17/2023 05/16/23   [provider]  montelukast (SINGULAIR) 10 MG tablet Take 1 tablet (10 mg total) by mouth at bedtime. 02/19/23 06/17/23  Alfonse Spruce, MD  pantoprazole (PROTONIX) 40 MG tablet Take 1 tablet (40 mg total) by mouth daily. 10/22/21   Ambs, Norvel Richards, FNP  tirzepatide [facility]) 5 MG/0.5ML Pen Inject 5 mg into the skin once a week. 06/17/23   Billie Lade, MD  traZODone (DESYREL) 50 MG tablet Take 0.5-1 tablets (25-50 mg total) by mouth at bedtime as needed for sleep. 06/17/23   Billie Lade, MD  triamcinolone (KENALOG) 0.025 % ointment Apply 1 Application topically 2 (two) times daily. 03/18/23   Billie Lade, MD    Family History Family History  Problem Relation Age of Onset   Cancer Father        bone   Diabetes Father    Hypertension Father    Heart attack Father    Breast cancer Mother    Kidney disease Mother    Varicose Veins Mother    Diverticulitis Sister    Allergic rhinitis Neg Hx    Angioedema Neg Hx    Asthma Neg Hx    Atopy Neg Hx    Eczema Neg Hx    Immunodeficiency Neg Hx    Urticaria Neg Hx    Sleep apnea Neg Hx     Social History Social History   Tobacco Use   Smoking status: Never   Smokeless tobacco: Never  Vaping Use   Vaping status: Never Used  Substance Use Topics   Alcohol use: Yes    Comment: two drinks a month   Drug use: No     Allergies   Mixed grasses, Prednisone, Robitussin (alcohol free)  [guaifenesin], Sulfa antibiotics, Corticosteroids, and Amoxicillin   Review of Systems Review of Systems Per HPI  Physical Exam Triage Vital Signs ED Triage Vitals  Encounter Vitals Group     BP 06/27/23 1142 115/79     Systolic BP Percentile --       Diastolic BP Percentile --      Pulse Rate 06/27/23 1142 96     Resp 06/27/23 1142 18     Temp 06/27/23 1142 98.2 F (36.8 C)     Temp Source 06/27/23 1142 Oral     SpO2 06/27/23 1142 95 %     Weight --  Height --      Head Circumference --      Peak Flow --      Pain Score 06/27/23 1143 6     Pain Loc --      Pain Education --      Exclude from Growth Chart --    No data found.  Updated Vital Signs BP 115/79 (BP Location: Right Arm)   Pulse 96   Temp 98.2 F (36.8 C) (Oral)   Resp 18   SpO2 95%   Visual Acuity Right Eye Distance:   Left Eye Distance:   Bilateral Distance:    Right Eye Near:   Left Eye Near:    Bilateral Near:     Physical Exam Vitals and nursing note reviewed.  Constitutional:      Appearance: Normal appearance.  HENT:     Head: Atraumatic.     Right Ear: Tympanic membrane and external ear normal.     Left Ear: Tympanic membrane and external ear normal.     Nose: Rhinorrhea present.     Mouth/Throat:     Mouth: Mucous membranes are moist.     Pharynx: Posterior oropharyngeal erythema present.  Eyes:     Extraocular Movements: Extraocular movements intact.     Conjunctiva/sclera: Conjunctivae normal.  Cardiovascular:     Rate and Rhythm: Normal rate and regular rhythm.     Heart sounds: Normal heart sounds.  Pulmonary:     Effort: Pulmonary effort is normal.     Breath sounds: Normal breath sounds. No wheezing.  Musculoskeletal:        General: Normal range of motion.     Cervical back: Normal range of motion and neck supple.  Skin:    General: Skin is warm and dry.  Neurological:     Mental Status: She is alert and oriented to person, place, and time.  Psychiatric:        Mood and Affect: Mood normal.        Thought Content: Thought content normal.      UC Treatments / Results  Labs (all labs ordered are listed, but only abnormal results are displayed) Labs Reviewed  SARS CORONAVIRUS 2 (TAT 6-24 HRS)     EKG   Radiology No results found.  Procedures Procedures (including critical care time)  Medications Ordered in UC Medications - No data to display  Initial Impression / Assessment and Plan / UC Course  I have reviewed the triage vital signs and the nursing notes.  Pertinent labs & imaging results that were available during my care of the patient were reviewed by me and considered in my medical decision making (see chart for details).     COVID test pending, given home exposure and consistent symptoms we will start molnupiravir while awaiting results for confirmation.  Phenergan DM, supportive care medications and home care reviewed as well.  Return for worsening symptoms.  Final Clinical Impressions(s) / UC Diagnoses   Final diagnoses:  Viral URI with cough  Exposure to COVID-19 virus   Discharge Instructions   None    ED Prescriptions     Medication Sig Dispense Auth. Provider   molnupiravir EUA (LAGEVRIO) 200 mg CAPS capsule Take 4 capsules (800 mg total) by mouth 2 (two) times daily for 5 days. 40 capsule Particia Nearing, [place]   promethazine-dextromethorphan (PROMETHAZINE-DM) 6.25-15 MG/5ML syrup Take 5 mLs by mouth 4 (four) times daily as needed. 100 mL Particia Nearing, [place]  PDMP not reviewed this encounter.   Particia Nearing, [place] 06/27/23 1922

## 2023-06-27 NOTE — ED Triage Notes (Signed)
Sore throat, nasal congestion, cough since yesterday.  Has been taking singular and zyrtec.

## 2023-06-28 LAB — SARS CORONAVIRUS 2 (TAT 6-24 HRS): SARS Coronavirus 2: NEGATIVE

## 2023-06-30 ENCOUNTER — Ambulatory Visit: Payer: Medicare Other | Admitting: Podiatry

## 2023-07-10 ENCOUNTER — Other Ambulatory Visit: Payer: Self-pay | Admitting: Internal Medicine

## 2023-07-10 DIAGNOSIS — F5101 Primary insomnia: Secondary | ICD-10-CM

## 2023-07-16 ENCOUNTER — Ambulatory Visit (INDEPENDENT_AMBULATORY_CARE_PROVIDER_SITE_OTHER): Payer: Self-pay

## 2023-07-16 DIAGNOSIS — J309 Allergic rhinitis, unspecified: Secondary | ICD-10-CM

## 2023-07-28 ENCOUNTER — Ambulatory Visit: Payer: Medicare Other | Admitting: Podiatry

## 2023-07-30 ENCOUNTER — Ambulatory Visit (INDEPENDENT_AMBULATORY_CARE_PROVIDER_SITE_OTHER): Payer: Medicare Other

## 2023-07-30 DIAGNOSIS — J309 Allergic rhinitis, unspecified: Secondary | ICD-10-CM | POA: Diagnosis not present

## 2023-08-12 ENCOUNTER — Encounter: Payer: Self-pay | Admitting: Podiatry

## 2023-08-12 ENCOUNTER — Ambulatory Visit (INDEPENDENT_AMBULATORY_CARE_PROVIDER_SITE_OTHER): Payer: Medicare Other | Admitting: Podiatry

## 2023-08-12 DIAGNOSIS — M79675 Pain in left toe(s): Secondary | ICD-10-CM | POA: Diagnosis not present

## 2023-08-12 DIAGNOSIS — M79674 Pain in right toe(s): Secondary | ICD-10-CM | POA: Diagnosis not present

## 2023-08-12 DIAGNOSIS — B351 Tinea unguium: Secondary | ICD-10-CM

## 2023-08-12 NOTE — Progress Notes (Signed)
Subjective: Chief Complaint  Patient presents with   Nail Problem    Possible fungus     67 year old female presents the above concerns.  She is been using the Jublia on the toenails.  She presents today as a health interim as they are elongated.  She is considering laser again but wants to wait after the first year to do anything else.  Objective: AAO x3, NAD DP/PT pulses palpable bilaterally, CRT less than 3 seconds Sensation intact with Semmes 20 monofilament. Nails are hypertrophic, dystrophic with yellow discoloration.  There is no edema, erythema.  She does get tenderness nails 1-5 bilaterally as they become elongated. No pain with calf compression, swelling, warmth, erythema  Assessment: Symptomatic onychomycosis  Plan: -All treatment options discussed with the patient including all alternatives, risks, complications.  -Sharp debridement nails x 10 without any complications or bleeding.  Continue Jublia.  Will reconsider laser therapy after the first of the year. -Daily foot inspection  Return if symptoms worsen or fail to improve.  Brooke Farrell DPM

## 2023-08-13 ENCOUNTER — Ambulatory Visit (INDEPENDENT_AMBULATORY_CARE_PROVIDER_SITE_OTHER): Payer: Medicare Other

## 2023-08-13 DIAGNOSIS — J309 Allergic rhinitis, unspecified: Secondary | ICD-10-CM | POA: Diagnosis not present

## 2023-08-24 ENCOUNTER — Ambulatory Visit
Admission: RE | Admit: 2023-08-24 | Discharge: 2023-08-24 | Disposition: A | Discharge status: Home / Self Care | Payer: Medicare Other | Source: Ambulatory Visit | Attending: Internal Medicine | Admitting: Internal Medicine

## 2023-08-24 VITALS — BP 131/84 | HR 80 | Temp 98.0°F | Resp 17 | Ht 68.0 in | Wt 200.0 lb

## 2023-08-24 DIAGNOSIS — R252 Cramp and spasm: Secondary | ICD-10-CM | POA: Diagnosis not present

## 2023-08-24 NOTE — ED Triage Notes (Signed)
Pt states that she has some bilateral leg pain. X2 days

## 2023-08-24 NOTE — ED Provider Notes (Signed)
[facility]    CSN: [id number] Arrival date & time: 08/24/23  1143      History   Chief Complaint Chief Complaint  Patient presents with   Dizziness    my lisinopril ormy atorvastatin  causing me leg weakness - Entered by patient    HPI Brooke Farrell is a 67 y.o. female.   Patient presenting today with ongoing worsening issues with leg cramping and spasms.  She initially thought these were related to her ongoing back issues for which she is seeing a neurosurgeon in the near future but is now realizing it may relate to her atorvastatin.  She states she was taken off of statins years ago because of the muscle cramping related to them, restarted in May and has been having some issues since but the issues have worsened over the past week.  She stopped taking it yesterday and does note that her symptoms are a bit better today.  Denies swelling, discoloration, injury to the areas, new routine or diet changes.    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   Fatty liver 04/28/2023   Flatulence, eructation and gas pain 04/28/2023   Gastroesophageal reflux disease 04/28/2023   Obesity 04/28/2023   Rectal bleeding 04/28/2023   Eczema 03/18/2023   Encounter for general adult medical examination with abnormal findings 12/12/2022   Low back pain 12/02/2022   Type 2 diabetes mellitus without complications (HCC) 11/19/2022   Anxiety and depression 11/19/2022   Cholelithiasis without obstruction 12/16/2021   Duodenitis 12/16/2021   Dysphagia  12/16/2021   Pharyngeal dysphagia 12/16/2021   Onychomycosis 11/21/2021   Primary insomnia 09/14/2019   OSA on CPAP 09/10/2018   Seasonal and perennial allergic rhinitis 08/26/2018   Mild intermittent asthma, uncomplicated 08/26/2018   IBS (irritable bowel syndrome) 03/02/2018   Gastroesophageal reflux disease with esophagitis 01/16/2015   H/O bilateral breast implants 01/21/2013   HPV (human papilloma virus) anogenital infection 05/25/2012    Past Surgical History:  Procedure Laterality Date   AUGMENTATION MAMMAPLASTY  1985   CESAREAN SECTION     EYE SURGERY Bilateral 05/01/2022   eyelid surgery   JOINT REPLACEMENT     NO PAST SURGERIES     TOTAL HIP ARTHROPLASTY  01/2020   dr. Despina Hick    OB History     Gravida  3   Para  1   Term  1   Preterm      AB  2   Living  1      SAB      IAB      Ectopic      Multiple      Live Births  1            Home Medications    Prior to Admission medications   Medication Sig Start Date End Date Taking? Authorizing Provider  atorvastatin (LIPITOR) 10 MG tablet Take 1 tablet (10 mg total) by mouth daily. 03/18/23  Yes  Billie Lade, MD  FLUoxetine (PROZAC) 20 MG capsule TAKE 1 CAPSULE BY MOUTH EVERY DAY 06/02/23  Yes Billie Lade, MD  lisinopril (ZESTRIL) 5 MG tablet Take 1 tablet (5 mg total) by mouth daily. 03/18/23  Yes Billie Lade, MD  albuterol (VENTOLIN HFA) 108 (90 Base) MCG/ACT inhaler Inhale 2 puffs into the lungs every 4 (four) hours as needed for wheezing or shortness of breath. 02/19/23   Alfonse Spruce, MD  azelastine (ASTELIN) 0.1 % nasal spray Place 1 spray into both nostrils daily. Use in each nostril as directed 02/19/23   Alfonse Spruce, MD  cetirizine (ZYRTEC) 10 MG tablet Take 1 tablet (10 mg total) by mouth daily as needed. 02/19/23 06/17/23  Alfonse Spruce, MD  cyclobenzaprine (FLEXERIL) 10 MG tablet Take 10 mg by mouth at bedtime. 12/02/22   [provider]   Efinaconazole 10 % SOLN Apply 1 drop topically daily. 09/05/22   Vivi Barrack, DPM  fluticasone (FLONASE) 50 MCG/ACT nasal spray Place 2 sprays into both nostrils daily. 02/19/23   Alfonse Spruce, MD  meloxicam (MOBIC) 7.5 MG tablet Take 7.5 mg by mouth daily. Patient not taking: Reported on 06/17/2023 05/16/23   [provider]  montelukast (SINGULAIR) 10 MG tablet Take 1 tablet (10 mg total) by mouth at bedtime. 02/19/23 06/17/23  Alfonse Spruce, MD  pantoprazole (PROTONIX) 40 MG tablet Take 1 tablet (40 mg total) by mouth daily. 10/22/21   Hetty Blend, FNP  promethazine-dextromethorphan (PROMETHAZINE-DM) 6.25-15 MG/5ML syrup Take 5 mLs by mouth 4 (four) times daily as needed. 06/27/23   Particia Nearing, PA-C  tirzepatide [facility]) 5 MG/0.5ML Pen Inject 5 mg into the skin once a week. 06/17/23   Billie Lade, MD  traZODone (DESYREL) 50 MG tablet TAKE 0.5-1 TABLET BY MOUTH AT BEDTIME AS NEEDED FOR SLEEP. 07/10/23   Billie Lade, MD  triamcinolone (KENALOG) 0.025 % ointment Apply 1 Application topically 2 (two) times daily. 03/18/23   Billie Lade, MD    Family History Family History  Problem Relation Age of Onset   Cancer Father        bone   Diabetes Father    Hypertension Father    Heart attack Father    Breast cancer Mother    Kidney disease Mother    Varicose Veins Mother    Diverticulitis Sister    Allergic rhinitis Neg Hx    Angioedema Neg Hx    Asthma Neg Hx    Atopy Neg Hx    Eczema Neg Hx    Immunodeficiency Neg Hx    Urticaria Neg Hx    Sleep apnea Neg Hx     Social History Social History   Tobacco Use   Smoking status: Never   Smokeless tobacco: Never  Vaping Use   Vaping status: Never Used  Substance Use Topics   Alcohol use: Yes    Comment: two drinks a month   Drug use: No     Allergies   Mixed grasses, Prednisone, Robitussin (alcohol free)  [guaifenesin], Sulfa antibiotics, Corticosteroids, and  Amoxicillin   Review of Systems Review of Systems Per HPI  Physical Exam Triage Vital Signs ED Triage Vitals  Encounter Vitals Group     BP 08/24/23 1228 131/84     Systolic BP Percentile --      Diastolic BP Percentile --      Pulse Rate 08/24/23 1228 80     Resp 08/24/23  1228 17     Temp 08/24/23 1228 98 F (36.7 C)     Temp Source 08/24/23 1228 Oral     SpO2 08/24/23 1228 95 %     Weight 08/24/23 1226 200 lb (90.7 kg)     Height 08/24/23 1226 5\' 8"  (1.727 m)     Head Circumference --      Peak Flow --      Pain Score 08/24/23 1226 6     Pain Loc --      Pain Education --      Exclude from Growth Chart --    No data found.  Updated Vital Signs BP 131/84 (BP Location: Right Arm)   Pulse 80   Temp 98 F (36.7 C) (Oral)   Resp 17   Ht 5\' 8"  (1.727 m)   Wt 200 lb (90.7 kg)   SpO2 95%   BMI 30.41 kg/m   Visual Acuity Right Eye Distance:   Left Eye Distance:   Bilateral Distance:    Right Eye Near:   Left Eye Near:    Bilateral Near:     Physical Exam Vitals and nursing note reviewed.  Constitutional:      Appearance: Normal appearance. She is not ill-appearing.  HENT:     Head: Atraumatic.  Eyes:     Extraocular Movements: Extraocular movements intact.     Conjunctiva/sclera: Conjunctivae normal.  Cardiovascular:     Rate and Rhythm: Normal rate and regular rhythm.     Heart sounds: Normal heart sounds.  Pulmonary:     Effort: Pulmonary effort is normal.     Breath sounds: Normal breath sounds.  Musculoskeletal:        General: No swelling, tenderness or signs of injury. Normal range of motion.     Cervical back: Normal range of motion and neck supple.  Skin:    General: Skin is warm and dry.  Neurological:     Mental Status: She is alert and oriented to person, place, and time.     Motor: No weakness.     Gait: Gait normal.     Comments: Bilateral lower extremities neurovascularly intact  Psychiatric:        Mood and Affect: Mood normal.         Thought Content: Thought content normal.        Judgment: Judgment normal.      UC Treatments / Results  Labs (all labs ordered are listed, but only abnormal results are displayed) Labs Reviewed - No data to display  EKG   Radiology No results found.  Procedures Procedures (including critical care time)  Medications Ordered in UC Medications - No data to display  Initial Impression / Assessment and Plan / UC Course  I have reviewed the triage vital signs and the nursing notes.  Pertinent labs & imaging results that were available during my care of the patient were reviewed by me and considered in my medical decision making (see chart for details).     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  May try co-Q10 to help mitigate the muscle cramping while awaiting further instruction.  Stay hydrated, stretch, muscle rubs.  Return for worsening symptoms.  Final Clinical Impressions(s) / UC Diagnoses   Final diagnoses:  Leg cramps   Discharge Instructions   None    ED Prescriptions   None    PDMP not reviewed this encounter.   Roosvelt Maser  [place], PA-C 08/24/23 1346

## 2023-08-25 ENCOUNTER — Telehealth: Payer: Self-pay | Admitting: Internal Medicine

## 2023-08-25 NOTE — Telephone Encounter (Signed)
Patient calling says she went to urgent care yesterday was instructed to stop taking her Atorvastatin- just an [place] Thanks

## 2023-08-27 ENCOUNTER — Ambulatory Visit (INDEPENDENT_AMBULATORY_CARE_PROVIDER_SITE_OTHER): Payer: Self-pay

## 2023-08-27 DIAGNOSIS — J309 Allergic rhinitis, unspecified: Secondary | ICD-10-CM | POA: Diagnosis not present

## 2023-09-10 ENCOUNTER — Other Ambulatory Visit: Payer: Self-pay | Admitting: Neurosurgery

## 2023-09-10 ENCOUNTER — Ambulatory Visit (INDEPENDENT_AMBULATORY_CARE_PROVIDER_SITE_OTHER): Payer: Medicare Other

## 2023-09-10 DIAGNOSIS — J309 Allergic rhinitis, unspecified: Secondary | ICD-10-CM | POA: Diagnosis not present

## 2023-09-10 DIAGNOSIS — M5416 Radiculopathy, lumbar region: Secondary | ICD-10-CM

## 2023-09-10 MED ORDER — EPINEPHRINE 0.3 MG/0.3ML IJ SOAJ: 0.3000 mg | INTRAMUSCULAR | 1 refills | Status: AC | PRN

## 2023-09-13 ENCOUNTER — Ambulatory Visit
Admission: RE | Admit: 2023-09-13 | Discharge: 2023-09-13 | Disposition: A | Discharge status: Home / Self Care | Payer: Medicare Other | Source: Ambulatory Visit | Attending: Neurosurgery | Admitting: Neurosurgery

## 2023-09-13 DIAGNOSIS — M5416 Radiculopathy, lumbar region: Secondary | ICD-10-CM

## 2023-09-16 DIAGNOSIS — M5416 Radiculopathy, lumbar region: Secondary | ICD-10-CM | POA: Insufficient documentation

## 2023-09-17 ENCOUNTER — Ambulatory Visit (INDEPENDENT_AMBULATORY_CARE_PROVIDER_SITE_OTHER): Payer: Self-pay

## 2023-09-17 ENCOUNTER — Telehealth: Payer: Self-pay

## 2023-09-17 DIAGNOSIS — J309 Allergic rhinitis, unspecified: Secondary | ICD-10-CM | POA: Diagnosis not present

## 2023-09-17 NOTE — Telephone Encounter (Signed)
Patient left her epipen at the [facility] office. Patient called the office back and I informed. Patient said she was not able to come today but will come on Friday to get her epipen. Epipen is upfront in the pick up drawer.

## 2023-09-17 NOTE — Telephone Encounter (Signed)
Called pt lvm that she left her Epi pen in office and to call or return to office to pick up her Epi.

## 2023-09-23 ENCOUNTER — Ambulatory Visit: Payer: Medicare Other | Admitting: Internal Medicine

## 2023-09-24 ENCOUNTER — Ambulatory Visit (INDEPENDENT_AMBULATORY_CARE_PROVIDER_SITE_OTHER): Payer: Medicare Other

## 2023-09-24 DIAGNOSIS — J309 Allergic rhinitis, unspecified: Secondary | ICD-10-CM | POA: Diagnosis not present

## 2023-09-26 ENCOUNTER — Other Ambulatory Visit: Payer: Self-pay | Admitting: Podiatry

## 2023-10-07 ENCOUNTER — Telehealth: Payer: Self-pay | Admitting: Adult Health

## 2023-10-07 DIAGNOSIS — G4733 Obstructive sleep apnea (adult) (pediatric): Secondary | ICD-10-CM

## 2023-10-07 NOTE — Addendum Note (Signed)
Addended by: Enedina Finner on: 10/07/2023 04:15 PM   Modules accepted: Orders

## 2023-10-07 NOTE — Addendum Note (Signed)
Addended by: Bertram Savin on: 10/07/2023 01:43 PM   Modules accepted: Orders

## 2023-10-07 NOTE — Telephone Encounter (Signed)
Setup date 10/02/17. Last seen June 2024. If pt needs a new machine we would need to order a sleep study first. Not sure if those are the right numbers for her to call for [employer]. The main number we have for local customer service is [phone]. I tried to call the patient but was unable to reach her or LVM.

## 2023-10-07 NOTE — Telephone Encounter (Signed)
The patient called and said she has been trying to call to get set up with a cpap machine and she isn't able to get through to anyone. She said the phone numbers she has don't work and she can't find anything online. The information she has is: [employer] [phone] and [phone].  She said that with her medicare she can only get a prescription for this in June and December so she wants to get it now. Can you please call her back and provide her with the information for a company she can call to get this set up.

## 2023-10-07 NOTE — Telephone Encounter (Signed)
The patient called back.  We discussed the process for getting a new machine.  The patient verbalized understanding and she is in agreement to undergo a new home sleep test and will watch for a call from our office to schedule.

## 2023-10-08 NOTE — Progress Notes (Signed)
This encounter was created in error - please disregard.

## 2023-10-30 ENCOUNTER — Other Ambulatory Visit: Payer: Self-pay

## 2023-10-30 ENCOUNTER — Ambulatory Visit
Admission: RE | Admit: 2023-10-30 | Discharge: 2023-10-30 | Disposition: A | Discharge status: Home / Self Care | Payer: Medicare Other | Source: Ambulatory Visit | Attending: Nurse Practitioner | Admitting: Nurse Practitioner

## 2023-10-30 VITALS — BP 137/83 | HR 112 | Temp 97.8°F | Resp 17

## 2023-10-30 DIAGNOSIS — J069 Acute upper respiratory infection, unspecified: Secondary | ICD-10-CM | POA: Diagnosis not present

## 2023-10-30 DIAGNOSIS — J029 Acute pharyngitis, unspecified: Secondary | ICD-10-CM

## 2023-10-30 DIAGNOSIS — Z1152 Encounter for screening for COVID-19: Secondary | ICD-10-CM

## 2023-10-30 DIAGNOSIS — Z8709 Personal history of other diseases of the respiratory system: Secondary | ICD-10-CM | POA: Diagnosis present

## 2023-10-30 LAB — POCT RAPID STREP A (OFFICE): Rapid Strep A Screen: NEGATIVE

## 2023-10-30 MED ORDER — PREDNISONE 10 MG PO TABS: 10.0000 mg | ORAL_TABLET | Freq: Every day | ORAL | 0 refills | Status: AC

## 2023-10-30 MED ORDER — BENZONATATE 100 MG PO CAPS: 100.0000 mg | ORAL_CAPSULE | Freq: Three times a day (TID) | ORAL | 0 refills | Status: DC

## 2023-10-30 NOTE — ED Provider Notes (Signed)
[facility]    CSN: [id number] Arrival date & time: 10/30/23  1438      History   Chief Complaint Chief Complaint  Patient presents with   Sore Throat    Entered by patient    HPI Brooke Farrell is a 67 y.o. female.   The history is provided by the patient.   Patient reports for complaints of sore throat, runny nose, and cough that is been present for the past 3 days.  Patient denies fever, chills, headache, ear pain, wheezing, difficulty breathing, chest pain, abdominal pain, nausea, vomiting, diarrhea, or rash.  Patient reports that she does have an underlying history of seasonal allergies and receives allergy shots.  States that she was supposed to get her shot last week.  Patient states she has been taking Zicam for her symptoms.  Reports that she also uses Flonase and takes Zyrtec daily for her allergies.  Reports that sometimes she has to get a steroid for her allergies.  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   Fatty liver 04/28/2023   Flatulence, eructation and gas pain 04/28/2023   Gastroesophageal reflux disease 04/28/2023   Obesity 04/28/2023   Rectal bleeding 04/28/2023   Eczema 03/18/2023   Encounter for general adult medical examination with abnormal findings 12/12/2022   Low back pain 12/02/2022   Type 2 diabetes mellitus without complications (HCC) 11/19/2022   Anxiety and depression 11/19/2022   Cholelithiasis without obstruction 12/16/2021   Duodenitis 12/16/2021   Dysphagia 12/16/2021   Pharyngeal dysphagia  12/16/2021   Onychomycosis 11/21/2021   Primary insomnia 09/14/2019   OSA on CPAP 09/10/2018   Seasonal and perennial allergic rhinitis 08/26/2018   Mild intermittent asthma, uncomplicated 08/26/2018   IBS (irritable bowel syndrome) 03/02/2018   Gastroesophageal reflux disease with esophagitis 01/16/2015   H/O bilateral breast implants 01/21/2013   HPV (human papilloma virus) anogenital infection 05/25/2012    Past Surgical History:  Procedure Laterality Date   AUGMENTATION MAMMAPLASTY  1985   CESAREAN SECTION     EYE SURGERY Bilateral 05/01/2022   eyelid surgery   JOINT REPLACEMENT     NO PAST SURGERIES     TOTAL HIP ARTHROPLASTY  01/2020   dr. Despina Hick    OB History     Gravida  3   Para  1   Term  1   Preterm      AB  2   Living  1      SAB      IAB      Ectopic      Multiple      Live Births  1            Home Medications    Prior to Admission medications   Medication Sig Start Date End Date Taking? Authorizing Provider  benzonatate (TESSALON) 100 MG capsule Take 1 capsule (100 mg total) by mouth every 8 (eight) hours. 10/30/23  Yes Leath-Warren, Sadie Haber, NP  predniSONE (DELTASONE) 10 MG tablet Take 1 tablet (10 mg total) by mouth daily with breakfast for 5 days. 10/30/23 11/04/23 Yes Leath-Warren, Sadie Haber, NP  albuterol (VENTOLIN HFA) 108 (90 Base) MCG/ACT inhaler Inhale 2 puffs into the lungs every 4 (four) hours as needed for wheezing or shortness of breath. 02/19/23   Alfonse Spruce, MD  atorvastatin (LIPITOR) 10 MG tablet Take 1 tablet (10 mg total) by mouth daily. Patient not taking: Reported on 10/08/2023 03/18/23   Billie Lade, MD  azelastine (ASTELIN) 0.1 % nasal spray Place 1 spray into both nostrils daily. Use in each nostril as directed 02/19/23   Alfonse Spruce, MD  cetirizine (ZYRTEC) 10 MG tablet Take 1 tablet (10 mg total) by mouth daily as needed. 02/19/23 10/08/23  Alfonse Spruce, MD  cyclobenzaprine  (FLEXERIL) 10 MG tablet Take 10 mg by mouth at bedtime. 12/02/22   [provider]  Efinaconazole 10 % SOLN Apply 1 drop topically daily. 09/05/22   Vivi Barrack, DPM  EPINEPHrine (EPIPEN 2-PAK) 0.3 mg/0.3 mL IJ SOAJ injection Inject 0.3 mg into the muscle as needed for anaphylaxis. 09/10/23   Alfonse Spruce, MD  FLUoxetine (PROZAC) 20 MG capsule TAKE 1 CAPSULE BY MOUTH EVERY DAY 06/02/23   Billie Lade, MD  fluticasone [facility]) 50 MCG/ACT nasal spray Place 2 sprays into both nostrils daily. 02/19/23   Alfonse Spruce, MD  lamoTRIgine (LAMICTAL) 25 MG tablet Take 25 mg by mouth as directed. 1 tablet (25mg )by mouth for 7 days, then 2 tablets (50mg )by mouth for 7 days, then 3 tablets (75mg ) by mouth for 7 days, and 4 tablets (100mg ) by mouth daily thereafter. 09/16/23   [provider]  lisinopril (ZESTRIL) 5 MG tablet Take 1 tablet (5 mg total) by mouth daily. Patient not taking: Reported on 10/08/2023 03/18/23   Billie Lade, MD  meloxicam (MOBIC) 7.5 MG tablet Take 7.5 mg by mouth daily. 05/16/23   [provider]  montelukast (SINGULAIR) 10 MG tablet Take 1 tablet (10 mg total) by mouth at bedtime. 02/19/23 06/17/23  Alfonse Spruce, MD  pantoprazole (PROTONIX) 40 MG tablet Take 1 tablet (40 mg total) by mouth daily. 10/22/21   Hetty Blend, FNP  promethazine-dextromethorphan (PROMETHAZINE-DM) 6.25-15 MG/5ML syrup Take 5 mLs by mouth 4 (four) times daily as needed. 06/27/23   Particia Nearing, PA-C  Tavaborole 5 % SOLN APPLY 1 DROP TOPICALLY TO THE TOENAIL DAILY. 09/26/23   Vivi Barrack, DPM  tirzepatide [facility]) 5 MG/0.5ML Pen Inject 5 mg into the skin once a week. 06/17/23   Billie Lade, MD  traZODone (DESYREL) 50 MG tablet TAKE 0.5-1 TABLET BY MOUTH AT BEDTIME AS NEEDED FOR SLEEP. 07/10/23   Billie Lade, MD  triamcinolone (KENALOG) 0.025 % ointment Apply 1 Application topically 2 (two) times daily. 03/18/23   Billie Lade,  MD    Family History Family History  Problem Relation Age of Onset   Cancer Father        bone   Diabetes Father    Hypertension Father    Heart attack Father    Breast cancer Mother    Kidney disease Mother    Varicose Veins Mother    Diverticulitis Sister    Allergic rhinitis Neg Hx    Angioedema Neg Hx    Asthma Neg Hx    Atopy Neg Hx    Eczema Neg Hx    Immunodeficiency Neg Hx  Urticaria Neg Hx    Sleep apnea Neg Hx     Social History Social History   Tobacco Use   Smoking status: Never   Smokeless tobacco: Never  Vaping Use   Vaping status: Never Used  Substance Use Topics   Alcohol use: Yes    Comment: two drinks a month   Drug use: No     Allergies   Mixed grasses, Prednisone, Robitussin (alcohol free)  [guaifenesin], Sulfa antibiotics, Corticosteroids, and Amoxicillin   Review of Systems Review of Systems Per HPI  Physical Exam Triage Vital Signs ED Triage Vitals  Encounter Vitals Group     BP 10/30/23 1552 137/83     Systolic BP Percentile --      Diastolic BP Percentile --      Pulse Rate 10/30/23 1552 (!) 112     Resp 10/30/23 1552 17     Temp 10/30/23 1552 97.8 F (36.6 C)     Temp Source 10/30/23 1552 Oral     SpO2 10/30/23 1552 95 %     Weight --      Height --      Head Circumference --      Peak Flow --      Pain Score 10/30/23 1607 6     Pain Loc --      Pain Education --      Exclude from Growth Chart --    No data found.  Updated Vital Signs BP 137/83 (BP Location: Right Arm)   Pulse (!) 112   Temp 97.8 F (36.6 C) (Oral)   Resp 17   SpO2 95%   Visual Acuity Right Eye Distance:   Left Eye Distance:   Bilateral Distance:    Right Eye Near:   Left Eye Near:    Bilateral Near:     Physical Exam Vitals and nursing note reviewed.  Constitutional:      General: She is not in acute distress.    Appearance: She is well-developed.  HENT:     Head: Normocephalic.     Right Ear: Tympanic membrane, ear canal and  external ear normal.     Left Ear: Tympanic membrane, ear canal and external ear normal.     Nose: Congestion present.     Right Turbinates: Enlarged and swollen.     Left Turbinates: Enlarged and swollen.     Right Sinus: No maxillary sinus tenderness or frontal sinus tenderness.     Left Sinus: No maxillary sinus tenderness or frontal sinus tenderness.     Mouth/Throat:     Lips: Pink.     Pharynx: Pharyngeal swelling, posterior oropharyngeal erythema and postnasal drip present. No oropharyngeal exudate or uvula swelling.     Tonsils: 1+ on the right. 1+ on the left.     Comments: Moderate postnasal drainage present. Cobblestoning present to posterior oropharynx  Eyes:     Extraocular Movements: Extraocular movements intact.     Conjunctiva/sclera: Conjunctivae normal.     Pupils: Pupils are equal, round, and reactive to light.  Cardiovascular:     Rate and Rhythm: Normal rate and regular rhythm.     Pulses: Normal pulses.     Heart sounds: Normal heart sounds.  Pulmonary:     Effort: Pulmonary effort is normal. No respiratory distress.     Breath sounds: Normal breath sounds. No stridor. No wheezing, rhonchi or rales.  Abdominal:     General: Bowel sounds are normal.     Palpations: Abdomen  is soft.     Tenderness: There is no abdominal tenderness.  Musculoskeletal:     Cervical back: Normal range of motion.  Lymphadenopathy:     Cervical: No cervical adenopathy.  Skin:    General: Skin is warm and dry.  Neurological:     General: No focal deficit present.     Mental Status: She is alert and oriented to person, place, and time.  Psychiatric:        Mood and Affect: Mood normal.        Behavior: Behavior normal.      UC Treatments / Results  Labs (all labs ordered are listed, but only abnormal results are displayed) Labs Reviewed  CULTURE, GROUP A STREP ([facility])  SARS CORONAVIRUS 2 (TAT 6-24 HRS)  POCT RAPID STREP A (OFFICE)    EKG   Radiology No results  found.  Procedures Procedures (including critical care time)  Medications Ordered in UC Medications - No data to display  Initial Impression / Assessment and Plan / UC Course  I have reviewed the triage vital signs and the nursing notes.  Pertinent labs & imaging results that were available during my care of the patient were reviewed by me and considered in my medical decision making (see chart for details).  The rapid strep test was negative.  A throat culture and COVID test are pending.  Differential diagnoses include allergic rhinitis versus viral URI with cough.  Will provide symptomatic treatment with Tessalon 100 mg for cough, and prednisone 10 mg for chronic allergic rhinitis.  Patient was advised to continue Zyrtec and to begin her Singulair at bedtime along with continuing Flonase.  Supportive care recommendations were provided and discussed with the patient to include fluids, rest, over-the-counter Tylenol, normal saline nasal spray, and use of a humidifier during sleep.  Patient was advised regarding indications of when follow-up will be necessary.  Patient was in agreement with this plan of care and verbalized understanding.  All questions were answered.  Patient stable for discharge.  Final Clinical Impressions(s) / UC Diagnoses   Final diagnoses:  Sore throat  Viral URI with cough  History of allergic rhinitis  Encounter for screening for COVID-19     Discharge Instructions      The rapid strep test was negative, a throat culture and COVID test are pending.  You will be contacted if the pending test results are abnormal.  You will also have access to results via MyChart. Take medication as prescribed.  If you begin to have mood swings while taking the prednisone, stop the medication immediately.  Would like for you to start your Singulair at bedtime and begin taking your Zyrtec during the daytime. Normal saline nasal spray as needed for nasal congestion and runny  nose. For the cough, you may use a humidifier at nighttime during sleep and sleeping slightly elevated on pillows while symptoms persist. As discussed, if symptoms fail to improve, or appear to be worsening, you may follow-up in this clinic or with your primary care physician for further evaluation. Follow-up as needed.     ED Prescriptions     Medication Sig Dispense Auth. Provider   predniSONE (DELTASONE) 10 MG tablet Take 1 tablet (10 mg total) by mouth daily with breakfast for 5 days. 5 tablet Leath-Warren, Sadie Haber, NP   benzonatate (TESSALON) 100 MG capsule Take 1 capsule (100 mg total) by mouth every 8 (eight) hours. 21 capsule Leath-Warren, Sadie Haber, NP      PDMP  not reviewed this encounter.   Abran Cantor, NP 10/30/23 [phone]

## 2023-10-30 NOTE — ED Triage Notes (Signed)
Pt reports sore throat, cough, runny nose x3 days. Denies any gi/gu,generalized body aches.

## 2023-10-30 NOTE — Discharge Instructions (Addendum)
The rapid strep test was negative, a throat culture and COVID test are pending.  You will be contacted if the pending test results are abnormal.  You will also have access to results via MyChart. Take medication as prescribed.  If you begin to have mood swings while taking the prednisone, stop the medication immediately.  Would like for you to start your Singulair at bedtime and begin taking your Zyrtec during the daytime. Normal saline nasal spray as needed for nasal congestion and runny nose. For the cough, you may use a humidifier at nighttime during sleep and sleeping slightly elevated on pillows while symptoms persist. As discussed, if symptoms fail to improve, or appear to be worsening, you may follow-up in this clinic or with your primary care physician for further evaluation. Follow-up as needed.

## 2023-10-31 LAB — SARS CORONAVIRUS 2 (TAT 6-24 HRS): SARS Coronavirus 2: NEGATIVE

## 2023-11-03 LAB — CULTURE, GROUP A STREP (THRC)

## 2023-11-12 ENCOUNTER — Ambulatory Visit (INDEPENDENT_AMBULATORY_CARE_PROVIDER_SITE_OTHER): Payer: Medicare Other

## 2023-11-12 DIAGNOSIS — J309 Allergic rhinitis, unspecified: Secondary | ICD-10-CM

## 2023-11-25 ENCOUNTER — Ambulatory Visit (INDEPENDENT_AMBULATORY_CARE_PROVIDER_SITE_OTHER): Payer: Medicare Other | Admitting: Neurology

## 2023-11-25 DIAGNOSIS — G4731 Primary central sleep apnea: Secondary | ICD-10-CM

## 2023-11-25 DIAGNOSIS — G4734 Idiopathic sleep related nonobstructive alveolar hypoventilation: Secondary | ICD-10-CM

## 2023-11-25 DIAGNOSIS — G4733 Obstructive sleep apnea (adult) (pediatric): Secondary | ICD-10-CM

## 2023-11-26 NOTE — Progress Notes (Signed)
See procedure note.

## 2023-12-01 NOTE — Procedures (Signed)
[facility]  HOME SLEEP TEST (Watch PAT) REPORT  STUDY DATE: 11/25/2023  DOB: 01-12-1956  MRN: [id number]  ORDERING CLINICIAN: Huston Foley, MD, PhD   REFERRING CLINICIAN: Butch Penny, NP  CLINICAL INFORMATION/HISTORY: 68 year old female with an underlying medical history of acid reflux disease, allergies, anxiety, depression, asthma, hyperlipidemia, hypertension, and mild obesity, who presents for reevaluation of her obstructive sleep apnea.  She has been compliant with her CPAP of 10 cm with EPR of 3 with good apnea control and good tolerance of treatment.  She should be eligible for a new machine.  BMI: 31.4 kg/m  FINDINGS:   Sleep Summary:   Total Recording Time (hours, min): 7 hours, 56 min  Total Sleep Time (hours, min):  7 hours, 8 min  Percent REM (%):    36.1%   Respiratory Indices:   Calculated pAHI (per hour):  35.2/hour         REM pAHI:    62.9/hour       NREM pAHI: 21.5/hour  Central pAHI: 14.3/hour  Oxygen Saturation Statistics:    Oxygen Saturation (%) Mean: 92%   Minimum oxygen saturation (%):                 74%   O2 Saturation Range (%): 74-99%    O2 Saturation (minutes) <=88%: 23.3 min  Pulse Rate Statistics:   Pulse Mean (bpm):    72/min    Pulse Range (41-115/min)   IMPRESSION: OSA (obstructive sleep apnea), severe Nocturnal Hypoxemia Central sleep apnea  RECOMMENDATION:  This home sleep test demonstrates severe obstructive sleep apnea with a total AHI of 35.2/hour and O2 nadir of 74% with significant time below or at 88% saturation of over 20 minutes for the study, indicating nocturnal hypoxemia.  There was a milder central sleep apnea component as well.  Snoring was detected, in the moderate to loud range fairly consistently throughout the night.  Ongoing treatment with positive airway pressure is highly recommended. The patient has been compliant with her CPAP of 10 cm with EPR of 3 with good apnea control.   She should be eligible for a new machine and I would recommend prescribing CPAP at the current settings with new equipment and all new necessary supplies, with heated humidity, mask of choice, sized to fit.  Given that she has significant oxygen desaturation while asleep, an overnight pulse oximetry test (ONO on PAP therapy) once established on the new CPAP should be considered to make sure her oxygen saturations are indeed adequate while on home CPAP therapy.  A laboratory attended titration study can be considered in the future for optimization of treatment settings and to improve tolerance and compliance, if needed, down the road. Alternative treatment options are limited secondary to the severity of the patient's sleep disordered breathing, but may include surgical treatment with an implantable hypoglossal nerve stimulator (in carefully selected candidates, meeting criteria).  Concomitant weight loss is recommended (where clinically appropriate). Please note, that untreated obstructive sleep apnea may carry additional perioperative morbidity. Patients with significant obstructive sleep apnea should receive perioperative PAP therapy and the surgeons and particularly the anesthesiologist should be informed of the diagnosis and the severity of the sleep disordered breathing. The patient should be cautioned not to drive, work at heights, or operate dangerous or heavy equipment when tired or sleepy. Review and reiteration of good sleep hygiene measures should be pursued with any patient. Other causes of the patient's symptoms, including circadian rhythm disturbances, an underlying mood disorder, medication  effect and/or an underlying medical problem cannot be ruled out based on this test. Clinical correlation is recommended.  The patient and her referring provider will be notified of the test results. The patient will be seen in follow up in sleep clinic at [facility].  I certify that I have reviewed the raw data  recording prior to the issuance of this report in accordance with the standards of the American Academy of Sleep Medicine (AASM).  INTERPRETING PHYSICIAN:   Huston Foley, MD, PhD Medical Director, [facility]) Diplomat, ABPN (Neurology and Sleep)   [facility] [address] [place], [place] [address] [phone]

## 2023-12-02 ENCOUNTER — Encounter: Payer: Self-pay | Admitting: Adult Health

## 2023-12-02 ENCOUNTER — Other Ambulatory Visit: Payer: Self-pay | Admitting: Adult Health

## 2023-12-02 DIAGNOSIS — G4733 Obstructive sleep apnea (adult) (pediatric): Secondary | ICD-10-CM

## 2023-12-02 NOTE — Telephone Encounter (Signed)
I called pt & LVM (ok per DPR) asking for call back or respond to [employer]. We are wanting to confirm pt wishes to continue using [employer] before we send an order to them for a new machine. Left office number in message.

## 2023-12-02 NOTE — Progress Notes (Signed)
Pt called back to phone room and confirmed she wants to continue using [employer].

## 2023-12-02 NOTE — Progress Notes (Signed)
Order sent to [employer].

## 2023-12-03 ENCOUNTER — Ambulatory Visit (INDEPENDENT_AMBULATORY_CARE_PROVIDER_SITE_OTHER): Payer: Self-pay | Admitting: *Deleted

## 2023-12-03 DIAGNOSIS — J309 Allergic rhinitis, unspecified: Secondary | ICD-10-CM | POA: Diagnosis not present

## 2023-12-03 NOTE — Telephone Encounter (Signed)
New, Brooke Farrell, [name], RN; Alain Honey; Jeris Penta, [place]; 1 other Received, thank you!

## 2023-12-19 ENCOUNTER — Ambulatory Visit: Payer: Medicare Other | Admitting: Allergy & Immunology

## 2024-01-05 ENCOUNTER — Other Ambulatory Visit: Payer: Self-pay | Admitting: Student

## 2024-01-05 DIAGNOSIS — M25551 Pain in right hip: Secondary | ICD-10-CM

## 2024-01-07 ENCOUNTER — Ambulatory Visit (INDEPENDENT_AMBULATORY_CARE_PROVIDER_SITE_OTHER): Payer: Self-pay

## 2024-01-07 DIAGNOSIS — J309 Allergic rhinitis, unspecified: Secondary | ICD-10-CM

## 2024-02-04 ENCOUNTER — Ambulatory Visit (INDEPENDENT_AMBULATORY_CARE_PROVIDER_SITE_OTHER): Payer: Self-pay

## 2024-02-04 DIAGNOSIS — J309 Allergic rhinitis, unspecified: Secondary | ICD-10-CM

## 2024-02-05 DIAGNOSIS — J3081 Allergic rhinitis due to animal (cat) (dog) hair and dander: Secondary | ICD-10-CM | POA: Diagnosis not present

## 2024-02-06 DIAGNOSIS — J3089 Other allergic rhinitis: Secondary | ICD-10-CM | POA: Diagnosis not present

## 2024-02-25 ENCOUNTER — Ambulatory Visit: Payer: Medicare Other | Admitting: Allergy & Immunology

## 2024-02-25 ENCOUNTER — Encounter: Payer: Self-pay | Admitting: Allergy & Immunology

## 2024-02-25 ENCOUNTER — Other Ambulatory Visit: Payer: Self-pay

## 2024-02-25 DIAGNOSIS — K219 Gastro-esophageal reflux disease without esophagitis: Secondary | ICD-10-CM

## 2024-02-25 DIAGNOSIS — J4540 Moderate persistent asthma, uncomplicated: Secondary | ICD-10-CM | POA: Diagnosis not present

## 2024-02-25 DIAGNOSIS — M48061 Spinal stenosis, lumbar region without neurogenic claudication: Secondary | ICD-10-CM | POA: Insufficient documentation

## 2024-02-25 DIAGNOSIS — J302 Other seasonal allergic rhinitis: Secondary | ICD-10-CM | POA: Diagnosis not present

## 2024-02-25 DIAGNOSIS — J3089 Other allergic rhinitis: Secondary | ICD-10-CM | POA: Diagnosis not present

## 2024-02-25 MED ORDER — MONTELUKAST SODIUM 10 MG PO TABS: 10.0000 mg | ORAL_TABLET | Freq: Every day | ORAL | 3 refills | Status: AC

## 2024-02-25 NOTE — Progress Notes (Signed)
 FOLLOW UP  Date of Service/Encounter:  02/25/24   Assessment:   Seasonal and perennial allergic rhinitis (grasses, weeds, ragweed, trees, indoor mold, dust mites, cats, dogs, roach) - with worsening symptoms during this time of the year    Maintenance reached December 2020 on current vials    Mild intermittent asthma, uncomplicated   Fully vaccinated to COVID-19    Plan/Recommendations:   1. Seasonal and perennial allergic rhinitis - Testing at the last visit showed: grasses, ragweed, weeds, trees, indoor molds, dust mites, cat, dog, and cockroach - Come every TWO WEEKS for shots.  - Continue with Flonase  one spray per nostril daily as needed.  - Continue with Astelin  two sprays per nostril twice daily.  - Continue with Singulair  10mg  daily.  - Continue with Zyrtec  daily and chlorpheniramine as needed for breakthrough symptoms.   2. Mild persistent asthma, uncomplicated - Lung testing looked good today.  - Daily controller medication(s): Singulair  10mg  daily - Prior to physical activity: albuterol  2 puffs 10-15 minutes before physical activity. - Rescue medications: albuterol  4 puffs every 4-6 hours as needed - Asthma control goals:  * Full participation in all desired activities (may need albuterol  before activity) * Albuterol  use two time or less a week on average (not counting use with activity) * Cough interfering with sleep two time or less a month * Oral steroids no more than once a year * No hospitalizations  3. Gastroesophageal reflux disease - Continue with pantoprazole  40mg  daily as needed.  4. Return in about 1 year (around 02/24/2025). You can have the follow up appointment with Dr. Idolina Maker or a Nurse Practicioner (our Nurse Practitioners are excellent and always have Physician oversight!).    Subjective:   Brooke Farrell is a 68 y.o. 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   Fatty liver 04/28/2023   Flatulence, eructation and gas pain 04/28/2023   Gastroesophageal reflux disease 04/28/2023   Obesity 04/28/2023   Rectal bleeding 04/28/2023   Eczema 03/18/2023   Encounter for general adult medical examination with abnormal findings 12/12/2022   Low back pain 12/02/2022   Type 2 diabetes mellitus without complications (HCC) 11/19/2022   Anxiety and depression 11/19/2022   Cholelithiasis without obstruction 12/16/2021   Duodenitis 12/16/2021   Dysphagia 12/16/2021   Pharyngeal dysphagia 12/16/2021   Onychomycosis 11/21/2021   Primary insomnia 09/14/2019   OSA on CPAP 09/10/2018   Seasonal and perennial allergic rhinitis 08/26/2018   Mild intermittent asthma, uncomplicated 08/26/2018   IBS (irritable bowel syndrome) 03/02/2018   Gastroesophageal reflux disease with esophagitis 01/16/2015   H/O bilateral breast implants 01/21/2013   HPV (human papilloma virus) anogenital infection 05/25/2012    History obtained from: chart review and patient.  Discussed the use of AI scribe software for clinical note transcription with the patient and/or guardian, who gave verbal consent to proceed.  Brooke Farrell is a 68 y.o. female presenting for a follow up visit.  We last saw her in August 2024.  At that time, we repeated testing as show grasses, ragweed, weeds, trees, indoor molds, dust mites, cat, dog, and cockroach.  We decided to just get rid of molds in the next set of vials to maximize protection against pollens, dust mites, and animals.  We decided to use avoid they still have before starting the new vials.  We recommended coming every 2 weeks for shots and continuing with Flonase  and Astelin .  She also continued with  Singulair  and Zyrtec .  Since last visit, she has done very well.  Asthma/Respiratory Symptom History: Her asthma has been significantly nonexistent.  She has albuterol  she needs it, but she is not using it on a routine basis. She does have Singulair  which she is still taking.  She has not been on prednisone  and has not been to the emergency room for breathing.  She denies any nighttime coughing or wheezing.  Allergic Rhinitis Symptom History: She remains on Flonase  as well as Astelin .  She also has Zyrtec  that she takes.  Allergy  shots are going well.  She does feel like the new formulation of her allergy  shots has been helpful.  She is planning her garden which is about the size of our parking lot here in [place].  Currently she is doing her early spring crops that include broccoli and leafy greens as well as collards.  She is planning for tomatoes, peppers, and cucumbers for the summer.  Her grandchildren are encouraging her to do okra.  However, she does not like the slimy consistency of okra so she is leaning against that.  I recommended that she try roasting the okra with some salt-and-pepper.  Brooke Farrell is on allergen immunotherapy. She receives two injections. Immunotherapy script #1 contains trees, weeds, grasses, cat, and dog. She currently receives 0.50mL of the RED vial (1/100). Immunotherapy script #2 contains  ragweed, molds, dust mites, and cockroach. She currently receives 0.50mL of the RED vial (1/100). She started shots November of 2025 and reached maintenance in November of 2025.  GERD Symptom History: Reflux is under good control with pantoprazole  40 mg daily as needed.  She does not feel like she needs it on a routine basis, so she does not take it.  Otherwise, there have been no changes to her past medical history, surgical history, family history, or social history.    Review of systems otherwise negative other than that mentioned in the HPI.    Objective:   Vitals  reviewed.     Physical Exam Vitals reviewed.  Constitutional:      Appearance: She is well-developed.     Comments: Talkative female. Cooperative with the exam.   HENT:     Head: Normocephalic and atraumatic.     Right Ear: Tympanic membrane, ear canal and external ear normal.     Left Ear: Tympanic membrane, ear canal and external ear normal.     Nose: No nasal deformity, septal deviation, mucosal edema or rhinorrhea.     Right Turbinates: Enlarged, swollen and pale.     Left Turbinates: Enlarged, swollen and pale.     Right Sinus: No maxillary sinus tenderness or frontal sinus tenderness.     Left Sinus: No maxillary sinus tenderness or frontal sinus tenderness.     Comments: Scant rhinorrhea today.     Mouth/Throat:     Mouth: Mucous membranes are not pale and not dry.     Pharynx: Uvula midline.     Comments: Cobblestoning present in the posterior oropharynx.  Eyes:     General: Lids are normal. Allergic shiner present.        Right eye: No discharge.  Left eye: No discharge.     Conjunctiva/sclera: Conjunctivae normal.     Right eye: Right conjunctiva is not injected. No chemosis.    Left eye: Left conjunctiva is not injected. No chemosis.    Pupils: Pupils are equal, round, and reactive to light.  Cardiovascular:     Rate and Rhythm: Normal rate and regular rhythm.     Heart sounds: Normal heart sounds.  Pulmonary:     Effort: Pulmonary effort is normal. No tachypnea, accessory muscle usage or respiratory distress.     Breath sounds: Normal breath sounds. No wheezing, rhonchi or rales.     Comments: Moving air well in all lung fields. No increased work of breathing noted. Chest:     Chest wall: No tenderness.  Lymphadenopathy:     Cervical: No cervical adenopathy.  Skin:    Coloration: Skin is not pale.     Findings: No abrasion, erythema, petechiae or rash. Rash is not papular, urticarial or vesicular.  Neurological:     Mental Status: She is alert.   Psychiatric:        Behavior: Behavior is cooperative.      Diagnostic studies: none     Drexel Gentles, MD  [facility] 

## 2024-02-25 NOTE — Patient Instructions (Addendum)
 1. Seasonal and perennial allergic rhinitis - Testing at the last visit showed: grasses, ragweed, weeds, trees, indoor molds, dust mites, cat, dog, and cockroach - Come every TWO WEEKS for shots.  - Continue with Flonase  one spray per nostril daily as needed.  - Continue with Astelin  two sprays per nostril twice daily.  - Continue with Singulair  10mg  daily.  - Continue with Zyrtec  daily and chlorpheniramine as needed for breakthrough symptoms.   2. Mild persistent asthma, uncomplicated - Lung testing looked good today.  - Daily controller medication(s): Singulair  10mg  daily - Prior to physical activity: albuterol  2 puffs 10-15 minutes before physical activity. - Rescue medications: albuterol  4 puffs every 4-6 hours as needed - Asthma control goals:  * Full participation in all desired activities (may need albuterol  before activity) * Albuterol  use two time or less a week on average (not counting use with activity) * Cough interfering with sleep two time or less a month * Oral steroids no more than once a year * No hospitalizations  3. Gastroesophageal reflux disease - Continue with pantoprazole  40mg  daily as needed.  4. Return in about 1 year (around 02/24/2025). You can have the follow up appointment with Dr. Idolina Maker or a Nurse Practicioner (our Nurse Practitioners are excellent and always have Physician oversight!).    Please inform us  of any Emergency Department visits, hospitalizations, or changes in symptoms. Call us  before going to the ED for breathing or allergy  symptoms since we might be able to fit you in for a sick visit. Feel free to contact us  anytime with any questions, problems, or concerns.  It was a pleasure to see you again today!  Try Dr. Johnette Naegeli: [email or web address]  Websites that have reliable patient information: 1. American Academy of Asthma, Allergy , and Immunology: www.aaaai.org 2. Food Allergy  Research and Education  (FARE): foodallergy.org 3. Mothers of Asthmatics: http://www.asthmacommunitynetwork.org 4. [employer] of Allergy , Asthma, and Immunology: www.acaai.org      "Like" us  on Facebook and Instagram for our latest updates!      A healthy democracy works best when [employer] participate! Make sure you are registered to vote! If you have moved or changed any of your contact information, you will need to get this updated before voting! Scan the QR codes below to learn more!

## 2024-02-27 ENCOUNTER — Ambulatory Visit
Admission: RE | Admit: 2024-02-27 | Discharge: 2024-02-27 | Disposition: A | Discharge status: Home / Self Care | Payer: Medicare Other | Source: Ambulatory Visit | Attending: Internal Medicine | Admitting: Internal Medicine

## 2024-02-27 DIAGNOSIS — Z1231 Encounter for screening mammogram for malignant neoplasm of breast: Secondary | ICD-10-CM

## 2024-03-31 ENCOUNTER — Ambulatory Visit (INDEPENDENT_AMBULATORY_CARE_PROVIDER_SITE_OTHER): Payer: Self-pay

## 2024-03-31 DIAGNOSIS — J309 Allergic rhinitis, unspecified: Secondary | ICD-10-CM | POA: Diagnosis not present

## 2024-04-14 ENCOUNTER — Ambulatory Visit (INDEPENDENT_AMBULATORY_CARE_PROVIDER_SITE_OTHER): Payer: Self-pay

## 2024-04-14 DIAGNOSIS — J309 Allergic rhinitis, unspecified: Secondary | ICD-10-CM

## 2024-04-27 ENCOUNTER — Telehealth: Payer: Medicare Other | Admitting: Adult Health

## 2024-05-04 ENCOUNTER — Encounter: Payer: Self-pay | Admitting: Adult Health

## 2024-05-04 ENCOUNTER — Ambulatory Visit (INDEPENDENT_AMBULATORY_CARE_PROVIDER_SITE_OTHER): Payer: Medicare Other | Admitting: Adult Health

## 2024-05-04 VITALS — BP 118/81 | HR 76 | Ht 68.0 in | Wt 207.0 lb

## 2024-05-04 DIAGNOSIS — G4733 Obstructive sleep apnea (adult) (pediatric): Secondary | ICD-10-CM | POA: Diagnosis not present

## 2024-05-04 NOTE — Patient Instructions (Addendum)
 Continue using CPAP nightly and greater than 4 hours each night New order placed for new machine If your symptoms worsen or you develop new symptoms please let us  know.

## 2024-05-04 NOTE — Progress Notes (Signed)
 PATIENT: Brooke Farrell DOB: 12/06/1955  REASON FOR VISIT: follow up HISTORY FROM: patient PRIMARY NEUROLOGIST:  Dr. Buck  Chief Complaint  Patient presents with   Follow-up    Pt in 17 alone Pt here for cpap f/u Pt states needing a new cpap machine . Set up date 10/02/2017 Pt states needing new supplies      HISTORY OF PRESENT ILLNESS: Today 05/04/24:  Brooke Farrell is a 68 y.o. female with a history of OSA on CPAP. Returns today for follow-up.  Reports that CPAP is working well.  She states that she does need a new CPAP machine.  Her set up date was in November 2018. Home sleep study completed in January.  Her download is below       04/28/23: Brooke Farrell is a 68 y.o. female with a history of OSA on CPAP. Returns today for follow-up.  She reports that the CPAP is working well.  She denies any new symptoms.  Download is below      04/17/22: Brooke Farrell is a 68 year old female with a history of obstructive sleep apnea on CPAP.  She returns today for follow-up.  She reports that the CPAP is working well for her.  She does not like to sleep without it.  Download is below    04/11/21: Brooke Farrell is a 68 year old female with a history of obstructive sleep apnea on CPAP.  She returns today for follow-up.  She states that everything is going well with the CPAP.  Her only issue is that she needs new supplies and has not been able to get this from her DME company.    HISTORY (copied from Dr. Obie note)  04/05/2020: I reviewed her CPAP compliance data from 03/05/2020 through 04/03/2020, which is a total of 30 days, during which time she used her machine every night with percent use days greater than 4 hours at 97%, indicating excellent compliance with an average usage of 7 hours and 55 minutes, residual AHI at goal at 3.6/h, leak on the higher side with a 95th percentile at 19.5 L/min, pressure of 10 cm with EPR of 3.  She reports feeling stable and doing well with her CPAP,  indicates full compliance and ongoing good results.  She is up-to-date with her supplies and uses nasal pillows.  Although the pressure is currently at 10 cm, it seems adequate at this time.   The patient's allergies, current medications, family history, past medical history, past social history, past surgical history and problem list were reviewed and updated as appropriate.   REVIEW OF SYSTEMS: Out of a complete 14 system review of symptoms, the patient complains only of the following symptoms, and all other reviewed systems are negative.   ESS 20  ALLERGIES: Allergies  Allergen Reactions   Mixed Grasses Anaphylaxis and Shortness Of Breath   Other Swelling   Prednisone  Other (See Comments)    Patient reports having very bad mood swings and was very angry when on this drug   Robitussin (Alcohol Free)  [Guaifenesin ] Swelling   Sulfa Antibiotics Swelling   Corticosteroids    Amoxicillin Diarrhea    HOME MEDICATIONS: Outpatient Medications Prior to Visit  Medication Sig Dispense Refill   albuterol  (VENTOLIN  HFA) 108 (90 Base) MCG/ACT inhaler Inhale 2 puffs into the lungs every 4 (four) hours as needed for wheezing or shortness of breath. 18 g 1   azelastine  (ASTELIN ) 0.1 % nasal spray Place 1 spray into both nostrils daily.  Use in each nostril as directed 90 mL 3   benzonatate  (TESSALON ) 100 MG capsule Take 1 capsule (100 mg total) by mouth every 8 (eight) hours. (Patient taking differently: Take 100 mg by mouth as needed.) 21 capsule 0   cetirizine  (ZYRTEC ) 10 MG tablet Take 1 tablet (10 mg total) by mouth daily as needed. 90 tablet 3   Efinaconazole  10 % SOLN Apply 1 drop topically daily. 4 mL 11   EPINEPHrine  (EPIPEN  2-PAK) 0.3 mg/0.3 mL IJ SOAJ injection Inject 0.3 mg into the muscle as needed for anaphylaxis. 2 each 1   fluticasone  (FLONASE ) 50 MCG/ACT nasal spray Place 2 sprays into both nostrils daily. 48 g 3   meloxicam  (MOBIC ) 7.5 MG tablet Take 7.5 mg by mouth daily.      montelukast  (SINGULAIR ) 10 MG tablet Take 1 tablet (10 mg total) by mouth at bedtime. 90 tablet 3   triamcinolone  (KENALOG ) 0.025 % ointment Apply 1 Application topically 2 (two) times daily. 30 g 0   Tavaborole  5 % SOLN APPLY 1 DROP TOPICALLY TO THE TOENAIL DAILY. 10 mL 0   No facility-administered medications prior to visit.    PAST MEDICAL HISTORY: Past Medical History:  Diagnosis Date   Acid reflux    Allergy     Anxiety    Asthma    Cataract    Depression    Diabetes mellitus without complication (HCC)    Encounter for general adult medical examination with abnormal findings 12/12/2022   Hypertension    Sleep apnea     PAST SURGICAL HISTORY: Past Surgical History:  Procedure Laterality Date   AUGMENTATION MAMMAPLASTY  1985   BREAST BIOPSY Right    2022/2023   CESAREAN SECTION     EYE SURGERY Bilateral 05/01/2022   eyelid surgery   JOINT REPLACEMENT     NO PAST SURGERIES     TOTAL HIP ARTHROPLASTY  01/2020   dr. hiram    FAMILY HISTORY: Family History  Problem Relation Age of Onset   Breast cancer Mother 62 - 62   Kidney disease Mother    Varicose Veins Mother    Cancer Father        bone   Diabetes Father    Hypertension Father    Heart attack Father    Diverticulitis Sister    Allergic rhinitis Neg Hx    Angioedema Neg Hx    Asthma Neg Hx    Atopy Neg Hx    Eczema Neg Hx    Immunodeficiency Neg Hx    Urticaria Neg Hx    Sleep apnea Neg Hx     SOCIAL HISTORY: Social History   Socioeconomic History   Marital status: Married    Spouse name: Not on file   Number of children: Not on file   Years of education: Not on file   Highest education level: 12th grade  Occupational History   Not on file  Tobacco Use   Smoking status: Never   Smokeless tobacco: Never  Vaping Use   Vaping status: Never Used  Substance and Sexual Activity   Alcohol use: Yes    Comment: two drinks a month   Drug use: No   Sexual activity: Not Currently    Partners:  Male    Birth control/protection: Post-menopausal    Comment: 1st intercourse- 16, partners- 6, married- 25 yrs  Other Topics Concern   Not on file  Social History Narrative   Pt lives with family    Retired  Social Drivers of Corporate investment banker Strain: Low Risk  (02/14/2024)   Received from [facility]   Overall Financial Resource Strain (CARDIA)    Difficulty of Paying Living Expenses: Not hard at all  Food Insecurity: No Food Insecurity (02/14/2024)   Received from [facility]   Hunger Vital Sign    Within the past 12 months, you worried that your food would run out before you got the money to buy more.: Never true    Within the past 12 months, the food you bought just didn't last and you didn't have money to get more.: Never true  Transportation Needs: No Transportation Needs (02/14/2024)   Received from [facility] - Transportation    Lack of Transportation (Medical): No    Lack of Transportation (Non-Medical): No  Physical Activity: Sufficiently Active (02/14/2024)   Received from [facility]   Exercise Vital Sign    On average, how many days per week do you engage in moderate to strenuous exercise (like a brisk walk)?: 5 days    On average, how many minutes do you engage in exercise at this level?: 30 min  Stress: Stress Concern Present (02/14/2024)   Received from [facility] Occup[facility] - Occupational Stress Questionnaire    Feeling of Stress : To some extent  Social Connections: Moderately Integrated (02/14/2024)   Received from [facility]   Social Network    How would you rate your social network (family, work, friends)?: Adequate participation with social networks  Intimate Partner Violence: Not At Risk (02/14/2024)   Received from [facility]   HITS    Over the last 12 months how often did your partner physically hurt you?: Never    Over the last 12 months how often did your partner insult you or talk  down to you?: Rarely    Over the last 12 months how often did your partner threaten you with physical harm?: Never    Over the last 12 months how often did your partner scream or curse at you?: Rarely      PHYSICAL EXAM  Vitals:   05/04/24 1317  BP: 118/81  Pulse: 76  Weight: 207 lb (93.9 kg)  Height: 5' 8 (1.727 m)   Body mass index is 31.47 kg/m.  Generalized: Well developed, in no acute distress  Chest: Lungs clear to auscultation bilaterally  Neurological examination  Mentation: Alert oriented to time, place, history taking. Follows all commands speech and language fluent Cranial nerve II-XII: Facial symmetry noted   DIAGNOSTIC DATA (LABS, IMAGING, TESTING) - I reviewed patient records, labs, notes, testing and imaging myself where available.     ASSESSMENT AND PLAN 68 y.o. year old female  has a past medical history of Acid reflux, Allergy , Anxiety, Asthma, Cataract, Depression, Diabetes mellitus without complication (HCC), Encounter for general adult medical examination with abnormal findings (12/12/2022), Hypertension, and Sleep apnea. here with:  OSA on CPAP  - CPAP compliance excellent - Good treatment of AHI  - Encourage patient to use CPAP nightly and > 4 hours each night -Order placed for new machine - F/U in 1 year or sooner if needed   Duwaine Russell, MSN, NP-C 05/04/2024, 1:40 PM [facility] [address], [address] [place], [place] [address] [phone]

## 2024-05-05 ENCOUNTER — Encounter (INDEPENDENT_AMBULATORY_CARE_PROVIDER_SITE_OTHER): Payer: Self-pay | Admitting: *Deleted

## 2024-05-05 NOTE — Progress Notes (Signed)
 RE: new machine resmed with modem Received: Today New, Brooke Neysa Nena GORMAN, RN; Joylene Carlean Sheree Leveda Jackson Avelina; Tucker, Dolanda; 1 other Received, thank you!     Previous Messages    ----- Message ----- From: Neysa Nena GORMAN, RN Sent: 05/05/2024   8:00 AM EDT To: Brooke Joylene; Avelina Jackson; Ephraim Dollar* Subject: new machine resmed with modem                  New order in epic  Solash H. Goold Female, 68 y.o., Jun 15, 1956 Pronouns: she/her/hers MRN: [id number]   Thank you, Particia SAUNDERS

## 2024-05-17 ENCOUNTER — Ambulatory Visit (INDEPENDENT_AMBULATORY_CARE_PROVIDER_SITE_OTHER): Admitting: Gastroenterology

## 2024-05-17 ENCOUNTER — Encounter (INDEPENDENT_AMBULATORY_CARE_PROVIDER_SITE_OTHER): Payer: Self-pay | Admitting: *Deleted

## 2024-05-17 ENCOUNTER — Other Ambulatory Visit (INDEPENDENT_AMBULATORY_CARE_PROVIDER_SITE_OTHER): Payer: Self-pay | Admitting: *Deleted

## 2024-05-17 ENCOUNTER — Encounter (INDEPENDENT_AMBULATORY_CARE_PROVIDER_SITE_OTHER): Payer: Self-pay | Admitting: Gastroenterology

## 2024-05-17 VITALS — BP 105/67 | HR 77 | Temp 98.3°F | Ht 68.0 in | Wt 206.4 lb

## 2024-05-17 DIAGNOSIS — K219 Gastro-esophageal reflux disease without esophagitis: Secondary | ICD-10-CM | POA: Diagnosis not present

## 2024-05-17 DIAGNOSIS — K580 Irritable bowel syndrome with diarrhea: Secondary | ICD-10-CM

## 2024-05-17 DIAGNOSIS — K529 Noninfective gastroenteritis and colitis, unspecified: Secondary | ICD-10-CM | POA: Insufficient documentation

## 2024-05-17 DIAGNOSIS — K760 Fatty (change of) liver, not elsewhere classified: Secondary | ICD-10-CM | POA: Insufficient documentation

## 2024-05-17 MED ORDER — PEG 3350-KCL-NA BICARB-NACL 420 G PO SOLR
4000.0000 mL | Freq: Once | ORAL | 0 refills | Status: AC
Start: 2024-05-17 — End: 2024-05-17

## 2024-05-17 MED ORDER — CHOLESTYRAMINE 4 G PO PACK: 4.0000 g | PACK | Freq: Every day | ORAL | 5 refills | Status: DC

## 2024-05-17 NOTE — H&P (View-Only) (Signed)
 [name] , M.D. Gastroenterology & Hepatology [facility] Gastroenterology [address] [place], [place] [address] Primary Care Physician: Beam, Lamar POUR, MD [address] [place] [address]  Chief Complaint: Chronic diarrhea, GERD  History of Present Illness: Brooke Farrell is a 68 y.o. female with PTSD on SSRI, anxiety who presents for evaluation of chronic diarrhea and GERD  Patient was previously on Vibreza , was given diagnosis of GERD and IBS-D.  Patient has seen multiple gastroenterologist previously and continues to have significant symptoms  Patient reports 6-8 bowel movements daily which is liquid  dirty water for over 16 years .  Patient reports bowel movements are usually postprandial.  She also has nocturnal symptoms.  She will wake up in the middle of the night to defecatePatient has not noticed any particular foods with make her symptoms worse The patient denies having any nausea, vomiting, fever, chills, hematochezia, melena, hematemesis, abdominal pain,  jaundice, pruritus or weight loss.  Labs from 01/2024 hemoglobin 14 ALT 40 Hemoglobin A1c 6.4  Last EGD:5 years ago  Last Colonoscopy: 2018 [facility].  The colonoscopy was completed to the intended extent.  An ileocecal valve photo was captured An appendiceal orifice photo was captured A terminal ileum photo was captured.   2.  There was adequate preparation of the colon to follow recommended surveillance guidelines.   3.  Colonoscopy Polyp Specimen Collection: Biopsy of a non-polyp, mucosal abnormality performed.   Colon, random, endoscopic biopsy:   Colonic mucosa with no pathologic diagnosis. No active or chronic colitis is seen. No evidence of lymphocytic or collagenous colitis is seen.  FHx: neg for any gastrointestinal/liver disease, no malignancies Social: neg smoking, alcohol or illicit drug use   Past Medical History: Past Medical History:  Diagnosis  Date   Acid reflux    Allergy     Anxiety    Asthma    Cataract    Depression    Diabetes mellitus without complication (HCC)    Encounter for general adult medical examination with abnormal findings 12/12/2022   Hypertension    Sleep apnea     Past Surgical History: Past Surgical History:  Procedure Laterality Date   AUGMENTATION MAMMAPLASTY  1985   BREAST BIOPSY Right    2022/2023   CESAREAN SECTION     EYE SURGERY Bilateral 05/01/2022   eyelid surgery   JOINT REPLACEMENT     NO PAST SURGERIES     TOTAL HIP ARTHROPLASTY  01/2020   dr. hiram    Family History: Family History  Problem Relation Age of Onset   Breast cancer Mother 38 - 49   Kidney disease Mother    Varicose Veins Mother    Cancer Father        bone   Diabetes Father    Hypertension Father    Heart attack Father    Diverticulitis Sister    Allergic rhinitis Neg Hx    Angioedema Neg Hx    Asthma Neg Hx    Atopy Neg Hx    Eczema Neg Hx    Immunodeficiency Neg Hx    Urticaria Neg Hx    Sleep apnea Neg Hx     Social History: Social History   Tobacco Use  Smoking Status Never  Smokeless Tobacco Never   Social History   Substance and Sexual Activity  Alcohol Use Yes   Comment: two drinks a month   Social History   Substance and Sexual Activity  Drug Use No    Allergies: Allergies  Allergen Reactions   Mixed Grasses Anaphylaxis and Shortness Of Breath   Other Swelling   Prednisone  Other (See Comments)    Patient reports having very bad mood swings and was very angry when on this drug   Robitussin (Alcohol Free)  [Guaifenesin ] Swelling   Sulfa Antibiotics Swelling   Corticosteroids    Amoxicillin Diarrhea    Medications: Current Outpatient Medications  Medication Sig Dispense Refill   albuterol  (VENTOLIN  HFA) 108 (90 Base) MCG/ACT inhaler Inhale 2 puffs into the lungs every 4 (four) hours as needed for wheezing or shortness of breath. 18 g 1   azelastine  (ASTELIN ) 0.1 % nasal  spray Place 1 spray into both nostrils daily. Use in each nostril as directed 90 mL 3   benzonatate  (TESSALON ) 100 MG capsule Take 1 capsule (100 mg total) by mouth every 8 (eight) hours. (Patient taking differently: Take 100 mg by mouth as needed.) 21 capsule 0   cetirizine  (ZYRTEC ) 10 MG tablet Take 1 tablet (10 mg total) by mouth daily as needed. 90 tablet 3   colestipol (COLESTID) 1 g tablet Take 1 g by mouth 2 (two) times daily.     diphenoxylate -atropine  (LOMOTIL ) 2.5-0.025 MG tablet Take 1 tablet by mouth.     Efinaconazole  10 % SOLN Apply 1 drop topically daily. 4 mL 11   EPINEPHrine  (EPIPEN  2-PAK) 0.3 mg/0.3 mL IJ SOAJ injection Inject 0.3 mg into the muscle as needed for anaphylaxis. 2 each 1   fluticasone  (FLONASE ) 50 MCG/ACT nasal spray Place 2 sprays into both nostrils daily. 48 g 3   montelukast  (SINGULAIR ) 10 MG tablet Take 1 tablet (10 mg total) by mouth at bedtime. 90 tablet 3   sertraline (ZOLOFT) 100 MG tablet Take 100 mg by mouth daily.     triamcinolone  (KENALOG ) 0.025 % ointment Apply 1 Application topically 2 (two) times daily. 30 g 0   No current facility-administered medications for this visit.    Review of Systems: GENERAL: negative for malaise, night sweats HEENT: No changes in hearing or vision, no nose bleeds or other nasal problems. NECK: Negative for lumps, goiter, pain and significant neck swelling RESPIRATORY: Negative for cough, wheezing CARDIOVASCULAR: Negative for chest pain, leg swelling, palpitations, orthopnea GI: SEE HPI MUSCULOSKELETAL: Negative for joint pain or swelling, back pain, and muscle pain. SKIN: Negative for lesions, rash HEMATOLOGY Negative for prolonged bleeding, bruising easily, and swollen nodes. ENDOCRINE: Negative for cold or heat intolerance, polyuria, polydipsia and goiter. NEURO: negative for tremor, gait imbalance, syncope and seizures. The remainder of the review of systems is noncontributory.   Physical Exam: BP 105/67    Pulse 77   Temp 98.3 F (36.8 C)   Ht 5' 8 (1.727 m)   Wt 206 lb 6.4 oz (93.6 kg)   BMI 31.38 kg/m  GENERAL: The patient is AO x3, in no acute distress. HEENT: Head is normocephalic and atraumatic. EOMI are intact. Mouth is well hydrated and without lesions. NECK: Supple. No masses LUNGS: Clear to auscultation. No presence of rhonchi/wheezing/rales. Adequate chest expansion HEART: RRR, normal s1 and s2. ABDOMEN: Soft, nontender, no guarding, no peritoneal signs, and nondistended. BS +. No masses.  Imaging/Labs: as above     Latest Ref Rng & Units 03/18/2023    8:46 AM  CBC  WBC 3.4 - 10.8 x10E3/uL 5.7   Hemoglobin 11.1 - 15.9 g/dL 86.4   Hematocrit 65.9 - 46.6 % 40.8   Platelets 150 - 450  x10E3/uL 254    No results found for: IRON, TIBC, FERRITIN  I personally reviewed and interpreted the available labs, imaging and endoscopic files.  2018   IMPRESSION:  1. No acute abnormalities.  2.  Hepatic steatosis. Stable liver cyst.  3.   Hiatal hernia.  4.  Cholelithiasis without complications.  Impression and Plan:  Brooke Farrell is a 67 y.o. female with PTSD on SSRI, anxiety who presents for evaluation of chronic diarrhea and GERD  #Chronic diarrhea  Patient has more than 3 loose stools for more than 4 weeks hence qualifies for chronic diarrhea.  Patient have significant diarrhea affecting her quality of life despite taking colestipol and Lomotil   This could be secondary to secretory , osmotic ,functional ,malabsorptive or inflammatory diarrhea  Patient does have nocturnal symptoms and diarrhea is mostly postprandial.  This could be exocrine pancreatic insufficiency  He does not have abdominal pain and hence doubt IBS-D,   Patient diet is low in fiber and recommended Metamucil starting 1 scoop daily for 1 week followed by 2 scoops daily second week, and 3 scoops daily thereafter, to bulk up stool  Currently on Lomotil  in colestipol without much relief  We will  obtain blood work including , check for celiac disease with TTG IgA and total IgA, alpha gal and obtain stool studies, fecal elastase  Patient currently is on SSRI and with nocturnal symptoms could be microscopic colitis  Last endoscopy/colonoscopy over 5 years  Proceed with EGD with small bowel biopsies and Colonoscopy with random colonic biopsies .  Will give trial of Creon Change colestipol to cholestyramine   If above is not the patient's symptoms will prescribe Vibrezi as patient was only 5 years ago and seem to have some improvement  All questions were answered.      Brooke Farrell Faizan Filipe Greathouse, MD Gastroenterology and Hepatology [facility] Gastroenterology   This chart has been completed using [facility] Dictation software, and while attempts have been made to ensure accuracy , certain words and phrases may not be transcribed as intended

## 2024-05-17 NOTE — Patient Instructions (Signed)
 It was very nice to meet you today, as dicussed with will plan for the following :  1) labs and stool samples  2) CREON only after submitting stool samples  3) Upper endoscopy and colonoscopy  4) Cholestyramine  and stop Cholestipol

## 2024-05-17 NOTE — Progress Notes (Signed)
 Brooke Farrell Brooke Farrell Brooke Farrell Brooke Farrell , M.D. Gastroenterology & Hepatology [facility] Gastroenterology [address] [place], [place] [address] Primary Care Physician: Beam, Lamar POUR, MD [address] [place] [address]  Chief Complaint: Chronic diarrhea, GERD  History of Present Illness: Brooke Farrell is a 68 y.o. female with PTSD on SSRI, anxiety who presents for evaluation of chronic diarrhea and GERD  Patient was previously on Vibreza , was given diagnosis of GERD and IBS-D.  Patient has seen multiple gastroenterologist previously and continues to have significant symptoms  Patient reports 6-8 bowel movements daily which is liquid  dirty water for over 16 years .  Patient reports bowel movements are usually postprandial.  She also has nocturnal symptoms.  She will wake up in the middle of the night to defecatePatient has not noticed any particular foods with make her symptoms worse The patient denies having any nausea, vomiting, fever, chills, hematochezia, melena, hematemesis, abdominal pain,  jaundice, pruritus or weight loss.  Labs from 01/2024 hemoglobin 14 ALT 40 Hemoglobin A1c 6.4  Last EGD:5 years ago  Last Colonoscopy: 2018 [facility].  The colonoscopy was completed to the intended extent.  An ileocecal valve photo was captured An appendiceal orifice photo was captured A terminal ileum photo was captured.   2.  There was adequate preparation of the colon to follow recommended surveillance guidelines.   3.  Colonoscopy Polyp Specimen Collection: Biopsy of a non-polyp, mucosal abnormality performed.   Colon, random, endoscopic biopsy:   Colonic mucosa with no pathologic diagnosis. No active or chronic colitis is seen. No evidence of lymphocytic or collagenous colitis is seen.  FHx: neg for any gastrointestinal/liver disease, no malignancies Social: neg smoking, alcohol or illicit drug use   Past Medical History: Past Medical History:  Diagnosis  Date   Acid reflux    Allergy     Anxiety    Asthma    Cataract    Depression    Diabetes mellitus without complication (HCC)    Encounter for general adult medical examination with abnormal findings 12/12/2022   Hypertension    Sleep apnea     Past Surgical History: Past Surgical History:  Procedure Laterality Date   AUGMENTATION MAMMAPLASTY  1985   BREAST BIOPSY Right    2022/2023   CESAREAN SECTION     EYE SURGERY Bilateral 05/01/2022   eyelid surgery   JOINT REPLACEMENT     NO PAST SURGERIES     TOTAL HIP ARTHROPLASTY  01/2020   dr. hiram    Family History: Family History  Problem Relation Age of Onset   Breast cancer Mother 38 - 49   Kidney disease Mother    Varicose Veins Mother    Cancer Father        bone   Diabetes Father    Hypertension Father    Heart attack Father    Diverticulitis Sister    Allergic rhinitis Neg Hx    Angioedema Neg Hx    Asthma Neg Hx    Atopy Neg Hx    Eczema Neg Hx    Immunodeficiency Neg Hx    Urticaria Neg Hx    Sleep apnea Neg Hx     Social History: Social History   Tobacco Use  Smoking Status Never  Smokeless Tobacco Never   Social History   Substance and Sexual Activity  Alcohol Use Yes   Comment: two drinks a month   Social History   Substance and Sexual Activity  Drug Use No    Allergies: Allergies  Allergen Reactions   Mixed Grasses Anaphylaxis and Shortness Of Breath   Other Swelling   Prednisone  Other (See Comments)    Patient reports having very bad mood swings and was very angry when on this drug   Robitussin (Alcohol Free)  [Guaifenesin ] Swelling   Sulfa Antibiotics Swelling   Corticosteroids    Amoxicillin Diarrhea    Medications: Current Outpatient Medications  Medication Sig Dispense Refill   albuterol  (VENTOLIN  HFA) 108 (90 Base) MCG/ACT inhaler Inhale 2 puffs into the lungs every 4 (four) hours as needed for wheezing or shortness of breath. 18 g 1   azelastine  (ASTELIN ) 0.1 % nasal  spray Place 1 spray into both nostrils daily. Use in each nostril as directed 90 mL 3   benzonatate  (TESSALON ) 100 MG capsule Take 1 capsule (100 mg total) by mouth every 8 (eight) hours. (Patient taking differently: Take 100 mg by mouth as needed.) 21 capsule 0   cetirizine  (ZYRTEC ) 10 MG tablet Take 1 tablet (10 mg total) by mouth daily as needed. 90 tablet 3   colestipol (COLESTID) 1 g tablet Take 1 g by mouth 2 (two) times daily.     diphenoxylate -atropine  (LOMOTIL ) 2.5-0.025 MG tablet Take 1 tablet by mouth.     Efinaconazole  10 % SOLN Apply 1 drop topically daily. 4 mL 11   EPINEPHrine  (EPIPEN  2-PAK) 0.3 mg/0.3 mL IJ SOAJ injection Inject 0.3 mg into the muscle as needed for anaphylaxis. 2 each 1   fluticasone  (FLONASE ) 50 MCG/ACT nasal spray Place 2 sprays into both nostrils daily. 48 g 3   montelukast  (SINGULAIR ) 10 MG tablet Take 1 tablet (10 mg total) by mouth at bedtime. 90 tablet 3   sertraline (ZOLOFT) 100 MG tablet Take 100 mg by mouth daily.     triamcinolone  (KENALOG ) 0.025 % ointment Apply 1 Application topically 2 (two) times daily. 30 g 0   No current facility-administered medications for this visit.    Review of Systems: GENERAL: negative for malaise, night sweats HEENT: No changes in hearing or vision, no nose bleeds or other nasal problems. NECK: Negative for lumps, goiter, pain and significant neck swelling RESPIRATORY: Negative for cough, wheezing CARDIOVASCULAR: Negative for chest pain, leg swelling, palpitations, orthopnea GI: SEE HPI MUSCULOSKELETAL: Negative for joint pain or swelling, back pain, and muscle pain. SKIN: Negative for lesions, rash HEMATOLOGY Negative for prolonged bleeding, bruising easily, and swollen nodes. ENDOCRINE: Negative for cold or heat intolerance, polyuria, polydipsia and goiter. NEURO: negative for tremor, gait imbalance, syncope and seizures. The remainder of the review of systems is noncontributory.   Physical Exam: BP 105/67    Pulse 77   Temp 98.3 F (36.8 C)   Ht 5' 8 (1.727 m)   Wt 206 lb 6.4 oz (93.6 kg)   BMI 31.38 kg/m  GENERAL: The patient is AO x3, in no acute distress. HEENT: Head is normocephalic and atraumatic. EOMI are intact. Mouth is well hydrated and without lesions. NECK: Supple. No masses LUNGS: Clear to auscultation. No presence of rhonchi/wheezing/rales. Adequate chest expansion HEART: RRR, normal s1 and s2. ABDOMEN: Soft, nontender, no guarding, no peritoneal signs, and nondistended. BS +. No masses.  Imaging/Labs: as above     Latest Ref Rng & Units 03/18/2023    8:46 AM  CBC  WBC 3.4 - 10.8 x10E3/uL 5.7   Hemoglobin 11.1 - 15.9 g/dL 86.4   Hematocrit 65.9 - 46.6 % 40.8   Platelets 150 - 450  x10E3/uL 254    No results found for: IRON, TIBC, FERRITIN  I personally reviewed and interpreted the available labs, imaging and endoscopic files.  2018   IMPRESSION:  1. No acute abnormalities.  2.  Hepatic steatosis. Stable liver cyst.  3.   Hiatal hernia.  4.  Cholelithiasis without complications.  Impression and Plan:  Brooke Farrell is a 67 y.o. female with PTSD on SSRI, anxiety who presents for evaluation of chronic diarrhea and GERD  #Chronic diarrhea  Patient has more than 3 loose stools for more than 4 weeks hence qualifies for chronic diarrhea.  Patient have significant diarrhea affecting her quality of life despite taking colestipol and Lomotil   This could be secondary to secretory , osmotic ,functional ,malabsorptive or inflammatory diarrhea  Patient does have nocturnal symptoms and diarrhea is mostly postprandial.  This could be exocrine pancreatic insufficiency  He does not have abdominal pain and hence doubt IBS-D,   Patient diet is low in fiber and recommended Metamucil starting 1 scoop daily for 1 week followed by 2 scoops daily second week, and 3 scoops daily thereafter, to bulk up stool  Currently on Lomotil  in colestipol without much relief  We will  obtain blood work including , check for celiac disease with TTG IgA and total IgA, alpha gal and obtain stool studies, fecal elastase  Patient currently is on SSRI and with nocturnal symptoms could be microscopic colitis  Last endoscopy/colonoscopy over 5 years  Proceed with EGD with small bowel biopsies and Colonoscopy with random colonic biopsies .  Will give trial of Creon Change colestipol to cholestyramine   If above is not the patient's symptoms will prescribe Vibrezi as patient was only 5 years ago and seem to have some improvement  All questions were answered.      Kyrsten Deleeuw Brooke Farrell Filipe Greathouse, MD Gastroenterology and Hepatology [facility] Gastroenterology   This chart has been completed using [facility] Dictation software, and while attempts have been made to ensure accuracy , certain words and phrases may not be transcribed as intended

## 2024-05-18 ENCOUNTER — Other Ambulatory Visit: Payer: Self-pay | Admitting: *Deleted

## 2024-05-18 DIAGNOSIS — G4733 Obstructive sleep apnea (adult) (pediatric): Secondary | ICD-10-CM

## 2024-05-18 NOTE — Progress Notes (Signed)
 Brooke Farrell, [name], [name], RN; Alain Honey; Jeris Penta, Brooke Farrell [place]; 1 other Received, thank you!

## 2024-05-21 ENCOUNTER — Ambulatory Visit (INDEPENDENT_AMBULATORY_CARE_PROVIDER_SITE_OTHER): Payer: Self-pay

## 2024-05-21 DIAGNOSIS — J309 Allergic rhinitis, unspecified: Secondary | ICD-10-CM

## 2024-05-21 LAB — ALPHA-GAL PANEL
Allergen Lamb IgE: <0.1 kU/L
Beef IgE: <0.1 kU/L
IgE (Immunoglobulin E), Serum: 103 [IU]/mL (ref 6–495)
O215-IgE Alpha-Gal: <0.1 kU/L
Pork IgE: <0.1 kU/L

## 2024-05-21 LAB — C-REACTIVE PROTEIN: CRP: 6 mg/L (ref 0–10)

## 2024-05-21 LAB — CELIAC AB TTG DGP TIGA
Antigliadin Abs, IgA: 6 U (ref 0–19)
Gliadin IgG: 6 U (ref 0–19)
IgA/Immunoglobulin A, Serum: 237 mg/dL (ref 87–352)
Tissue Transglut Ab: 3 U/mL (ref 0–5)
Transglutaminase IgA: <2 U/mL (ref 0–3)

## 2024-05-21 LAB — PANCREATIC ELASTASE, FECAL: Pancreatic Elastase, Fecal: 44 ug Elast./g — ABNORMAL LOW (ref >200)

## 2024-06-01 ENCOUNTER — Ambulatory Visit (INDEPENDENT_AMBULATORY_CARE_PROVIDER_SITE_OTHER): Payer: Self-pay | Admitting: Gastroenterology

## 2024-06-01 DIAGNOSIS — K8681 Exocrine pancreatic insufficiency: Secondary | ICD-10-CM

## 2024-06-01 MED ORDER — PANCRELIPASE (LIP-PROT-AMYL) 36000-114000 UNITS PO CPEP
ORAL_CAPSULE | ORAL | 11 refills | Status: AC
Start: 2024-06-01 — End: ?

## 2024-06-01 NOTE — Progress Notes (Signed)
 Pancreatic elastase 44 suggesting Severe Pancreatic Insufficiency: <100  CRP 6 Negative celiac panel Negative alpha gal  Will start patient on Creon 

## 2024-06-07 ENCOUNTER — Ambulatory Visit (HOSPITAL_BASED_OUTPATIENT_CLINIC_OR_DEPARTMENT_OTHER): Admitting: Anesthesiology

## 2024-06-07 ENCOUNTER — Encounter (HOSPITAL_COMMUNITY)
Admission: RE | Disposition: A | Discharge status: Home / Self Care | Payer: Self-pay | Source: Home / Self Care | Attending: Gastroenterology

## 2024-06-07 ENCOUNTER — Ambulatory Visit (HOSPITAL_COMMUNITY)
Admission: RE | Admit: 2024-06-07 | Discharge: 2024-06-07 | Disposition: A | Discharge status: Home / Self Care | Attending: Gastroenterology | Admitting: Gastroenterology

## 2024-06-07 ENCOUNTER — Other Ambulatory Visit: Payer: Self-pay

## 2024-06-07 ENCOUNTER — Encounter (HOSPITAL_COMMUNITY): Payer: Self-pay | Admitting: Gastroenterology

## 2024-06-07 ENCOUNTER — Ambulatory Visit (HOSPITAL_COMMUNITY): Admitting: Anesthesiology

## 2024-06-07 DIAGNOSIS — J45909 Unspecified asthma, uncomplicated: Secondary | ICD-10-CM | POA: Insufficient documentation

## 2024-06-07 DIAGNOSIS — Z8249 Family history of ischemic heart disease and other diseases of the circulatory system: Secondary | ICD-10-CM | POA: Diagnosis not present

## 2024-06-07 DIAGNOSIS — G709 Myoneural disorder, unspecified: Secondary | ICD-10-CM | POA: Diagnosis not present

## 2024-06-07 DIAGNOSIS — G4730 Sleep apnea, unspecified: Secondary | ICD-10-CM | POA: Insufficient documentation

## 2024-06-07 DIAGNOSIS — K529 Noninfective gastroenteritis and colitis, unspecified: Secondary | ICD-10-CM

## 2024-06-07 DIAGNOSIS — K2970 Gastritis, unspecified, without bleeding: Secondary | ICD-10-CM | POA: Insufficient documentation

## 2024-06-07 DIAGNOSIS — K259 Gastric ulcer, unspecified as acute or chronic, without hemorrhage or perforation: Secondary | ICD-10-CM | POA: Diagnosis not present

## 2024-06-07 DIAGNOSIS — K648 Other hemorrhoids: Secondary | ICD-10-CM | POA: Insufficient documentation

## 2024-06-07 DIAGNOSIS — I10 Essential (primary) hypertension: Secondary | ICD-10-CM | POA: Diagnosis not present

## 2024-06-07 DIAGNOSIS — Z79899 Other long term (current) drug therapy: Secondary | ICD-10-CM | POA: Diagnosis not present

## 2024-06-07 DIAGNOSIS — F32A Depression, unspecified: Secondary | ICD-10-CM | POA: Insufficient documentation

## 2024-06-07 DIAGNOSIS — E119 Type 2 diabetes mellitus without complications: Secondary | ICD-10-CM | POA: Diagnosis not present

## 2024-06-07 DIAGNOSIS — K449 Diaphragmatic hernia without obstruction or gangrene: Secondary | ICD-10-CM | POA: Insufficient documentation

## 2024-06-07 DIAGNOSIS — K3189 Other diseases of stomach and duodenum: Secondary | ICD-10-CM | POA: Insufficient documentation

## 2024-06-07 DIAGNOSIS — K52831 Collagenous colitis: Secondary | ICD-10-CM | POA: Insufficient documentation

## 2024-06-07 DIAGNOSIS — K219 Gastro-esophageal reflux disease without esophagitis: Secondary | ICD-10-CM | POA: Insufficient documentation

## 2024-06-07 DIAGNOSIS — K909 Intestinal malabsorption, unspecified: Secondary | ICD-10-CM

## 2024-06-07 DIAGNOSIS — F4310 Post-traumatic stress disorder, unspecified: Secondary | ICD-10-CM | POA: Insufficient documentation

## 2024-06-07 DIAGNOSIS — F419 Anxiety disorder, unspecified: Secondary | ICD-10-CM | POA: Insufficient documentation

## 2024-06-07 HISTORY — PX: ESOPHAGOGASTRODUODENOSCOPY: SHX5428

## 2024-06-07 HISTORY — DX: Other specified postprocedural states: Z98.890

## 2024-06-07 HISTORY — DX: Nausea with vomiting, unspecified: R11.2

## 2024-06-07 HISTORY — PX: COLONOSCOPY: SHX5424

## 2024-06-07 LAB — GLUCOSE, CAPILLARY: Glucose-Capillary: 100 mg/dL — ABNORMAL HIGH (ref 70–99)

## 2024-06-07 SURGERY — COLONOSCOPY
Anesthesia: General

## 2024-06-07 MED ORDER — EPHEDRINE SULFATE-NACL 50-0.9 MG/10ML-% IV SOSY
PREFILLED_SYRINGE | INTRAVENOUS | Status: DC | PRN
Start: 2024-06-07 — End: 2024-06-07
  Administered 2024-06-07: 5 mg via INTRAVENOUS

## 2024-06-07 MED ORDER — PROPOFOL 500 MG/50ML IV EMUL
INTRAVENOUS | Status: DC | PRN
  Administered 2024-06-07: 150 ug/kg/min via INTRAVENOUS

## 2024-06-07 MED ORDER — GLYCOPYRROLATE PF 0.2 MG/ML IJ SOSY
PREFILLED_SYRINGE | INTRAMUSCULAR | Status: DC | PRN
  Administered 2024-06-07: .2 mg via INTRAVENOUS

## 2024-06-07 MED ORDER — PHENYLEPHRINE 80 MCG/ML (10ML) SYRINGE FOR IV PUSH (FOR BLOOD PRESSURE SUPPORT)
PREFILLED_SYRINGE | INTRAVENOUS | Status: DC | PRN
  Administered 2024-06-07 (×2): 80 ug via INTRAVENOUS
  Administered 2024-06-07: 40 ug via INTRAVENOUS

## 2024-06-07 MED ORDER — PANTOPRAZOLE SODIUM 40 MG PO TBEC
40.0000 mg | DELAYED_RELEASE_TABLET | Freq: Every day | ORAL | 1 refills | Status: DC
Start: 2024-06-07 — End: 2024-06-29

## 2024-06-07 MED ORDER — LACTATED RINGERS IV SOLN: INTRAVENOUS | Status: DC | PRN

## 2024-06-07 MED ORDER — DEXMEDETOMIDINE HCL IN NACL 80 MCG/20ML IV SOLN
INTRAVENOUS | Status: DC | PRN
  Administered 2024-06-07: 10 ug via INTRAVENOUS

## 2024-06-07 MED ORDER — PROPOFOL 10 MG/ML IV BOLUS
INTRAVENOUS | Status: DC | PRN
  Administered 2024-06-07: 100 mg via INTRAVENOUS

## 2024-06-07 MED ORDER — LIDOCAINE 2% (20 MG/ML) 5 ML SYRINGE
INTRAMUSCULAR | Status: DC | PRN
Start: 2024-06-07 — End: 2024-06-07
  Administered 2024-06-07: 100 mg via INTRAVENOUS

## 2024-06-07 NOTE — Transfer of Care (Signed)
 Immediate Anesthesia Transfer of Care Note  Patient: Brooke Farrell  Procedure(s) Performed: COLONOSCOPY EGD (ESOPHAGOGASTRODUODENOSCOPY)  Patient Location: PACU  Anesthesia Type:MAC  Level of Consciousness: drowsy and patient cooperative  Airway & Oxygen Therapy: Patient Spontanous Breathing  Post-op Assessment: Report given to RN and Post -op Vital signs reviewed and stable  Post vital signs: Reviewed and stable  Last Vitals:  Vitals Value Taken Time  BP 98/57 06/07/2024 11:31  Temp    Pulse 81 06/07/2024 11:30  Resp 19 06/07/2024 11:30  SpO2 94% on RA 06/07/2024 11:30    Last Pain:  Vitals:   06/07/24 1053  TempSrc:   PainSc: 0-No pain         Complications: No notable events documented.

## 2024-06-07 NOTE — Anesthesia Preprocedure Evaluation (Addendum)
 Anesthesia Evaluation  Patient identified by MRN, date of birth, ID band Patient awake    Reviewed: Allergy  & Precautions, H&P , NPO status , Patient's Chart, lab work & pertinent test results  History of Anesthesia Complications (+) PONV and history of anesthetic complications  Airway Mallampati: II  TM Distance: >3 FB Neck ROM: Full    Dental no notable dental hx.    Pulmonary asthma , sleep apnea    Pulmonary exam normal breath sounds clear to auscultation       Cardiovascular hypertension, Normal cardiovascular exam Rhythm:Regular Rate:Normal     Neuro/Psych  PSYCHIATRIC DISORDERS Anxiety Depression     Neuromuscular disease    GI/Hepatic Neg liver ROS,GERD  ,,  Endo/Other  diabetes    Renal/GU negative Renal ROS  negative genitourinary   Musculoskeletal negative musculoskeletal ROS (+)    Abdominal   Peds negative pediatric ROS (+)  Hematology negative hematology ROS (+)   Anesthesia Other Findings   Reproductive/Obstetrics negative OB ROS                              Anesthesia Physical Anesthesia Plan  ASA: 2  Anesthesia Plan: General   Post-op Pain Management:    Induction: Intravenous  PONV Risk Score and Plan: Propofol  infusion  Airway Management Planned: Nasal Cannula  Additional Equipment:   Intra-op Plan:   Post-operative Plan:   Informed Consent: I have reviewed the patients History and Physical, chart, labs and discussed the procedure including the risks, benefits and alternatives for the proposed anesthesia with the patient or authorized representative who has indicated his/her understanding and acceptance.     Dental advisory given  Plan Discussed with: CRNA  Anesthesia Plan Comments:         Anesthesia Quick Evaluation

## 2024-06-07 NOTE — Interval H&P Note (Signed)
 History and Physical Interval Note:  06/07/2024 10:43 AM  Brooke Farrell  has presented today for surgery, with the diagnosis of diarrhea, gerd.  The various methods of treatment have been discussed with the patient and family. After consideration of risks, benefits and other options for treatment, the patient has consented to  Procedure(s) with comments: COLONOSCOPY (N/A) - 11:00am, asa 1-2 EGD (ESOPHAGOGASTRODUODENOSCOPY) (N/A) as a surgical intervention.  The patient's history has been reviewed, patient examined, no change in status, stable for surgery.  I have reviewed the patient's chart and labs.  Questions were answered to the patient's satisfaction.     Deatrice FALCON Ramond Darnell

## 2024-06-07 NOTE — Discharge Instructions (Addendum)
 For treatment of EPI in chronic pancreatitis, the optimal dose is at least 40,000 units of lipase per meal. The optimal timing is while consuming a meal and not before or after.   Exocrine Pancreatic Insufficiency (EPI) is a condition in which your body doesn't provide enough pancreatic enzymes to properly digest your food (which can sometimes lead to some unpleasant digestive symptoms).  For many people, EPI is also a chronic lifelong condition.  That's why its so important to know what to expect with your EPI treatment, because with the right plan in place, EPI is manageable.  HOW DO I TAKE PANCREATIC ENZYMES?  Pancreatic Enzyme Replacement therapy (Creon ) is only available through prescription and cannot be substituted with over the counter alternatives.  Your doctor will personalize your dose based on your weight, diet, and symptoms. The number of capsules you take per meal will depend on your prescribed dose.  Creon  must be taken DURING every meal and snack.   Whether its a full meal or a snack, take Creon  every time you eat. Remember to follow your treatment plan closely and take Creon  exactly as prescribed - consistency is key!!  Dose adjustments are normal with Creon .

## 2024-06-07 NOTE — Op Note (Signed)
 [facility] Patient Name: Brooke Farrell Procedure Date: 06/07/2024 10:48 AM MRN: [id number] Date of Birth: [birth year] Attending MD: Deatrice Dine , MD, [id number] CSN: [id number] Age: 68 Admit Type: Outpatient Procedure:                Upper GI endoscopy Indications:              Follow-up of gastro-esophageal reflux disease,                            Diarrhea Providers:                Deatrice Dine, MD, Crystal Page, [place] Wilson,                            Technician Referring MD:              Medicines:                Monitored Anesthesia Care Complications:            No immediate complications. Estimated Blood Loss:     Estimated blood loss: none. Procedure:                Pre-Anesthesia Assessment:                           - Prior to the procedure, a History and Physical                            was performed, and patient medications and                            allergies were reviewed. The patient's tolerance of                            previous anesthesia was also reviewed. The risks                            and benefits of the procedure and the sedation                            options and risks were discussed with the patient.                            All questions were answered, and informed consent                            was obtained. Prior Anticoagulants: The patient has                            taken no anticoagulant or antiplatelet agents                            except for aspirin. ASA Grade Assessment: II - A                            patient with mild  systemic disease. After reviewing                            the risks and benefits, the patient was deemed in                            satisfactory condition to undergo the procedure.                           After obtaining informed consent, the endoscope was                            passed under direct vision. Throughout the                            procedure, the patient's blood  pressure, pulse, and                            oxygen saturations were monitored continuously. The                            [id number]-[id number] ([id number]) scope was introduced through the                            mouth, and advanced to the second part of duodenum.                            The upper GI endoscopy was accomplished without                            difficulty. The patient tolerated the procedure                            well. Scope In: 10:58:37 AM Scope Out: 11:05:15 AM Total Procedure Duration: 0 hours 6 minutes 38 seconds  Findings:      A 4 cm hiatal hernia was present.      Inflammation characterized by erosions was found in the gastric antrum.       Biopsies were taken with a cold forceps for histology.      Mucosal changes characterized by erosion and nodularity were found in       the gastric body. Biopsies were taken with a cold forceps for histology.      The duodenal bulb and second portion of the duodenum were normal.       Biopsies were taken with a cold forceps for histology. Impression:               - 4 cm hiatal hernia.                           - Gastritis. Biopsied.                           - Eroded and nodular mucosa in the gastric body.  Biopsied.                           - Normal duodenal bulb and second portion of the                            duodenum. Biopsied. Moderate Sedation:      Per Anesthesia Care Recommendation:           - Patient has a contact number available for                            emergencies. The signs and symptoms of potential                            delayed complications were discussed with the                            patient. Return to normal activities tomorrow.                            Written discharge instructions were provided to the                            patient.                           - Resume previous diet.                           - Continue present medications.                            - Await pathology results.                           - Patient has a contact number available for                            emergencies. The signs and symptoms of potential                            delayed complications were discussed with the                            patient. Return to normal activities tomorrow.                            Written discharge instructions were provided to the                            patient. Procedure Code(s):        --- Professional ---                           [phone], Esophagogastroduodenoscopy, flexible,  transoral; with biopsy, single or multiple Diagnosis Code(s):        --- Professional ---                           K44.9, Diaphragmatic hernia without obstruction or                            gangrene                           K29.70, Gastritis, unspecified, without bleeding                           K25.9, Gastric ulcer, unspecified as acute or                            chronic, without hemorrhage or perforation                           K31.89, Other diseases of stomach and duodenum                           K21.9, Gastro-esophageal reflux disease without                            esophagitis                           R19.7, Diarrhea, unspecified CPT copyright 2022 American [employer] Association. All rights reserved. The codes documented in this report are preliminary and upon coder review may  be revised to meet current compliance requirements. Deatrice Dine, MD Deatrice Dine, MD 06/07/2024 11:33:10 AM This report has been signed electronically. Number of Addenda: 0

## 2024-06-07 NOTE — Op Note (Signed)
 [facility] Patient Name: Brooke Farrell Procedure Date: 06/07/2024 10:43 AM MRN: [id number] Date of Birth: February 04, 1956 Attending MD: Deatrice Dine , MD, [id number] CSN: [id number] Age: 68 Admit Type: Outpatient Procedure:                Colonoscopy Indications:              Chronic diarrhea Providers:                Deatrice Dine, MD, Crystal Page, [place] Wilson,                            Technician Referring MD:              Medicines:                 Complications:            No immediate complications. Estimated Blood Loss:     Estimated blood loss: none. Procedure:                Pre-Anesthesia Assessment:                           - Prior to the procedure, a History and Physical                            was performed, and patient medications and                            allergies were reviewed. The patient's tolerance of                            previous anesthesia was also reviewed. The risks                            and benefits of the procedure and the sedation                            options and risks were discussed with the patient.                            All questions were answered, and informed consent                            was obtained. Prior Anticoagulants: The patient has                            taken no anticoagulant or antiplatelet agents. ASA                            Grade Assessment: II - A patient with mild systemic                            disease. After reviewing the risks and benefits,                            the patient was  deemed in satisfactory condition to                            undergo the procedure.                           After obtaining informed consent, the colonoscope                            was passed under direct vision. Throughout the                            procedure, the patient's blood pressure, pulse, and                            oxygen saturations were monitored continuously. The                             [id number] scope was introduced through the                            anus and advanced to the the cecum, identified by                            appendiceal orifice and ileocecal valve. The                            terminal ileum was photographed. Scope In: 11:09:33 AM Scope Out: 11:22:09 AM Scope Withdrawal Time: 0 hours 10 minutes 37 seconds  Total Procedure Duration: 0 hours 12 minutes 36 seconds  Findings:      The perianal and digital rectal examinations were normal.      There is no endoscopic evidence of inflammation in the entire colon.       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      Non-bleeding internal hemorrhoids were found during retroflexion. The       hemorrhoids were small. Impression:               - Non-bleeding internal hemorrhoids. Moderate Sedation:      Per Anesthesia Care Recommendation:           - Patient has a contact number available for                            emergencies. The signs and symptoms of potential                            delayed complications were discussed with the                            patient. Return to normal activities tomorrow.                            Written discharge instructions were provided to the  patient.                           - Resume previous diet.                           - Continue present medications.                           - Await pathology results.                           - Repeat colonoscopy in 10 years for screening                            purposes.                           - Return to GI clinic as previously scheduled. Procedure Code(s):        --- Professional ---                           [phone], Colonoscopy, flexible; with biopsy, single                            or multiple Diagnosis Code(s):        --- Professional ---                           K64.8, Other  hemorrhoids                           K52.9, Noninfective gastroenteritis and colitis,                            unspecified CPT copyright 2022 American Medical Association. All rights reserved. The codes documented in this report are preliminary and upon coder review may  be revised to meet current compliance requirements. Deatrice Dine, MD Deatrice Dine, MD 06/07/2024 11:36:41 AM This report has been signed electronically. Number of Addenda: 0

## 2024-06-07 NOTE — Anesthesia Postprocedure Evaluation (Signed)
 Anesthesia Post Note  Patient: Brooke Farrell  Procedure(s) Performed: COLONOSCOPY EGD (ESOPHAGOGASTRODUODENOSCOPY)  Patient location during evaluation: PACU Anesthesia Type: General Level of consciousness: awake and alert Pain management: pain level controlled Vital Signs Assessment: post-procedure vital signs reviewed and stable Respiratory status: spontaneous breathing, nonlabored ventilation, respiratory function stable and patient connected to nasal cannula oxygen Cardiovascular status: stable and blood pressure returned to baseline Postop Assessment: no apparent nausea or vomiting Anesthetic complications: no   No notable events documented.   Last Vitals:  Vitals:   06/07/24 1130 06/07/24 1132  BP: (!) 98/57 115/71  Pulse: 82   Resp: 15   Temp:    SpO2: 93% 94%    Last Pain:  Vitals:   06/07/24 1132  TempSrc:   PainSc: 0-No pain                 Andrea Limes

## 2024-06-08 ENCOUNTER — Telehealth (INDEPENDENT_AMBULATORY_CARE_PROVIDER_SITE_OTHER): Payer: Self-pay | Admitting: Gastroenterology

## 2024-06-08 ENCOUNTER — Encounter (HOSPITAL_COMMUNITY): Payer: Self-pay | Admitting: Gastroenterology

## 2024-06-08 LAB — SURGICAL PATHOLOGY

## 2024-06-08 NOTE — Telephone Encounter (Signed)
 Pt left voicemail stating that provider put her on Creon  and it was expensive ($900 for 30 days). Pt states she went online and spoke with the pharmaceutical company and they will be faxing over information to to enroll patient in a program to help her get Creon  for a lower cost.  Called pt back and left her a voicemail letting her know we received her message and will work on the information once we have received it.

## 2024-06-08 NOTE — Telephone Encounter (Signed)
 Forms received from [employer] Patient Assistance. Place form on provider desk for review and signature.

## 2024-06-09 ENCOUNTER — Telehealth (INDEPENDENT_AMBULATORY_CARE_PROVIDER_SITE_OTHER): Payer: Self-pay | Admitting: Gastroenterology

## 2024-06-09 NOTE — Telephone Encounter (Signed)
 Pt is aware that we have received forms for Creon . Pt will need to come by office and sign a few places. Pt is aware and will come by and sign. Will then fax with relevant information.

## 2024-06-09 NOTE — Telephone Encounter (Signed)
 Pt called to ask if we received a fax about her getting Creon  samples. She said to call her after 430pm today or early tomorrow. [phone]

## 2024-06-10 NOTE — Telephone Encounter (Signed)
 Pt came back and completed/signed form. Faxed to [employer]

## 2024-06-14 ENCOUNTER — Ambulatory Visit (INDEPENDENT_AMBULATORY_CARE_PROVIDER_SITE_OTHER): Payer: Self-pay | Admitting: Gastroenterology

## 2024-06-14 DIAGNOSIS — K52831 Collagenous colitis: Secondary | ICD-10-CM

## 2024-06-14 MED ORDER — BUDESONIDE ER 9 MG PO TB24: 9.0000 mg | ORAL_TABLET | Freq: Every day | ORAL | 0 refills | Status: DC

## 2024-06-14 NOTE — Telephone Encounter (Signed)
 Fax received from [employer] in regards to Creon ; pt application has been approved through 11/03/25. Pt contacted and made aware. Will scan approval and application in pt chart.

## 2024-06-15 ENCOUNTER — Telehealth (INDEPENDENT_AMBULATORY_CARE_PROVIDER_SITE_OTHER): Payer: Self-pay | Admitting: Gastroenterology

## 2024-06-15 NOTE — Progress Notes (Signed)
 10 yr TCS noted in recall Patient result letter mailed procedure note and pathology result faxed to PCP

## 2024-06-15 NOTE — Telephone Encounter (Signed)
 PA completed for Budesonide  9 mg tablets. PA approved-valid from 06/14/24-08/14/24; approved quantity 30 per 30 days. You may fill up to a 90 day supply except for those on Specialty Tier 5, which can be filled up to a 30 day supply.  [facility]ed [facility] and they ran the script through and states it will cost pt $294.00. Also ran GoodRx card but med is still over $200.00 Pt had left voicemail in regards to steroid that would be called in and was checking on when that would be sent in.  Contacted pt and advised that we did a PA for Budesonide  and it was approved but the cost would be $294.00. Pt states she will suck it up if it means she will get better.

## 2024-06-29 ENCOUNTER — Other Ambulatory Visit (INDEPENDENT_AMBULATORY_CARE_PROVIDER_SITE_OTHER): Payer: Self-pay | Admitting: Gastroenterology

## 2024-07-19 ENCOUNTER — Telehealth (INDEPENDENT_AMBULATORY_CARE_PROVIDER_SITE_OTHER): Payer: Self-pay | Admitting: Gastroenterology

## 2024-07-19 ENCOUNTER — Other Ambulatory Visit (INDEPENDENT_AMBULATORY_CARE_PROVIDER_SITE_OTHER): Payer: Self-pay

## 2024-07-19 DIAGNOSIS — K52831 Collagenous colitis: Secondary | ICD-10-CM

## 2024-07-19 MED ORDER — BUDESONIDE ER 9 MG PO TB24
9.0000 mg | ORAL_TABLET | Freq: Every day | ORAL | 0 refills | Status: DC
Start: 2024-07-19 — End: 2024-08-11

## 2024-07-19 NOTE — Telephone Encounter (Signed)
 Insurance will only cover 30 tablets for 30 day supply. Do we need to go ahead and decrease her to 6 mg? Please advise. Thank you!

## 2024-07-19 NOTE — Telephone Encounter (Signed)
 Pt contacted. Pt states she has 2 pills left of Budesonide . Pt will decrease to 6 mg around 08/09/24. Pt is wanting to know if we are going to send in the 6 mg dose now or wait. Pt is needing refill of 9 mg-she picked up 30 tablets in August. Please advise. Thank you!  [facility]

## 2024-07-19 NOTE — Telephone Encounter (Signed)
 She should take 9mg  for another 4 weeks ( total of 8 weeks)   Than   6mg  for 2 weeks  3 mg for next 2 weeks than discontinue

## 2024-07-19 NOTE — Telephone Encounter (Signed)
 Refill sent to pharmacy for #30. Pt has appt on 08/11/24.  Pt contacted and made aware. Pt verbalized understanding

## 2024-07-19 NOTE — Telephone Encounter (Signed)
 Hi Tanya, I had ordered the uceris  with these instructions, can you please reach out to the pharmacy  oral budesonide  9mg  daily for 6-8 weeks . This will be tapered to 6mg  for two weeks , followed by 3mg  for another two weeks and then discontinued

## 2024-07-19 NOTE — Telephone Encounter (Signed)
 Pt came to front desk to make OV and is aware. She also had questions about a prescription about decreasing the steroid dose and call [facility] a [facility]. [phone]

## 2024-07-23 ENCOUNTER — Encounter: Admitting: Family Medicine

## 2024-07-26 ENCOUNTER — Telehealth (INDEPENDENT_AMBULATORY_CARE_PROVIDER_SITE_OTHER): Payer: Self-pay | Admitting: Gastroenterology

## 2024-07-26 DIAGNOSIS — K529 Noninfective gastroenteritis and colitis, unspecified: Secondary | ICD-10-CM

## 2024-07-26 NOTE — Addendum Note (Signed)
 Addended by: CINDERELLA DEATRICE SMILES on: 07/26/2024 05:37 PM   Modules accepted: Orders

## 2024-07-26 NOTE — Telephone Encounter (Signed)
 Hi Brooke Farrell   Please advise patient to submit a stool sample to make sure were not dealing with any new infection (GI PCR)  She can take Creon  as previously prescribed  Are not sure why she is not able to get budesonide  as prescribed for management of microscopic colitis , please find out from the pharmacy and if this needs to be prescribed elsewhere

## 2024-07-26 NOTE — Telephone Encounter (Signed)
 Pt left multiple voicemail's in regard to not being able to get steroids filled. Pt states she [facility] stating they are not able to get Budesonide . Pt also states she received an email from the pharmaceutical company stated that they would be able to ship her some out in the middle of October. Pt is wanting to know if something else can be sent in. Pt diarrhea has worsened and has been to bathroom 5 times today. I did return call to pt to let her know we received her message but have been super busy and unable to answer phone or return calls as we normally would. Please advise. Thank you!

## 2024-07-27 NOTE — Telephone Encounter (Signed)
 Which one [facility] has in 3mg  capsule?

## 2024-07-27 NOTE — Telephone Encounter (Signed)
 Left message to return call

## 2024-07-27 NOTE — Telephone Encounter (Signed)
 [facility] has the regular 3 mg capsule

## 2024-07-27 NOTE — Telephone Encounter (Signed)
 Pt returned call and verbalized understanding. Pharmaceutical company can not get any Budesonide  9 mg until middle of October. Since this is a specialty drug no pharmacy has it since pharmaceutical company is on backorder. [facility] can get 3 mg capsule in generic but that is not the same at the Budesonide .

## 2024-07-28 NOTE — Telephone Encounter (Signed)
 Patient can have 9mg  for another 4 weeks , so budesonide  3mg  tables x3

## 2024-07-28 NOTE — Telephone Encounter (Signed)
 Ok for pt to have regular Budesonide  tablets? I just want to make sure Im sending in the correct medication Thank you!

## 2024-07-29 NOTE — Telephone Encounter (Signed)
 Pt contacted. Pt states she has received the 9 mg. Pt states [facility] and her husband picked it up Tuesday. Pt is wanting to know if we can go ahead and send in the next dose (6mg ) if we are going to decrease it. Pt has appt with us  on 08/11/24. Please advise. Thank you!

## 2024-07-29 NOTE — Telephone Encounter (Signed)
 I would advise patient to come see us  in clinic at that time and can continue 9mg  until than

## 2024-07-29 NOTE — Telephone Encounter (Signed)
 Pt contacted and verbalized understanding.

## 2024-08-02 ENCOUNTER — Other Ambulatory Visit: Payer: Self-pay

## 2024-08-02 DIAGNOSIS — Z0000 Encounter for general adult medical examination without abnormal findings: Secondary | ICD-10-CM

## 2024-08-11 ENCOUNTER — Ambulatory Visit (INDEPENDENT_AMBULATORY_CARE_PROVIDER_SITE_OTHER): Admitting: Gastroenterology

## 2024-08-11 ENCOUNTER — Other Ambulatory Visit (INDEPENDENT_AMBULATORY_CARE_PROVIDER_SITE_OTHER): Payer: Self-pay

## 2024-08-11 ENCOUNTER — Encounter (INDEPENDENT_AMBULATORY_CARE_PROVIDER_SITE_OTHER): Payer: Self-pay | Admitting: Gastroenterology

## 2024-08-11 ENCOUNTER — Other Ambulatory Visit (INDEPENDENT_AMBULATORY_CARE_PROVIDER_SITE_OTHER): Payer: Self-pay | Admitting: Gastroenterology

## 2024-08-11 VITALS — BP 118/68 | HR 92 | Temp 97.6°F | Ht 69.0 in | Wt 204.1 lb

## 2024-08-11 DIAGNOSIS — K8681 Exocrine pancreatic insufficiency: Secondary | ICD-10-CM | POA: Diagnosis not present

## 2024-08-11 DIAGNOSIS — K580 Irritable bowel syndrome with diarrhea: Secondary | ICD-10-CM

## 2024-08-11 DIAGNOSIS — K52831 Collagenous colitis: Secondary | ICD-10-CM

## 2024-08-11 MED ORDER — BUDESONIDE ER 9 MG PO TB24: 6.0000 mg | ORAL_TABLET | Freq: Every day | ORAL | 0 refills | Status: DC

## 2024-08-11 MED ORDER — BUDESONIDE 3 MG PO CPEP: ORAL_CAPSULE | ORAL | 0 refills | Status: DC

## 2024-08-11 NOTE — Telephone Encounter (Signed)
 Can you please ask the pharamcy to give 3mg  tablets, as I did not had option to prescribe that way  3mg  tablet x2 for 2 weeks 3mg  tablet x1 for 2 weeks

## 2024-08-11 NOTE — Patient Instructions (Signed)
 It was very nice to meet you today, as dicussed with will plan for the following :  1)  Avoid  offending agents(PPI, SSRI, NSAIDs) . Stop protonix   2)oral budesonide  9mg  daily for 6-8 weeks . 6mg  for two weeks , followed by 3mg  for another two weeks and then discontinued  3) Low FODMAP diet

## 2024-08-11 NOTE — Progress Notes (Signed)
 Brooke Farrell , M.D. Gastroenterology & Hepatology [facility] Gastroenterology [address] [place], [place] [address] Primary Care Physician: Beam, Lamar POUR, MD [address] [place] [address]  Chief Complaint:   pancreatic insufficiency and collagenous colitis  History of Present Illness: Brooke Farrell is a 68 y.o. 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  Stool studies suggest pancreatic insufficiency  Patient was previously on Vibreza , was given diagnosis of GERD and IBS-D.  Patient has seen multiple gastroenterologist previously    Fortunately today patient reports her diarrhea has resolved after 15 years.  Previously she was having 6-8 bowel movements daily and now having 2-3 bowel movements on Bristol stool scale type IV.  Patient has been taking budesonide  taper and Creon  as directed  The patient denies having any nausea, vomiting, fever, chills, hematochezia, melena, hematemesis, abdominal pain,  jaundice, pruritus or weight loss.   Labs from 01/2024 hemoglobin 14 ALT 40 Hemoglobin A1c 6.4   Last EGD: 06/2024  - 4 cm hiatal hernia. - Gastritis. Biopsied. - Eroded and nodular mucosa in the gastric body. Biopsied. - Normal duodenal bulb and second portion of the duodenum. Biopsied.  A. SMALL BOWEL, BIOPSY:  - Small intestinal mucosa with no specific histopathologic changes  - Negative for increased intraepithelial lymphocytes or villous  architectural changes   B. STOMACH, BIOPSY:  - Gastric antral mucosa with no specific histopathologic changes  - Helicobacter pylori-like organisms are not identified on routine HE  stain   C. STOMACH, ABNORMAL MUCOSA, BIOPSY:  - Gastric antral type mucosa with focal  nonspecific erosion  - Negative for intestinal metaplasia, dysplasia or malignancy   D. COLON, RANDOM, BIOPSY:  - Collagenous colitis   Last Colonoscopy:   The perianal and digital rectal examinations were normal.  There is no endoscopic evidence of inflammation in the entire colon. Biopsies for histology were taken with a cold forceps from the entire colon for evaluation of microscopic colitis.  Non- bleeding internal hemorrhoids were found during retroflexion. The hemorrhoids were small.  2018 [facility] 1.  The colonoscopy was completed to the intended extent.  An ileocecal valve photo was captured An appendiceal orifice photo was captured A terminal ileum photo was captured.   2.  There was adequate preparation of the colon to follow recommended surveillance guidelines.   3.  Colonoscopy Polyp Specimen Collection: Biopsy of a non-polyp, mucosal abnormality performed.   Colon, random, endoscopic biopsy:   Colonic mucosa with no pathologic diagnosis. No active or chronic colitis is seen. No evidence of lymphocytic or collagenous colitis is seen.   FHx: neg for any gastrointestinal/liver disease, no malignancies Social: neg smoking, alcohol or illicit drug use  Past Medical History: Past Medical History:  Diagnosis Date   Acid reflux    Allergy     Anxiety    Asthma    Cataract    Depression    Diabetes mellitus without complication (HCC)    Encounter for general adult medical examination with abnormal findings 12/12/2022   Hypertension    PONV (postoperative nausea and vomiting)    Sleep apnea     Past Surgical History: Past Surgical History:  Procedure Laterality Date   AUGMENTATION MAMMAPLASTY  1985   BREAST BIOPSY Right    2022/2023   CESAREAN SECTION  COLONOSCOPY N/A 06/07/2024   Procedure: COLONOSCOPY;  Surgeon: Cinderella Deatrice FALCON, MD;  [facility];  Service: Endoscopy;  Laterality: N/A;  11:00am, asa 1-2   ESOPHAGOGASTRODUODENOSCOPY N/A 06/07/2024    Procedure: EGD (ESOPHAGOGASTRODUODENOSCOPY);  Surgeon: Cinderella Deatrice FALCON, MD;  Location: [facility];  Service: Endoscopy;  Laterality: N/A;   EYE SURGERY Bilateral 05/01/2022   eyelid surgery   JOINT REPLACEMENT     NO PAST SURGERIES     TOTAL HIP ARTHROPLASTY  01/2020   dr. hiram    Family History: Family History  Problem Relation Age of Onset   Breast cancer Mother 36 - 80   Kidney disease Mother    Varicose Veins Mother    Cancer Father        bone   Diabetes Father    Hypertension Father    Heart attack Father    Diverticulitis Sister    Allergic rhinitis Neg Hx    Angioedema Neg Hx    Asthma Neg Hx    Atopy Neg Hx    Eczema Neg Hx    Immunodeficiency Neg Hx    Urticaria Neg Hx    Sleep apnea Neg Hx     Social History: Social History   Tobacco Use  Smoking Status Never  Smokeless Tobacco Never   Social History   Substance and Sexual Activity  Alcohol Use Yes   Comment: two drinks a month   Social History   Substance and Sexual Activity  Drug Use No    Allergies: Allergies  Allergen Reactions   Mixed Grasses Anaphylaxis and Shortness Of Breath   Other Swelling   Prednisone  Other (See Comments)    Patient reports having very bad mood swings and was very angry when on this drug   Robitussin (Alcohol Free)  [Guaifenesin ] Swelling   Sulfa Antibiotics Swelling   Amoxicillin Diarrhea    Medications: Current Outpatient Medications  Medication Sig Dispense Refill   albuterol  (VENTOLIN  HFA) 108 (90 Base) MCG/ACT inhaler Inhale 2 puffs into the lungs every 4 (four) hours as needed for wheezing or shortness of breath. 18 g 1   ALPRAZolam (XANAX) 1 MG tablet Take 1 mg by mouth at bedtime as needed.     azelastine  (ASTELIN ) 0.1 % nasal spray Place 1 spray into both nostrils daily. Use in each nostril as directed (Patient taking differently: Place 1 spray into both nostrils as needed. Use in each nostril as directed) 90 mL 3   cetirizine  (ZYRTEC ) 10 MG  tablet Take 1 tablet (10 mg total) by mouth daily as needed. 90 tablet 3   cycloSPORINE (RESTASIS) 0.05 % ophthalmic emulsion Place 1 drop into both eyes 2 (two) times daily.     Efinaconazole  10 % SOLN Apply 1 drop topically daily. 4 mL 11   EPINEPHrine  (EPIPEN  2-PAK) 0.3 mg/0.3 mL IJ SOAJ injection Inject 0.3 mg into the muscle as needed for anaphylaxis. 2 each 1   eszopiclone (LUNESTA) 1 MG TABS tablet Take 1 mg by mouth daily as needed.     fluticasone  (FLONASE ) 50 MCG/ACT nasal spray Place 2 sprays into both nostrils daily. (Patient taking differently: Place 2 sprays into both nostrils as needed.) 48 g 3   lipase/protease/amylase (CREON ) 36000 UNITS CPEP capsule Take 2 capsules (72,000 Units total) by mouth 3 (three) times daily with meals. May also take 1 capsule (36,000 Units total) as needed (with snacks - up to 4 snacks daily). 300 capsule 11   montelukast  (SINGULAIR ) 10 MG  tablet Take 1 tablet (10 mg total) by mouth at bedtime. (Patient taking differently: Take 10 mg by mouth as needed.) 90 tablet 3   pantoprazole  (PROTONIX ) 40 MG tablet TAKE 1 TABLET BY MOUTH EVERY DAY 90 tablet 1   sertraline (ZOLOFT) 100 MG tablet Take 100 mg by mouth daily. (Patient taking differently: Take 100 mg by mouth at bedtime.)     triamcinolone  (KENALOG ) 0.025 % ointment Apply 1 Application topically 2 (two) times daily. (Patient taking differently: Apply 1 Application topically as needed.) 30 g 0   Budesonide  ER (UCERIS ) 9 MG TB24 Take 0.6667 tablets (6 mg total) by mouth daily. 6mg  for two weeks , followed by 3mg  for another two weeks and then discontinued 30 tablet 0   No current facility-administered medications for this visit.    Review of Systems: GENERAL: negative for malaise, night sweats HEENT: No changes in hearing or vision, no nose bleeds or other nasal problems. NECK: Negative for lumps, goiter, pain and significant neck swelling RESPIRATORY: Negative for cough, wheezing CARDIOVASCULAR:  Negative for chest pain, leg swelling, palpitations, orthopnea GI: SEE HPI MUSCULOSKELETAL: Negative for joint pain or swelling, back pain, and muscle pain. SKIN: Negative for lesions, rash HEMATOLOGY Negative for prolonged bleeding, bruising easily, and swollen nodes. ENDOCRINE: Negative for cold or heat intolerance, polyuria, polydipsia and goiter. NEURO: negative for tremor, gait imbalance, syncope and seizures. The remainder of the review of systems is noncontributory.   Physical Exam: BP 118/68 (BP Location: Left Arm, Patient Position: Sitting, Cuff Size: Normal)   Pulse 92   Temp 97.6 F (36.4 C) (Oral)   Ht 5' 9 (1.753 m)   Wt 204 lb 1.6 oz (92.6 kg)   BMI 30.14 kg/m  GENERAL: The patient is AO x3, in no acute distress. HEENT: Head is normocephalic and atraumatic. EOMI are intact. Mouth is well hydrated and without lesions. NECK: Supple. No masses LUNGS: Clear to auscultation. No presence of rhonchi/wheezing/rales. Adequate chest expansion HEART: RRR, normal s1 and s2. ABDOMEN: Soft, nontender, no guarding, no peritoneal signs, and nondistended. BS +. No masses.   Imaging/Labs: as above     Latest Ref Rng & Units 03/18/2023    8:46 AM  CBC  WBC 3.4 - 10.8 x10E3/uL 5.7   Hemoglobin 11.1 - 15.9 g/dL 86.4   Hematocrit 65.9 - 46.6 % 40.8   Platelets 150 - 450 x10E3/uL 254    No results found for: IRON, TIBC, FERRITIN  I personally reviewed and interpreted the available labs, imaging and endoscopic files.    Pancreatic elastase 44 suggesting Severe Pancreatic Insufficiency: <100  CRP 6 Negative celiac panel Negative alpha gal        2018    IMPRESSION:  1. No acute abnormalities.  2.  Hepatic steatosis. Stable liver cyst.  3.   Hiatal hernia.  4.  Cholelithiasis without complications.   Impression and Plan:  LUNDYNN COHOON is a 68 y.o. female with PTSD on SSRI, anxiety who presents for evaluation of chronic diarrhea found to have exocrine  severe pancreatic insufficiency and collagenous colitis   #Chronic diarrhea; Insufficiency and collagenous colitis  Patient underwent bidirectional endoscopy 06/2024 biopsies are positive for collagenous colitis.  Stool studies suggest pancreatic insufficiency   Fortunately today patient reports her diarrhea has resolved after 15 years. Patient has been taking budesonide  taper and Creon  as directed  Plan  stop Protonix  and switch to famotidine  as needed Avoid  offending agents(PPI, SSRI, NSAIDs) .   Completed 9 mg  budesonide  for almost 2 months and originally tapered to 6 mg daily for 2 weeks followed by 3 mg for another 2 weeks   #Exocrine pancreatic insufficiency   Pancreatic elastase 44 suggesting Severe Pancreatic     C/w  Creon    All questions were answered.      Quashaun Lazalde Faizan Lille Karim, MD Gastroenterology and Hepatology [facility]   This chart has been completed using [facility] Dictation software, and while attempts have been made to ensure accuracy , certain words and phrases may not be transcribed as intended

## 2024-08-11 NOTE — Telephone Encounter (Signed)
 I have resubmitted the budesonide  3 mg, patient to take two (6 mg)po for 14 days, and then decrease to one tablet (3 mg) po for 14 days.

## 2024-08-11 NOTE — Telephone Encounter (Signed)
 Changed to and resubmitted Budesonide  3 mg tablets.

## 2024-08-23 ENCOUNTER — Other Ambulatory Visit: Payer: Self-pay | Admitting: Medical Genetics

## 2024-08-25 ENCOUNTER — Other Ambulatory Visit (HOSPITAL_COMMUNITY)
Admission: RE | Admit: 2024-08-25 | Discharge: 2024-08-25 | Disposition: A | Discharge status: Home / Self Care | Payer: Self-pay | Source: Ambulatory Visit | Attending: Oncology | Admitting: Oncology

## 2024-08-30 ENCOUNTER — Ambulatory Visit: Payer: Self-pay

## 2024-08-30 VITALS — BP 133/83 | HR 68 | Ht 68.0 in | Wt 205.0 lb

## 2024-08-30 DIAGNOSIS — L309 Dermatitis, unspecified: Secondary | ICD-10-CM

## 2024-08-30 DIAGNOSIS — K2100 Gastro-esophageal reflux disease with esophagitis, without bleeding: Secondary | ICD-10-CM

## 2024-08-30 DIAGNOSIS — Z7985 Long-term (current) use of injectable non-insulin antidiabetic drugs: Secondary | ICD-10-CM

## 2024-08-30 DIAGNOSIS — Z13820 Encounter for screening for osteoporosis: Secondary | ICD-10-CM

## 2024-08-30 DIAGNOSIS — E119 Type 2 diabetes mellitus without complications: Secondary | ICD-10-CM | POA: Diagnosis not present

## 2024-08-30 DIAGNOSIS — F5101 Primary insomnia: Secondary | ICD-10-CM

## 2024-08-30 DIAGNOSIS — E1169 Type 2 diabetes mellitus with other specified complication: Secondary | ICD-10-CM | POA: Insufficient documentation

## 2024-08-30 DIAGNOSIS — E785 Hyperlipidemia, unspecified: Secondary | ICD-10-CM

## 2024-08-30 DIAGNOSIS — J302 Other seasonal allergic rhinitis: Secondary | ICD-10-CM

## 2024-08-30 DIAGNOSIS — F419 Anxiety disorder, unspecified: Secondary | ICD-10-CM

## 2024-08-30 DIAGNOSIS — J3089 Other allergic rhinitis: Secondary | ICD-10-CM

## 2024-08-30 DIAGNOSIS — E66811 Obesity, class 1: Secondary | ICD-10-CM | POA: Insufficient documentation

## 2024-08-30 DIAGNOSIS — N951 Menopausal and female climacteric states: Secondary | ICD-10-CM

## 2024-08-30 DIAGNOSIS — K8681 Exocrine pancreatic insufficiency: Secondary | ICD-10-CM

## 2024-08-30 MED ORDER — CETIRIZINE HCL 10 MG PO TABS: 10.0000 mg | ORAL_TABLET | Freq: Every day | ORAL | 3 refills | Status: AC | PRN

## 2024-08-30 MED ORDER — PANTOPRAZOLE SODIUM 40 MG PO TBEC: 40.0000 mg | DELAYED_RELEASE_TABLET | Freq: Every day | ORAL | 1 refills | Status: DC

## 2024-08-30 MED ORDER — ESZOPICLONE 1 MG PO TABS: 1.0000 mg | ORAL_TABLET | Freq: Every day | ORAL | 2 refills | Status: AC | PRN

## 2024-08-30 MED ORDER — TRIAMCINOLONE ACETONIDE 0.025 % EX OINT
1.0000 | TOPICAL_OINTMENT | Freq: Two times a day (BID) | CUTANEOUS | 5 refills | Status: AC
Start: 2024-08-30 — End: ?

## 2024-08-30 MED ORDER — SERTRALINE HCL 100 MG PO TABS: 100.0000 mg | ORAL_TABLET | Freq: Every day | ORAL | 1 refills | Status: AC

## 2024-08-30 NOTE — Assessment & Plan Note (Signed)
 Check fasting labs today.  She is not currently on cholesterol medicine.

## 2024-08-30 NOTE — Assessment & Plan Note (Signed)
 Elevated A1c indicates poor glycemic control. Previously benefited from Mounjaro  for weight and blood pressure control. Interested in resuming if A1c remains high and affordable. - Order blood work for A1c and pancreatic enzyme levels. - Consider Mounjaro  if A1c elevated and affordable.

## 2024-08-30 NOTE — Assessment & Plan Note (Signed)
 Pancreatic enzyme levels low. On Creon , seven capsules daily. Cost high but study assists with expense. - Continue Creon  as prescribed.

## 2024-08-30 NOTE — Assessment & Plan Note (Signed)
 Well controlled with cetirizine .  Refills provided.

## 2024-08-30 NOTE — Assessment & Plan Note (Signed)
 Well-controlled with as needed use of Lunesta.  Refills provided today.  PDMP was reviewed.

## 2024-08-30 NOTE — Assessment & Plan Note (Signed)
 Continue to work on weight loss with a healthy diet and regular exercise, aiming for 150 minutes of physical activity a week.

## 2024-08-30 NOTE — Progress Notes (Signed)
 Established Patient Office Visit  Subjective   Patient ID: Brooke Farrell, female    DOB: 01/23/1956  Age: 68 y.o. MRN: [id number]  Chief Complaint  Patient presents with   Medical Management of Chronic Issues    TOC Visit also pt wants to discuss a bone density test     HPI Discussed the use of AI scribe software for clinical note transcription with the patient, who gave verbal consent to proceed.  History of Present Illness   Brooke Farrell is a 68 year old female with diabetes and microscopic colitis who presents for a follow-up on her blood sugar levels and pancreatic enzyme function.  Hyperglycemia and diabetes management - Diabetes with previously elevated hemoglobin A1c levels - Initial treatment included cholesterol medication despite normocholesterolemia, which caused an adverse reaction and was discontinued - Persistent hyperglycemia led to initiation of Mounjaro , resulting in weight loss and improved blood pressure control  Pancreatic exocrine insufficiency and microscopic colitis - Microscopic colitis with ongoing symptoms - Pancreatic exocrine insufficiency with significantly low pancreatic enzyme levels - Currently taking Creon ; cost is a concern, but participating in a study to assist with expenses - On a steroid taper following a three-month course  Mood disorder and psychotropic medication tolerance - Diagnosed with bipolar disorder, though diagnosis is questioned due to age - Previously treated with Prozac  for depression, later switched to a bipolar medication that was not well-tolerated - Currently taking Zoloft with good effect  Ophthalmologic issues - Scheduled for cataract surgery next week - History of retinal pathology in the right eye requiring laser treatment - Chronic dry eyes, with planned surgical intervention next week  Obstructive sleep apnea - Uses CPAP machine for management of obstructive sleep apnea  Sleep disturbance - Uses Lunesta  for sleep, particularly during periods of increased stress  Immunization status and preventive care - Up to date on vaccinations except uncertain about last tetanus immunization - Due for bone density testing and requires referral  Psychosocial stressors - Experiencing increased stress related to family dynamics, specifically stepson's recent separation and return home      Patient Active Problem List   Diagnosis Date Noted   Hyperlipidemia associated with type 2 diabetes mellitus (HCC) 08/30/2024   Obesity, Class I, BMI 30-34.9 08/30/2024   Exocrine pancreatic insufficiency 08/11/2024   Collagenous colitis 08/11/2024   Gastritis and gastroduodenitis 06/07/2024   Hiatal hernia 06/07/2024   Diarrhea due to malabsorption 06/07/2024   Chronic diarrhea 05/17/2024   Metabolic dysfunction-associated steatotic liver disease (MASLD) 05/17/2024   Spinal stenosis of lumbar region 02/25/2024   Lumbar radiculopathy 09/16/2023   Scoliosis of lumbar spine 05/20/2023   Abnormal weight gain 04/28/2023   Aphthous ulcer 04/28/2023   Change in bowel habit 04/28/2023   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   Fatty liver 04/28/2023   Flatulence, eructation and gas pain 04/28/2023   Gastroesophageal reflux disease 04/28/2023   Obesity 04/28/2023   Rectal bleeding 04/28/2023   Eczema 03/18/2023   Encounter for general adult medical examination with abnormal findings 12/12/2022   Low back pain 12/02/2022   Type 2 diabetes mellitus without complications (HCC) 11/19/2022   Anxiety and depression 11/19/2022   Cholelithiasis without obstruction 12/16/2021   Duodenitis 12/16/2021   Dysphagia 12/16/2021   Pharyngeal dysphagia 12/16/2021   Onychomycosis 11/21/2021   Primary insomnia 09/14/2019   OSA on CPAP 09/10/2018   Seasonal and perennial allergic rhinitis 08/26/2018   Mild intermittent asthma, uncomplicated 08/26/2018   IBS (irritable bowel syndrome)  03/02/2018   Gastroesophageal reflux  disease with esophagitis 01/16/2015   H/O bilateral breast implants 01/21/2013   HPV (human papilloma virus) anogenital infection 05/25/2012    ROS    Objective:     BP 133/83   Pulse 68   Ht 5' 8 (1.727 m)   Wt 205 lb (93 kg)   SpO2 91%   BMI 31.17 kg/m  BP Readings from Last 3 Encounters:  08/30/24 133/83  08/11/24 118/68  06/07/24 115/71   Wt Readings from Last 3 Encounters:  08/30/24 205 lb (93 kg)  08/11/24 204 lb 1.6 oz (92.6 kg)  06/07/24 200 lb (90.7 kg)      Physical Exam Vitals and nursing note reviewed.  Constitutional:      Appearance: Normal appearance. She is obese.  HENT:     Head: Normocephalic.  Eyes:     Extraocular Movements: Extraocular movements intact.     Pupils: Pupils are equal, round, and reactive to light.  Cardiovascular:     Rate and Rhythm: Normal rate and regular rhythm.  Pulmonary:     Effort: Pulmonary effort is normal.     Breath sounds: Normal breath sounds.  Musculoskeletal:     Cervical back: Normal range of motion and neck supple.  Neurological:     Mental Status: She is alert and oriented to person, place, and time.  Psychiatric:        Mood and Affect: Mood normal.        Thought Content: Thought content normal.      No results found for any visits on 08/30/24.  Last CBC Lab Results  Component Value Date   WBC 5.7 03/18/2023   HGB 13.5 03/18/2023   HCT 40.8 03/18/2023   MCV 84 03/18/2023   MCH 27.8 03/18/2023   RDW 13.0 03/18/2023   PLT 254 03/18/2023   Last metabolic panel Lab Results  Component Value Date   GLUCOSE 95 03/18/2023   NA 139 03/18/2023   K 5.0 03/18/2023   CL 103 03/18/2023   CO2 24 03/18/2023   BUN 13 03/18/2023   CREATININE 0.92 03/18/2023   EGFR 68 03/18/2023   CALCIUM  9.5 03/18/2023   PROT 6.9 03/18/2023   ALBUMIN 4.2 03/18/2023   LABGLOB 2.7 03/18/2023   AGRATIO 1.6 03/18/2023   BILITOT 0.3 03/18/2023   ALKPHOS 64 03/18/2023   AST 40 03/18/2023   ALT 43 (H) 03/18/2023    Last lipids Lab Results  Component Value Date   CHOL 128 03/18/2023   HDL 53 03/18/2023   LDLCALC 61 03/18/2023   TRIG 70 03/18/2023   CHOLHDL 2.4 03/18/2023   Last hemoglobin A1c Lab Results  Component Value Date   HGBA1C 6.4 (H) 03/18/2023   Last thyroid functions Lab Results  Component Value Date   TSH 2.060 03/18/2023   FREET4 1.23 03/18/2023   Last vitamin D  Lab Results  Component Value Date   VD25OH 48.0 03/18/2023   Last vitamin B12 and Folate Lab Results  Component Value Date   VITAMINB12 683 03/18/2023   FOLATE >20.0 03/18/2023      The 10-year ASCVD risk score (Arnett DK, et al., 2019) is: 18%    Assessment & Plan:   Problem List Items Addressed This Visit       Respiratory   Seasonal and perennial allergic rhinitis   Well controlled with cetirizine .  Refills provided.       Relevant Medications   cetirizine  (ZYRTEC ) 10 MG tablet  Digestive   Gastroesophageal reflux disease with esophagitis   Symptoms are currently well-controlled with Protonix  40 mg daily.  No changes today.      Relevant Medications   pantoprazole  (PROTONIX ) 40 MG tablet   Exocrine pancreatic insufficiency   Pancreatic enzyme levels low. On Creon , seven capsules daily. Cost high but study assists with expense. - Continue Creon  as prescribed.      Relevant Orders   Amylase   Lipase     Endocrine   Type 2 diabetes mellitus without complications (HCC) - Primary   Elevated A1c indicates poor glycemic control. Previously benefited from Mounjaro  for weight and blood pressure control. Interested in resuming if A1c remains high and affordable. - Order blood work for A1c and pancreatic enzyme levels. - Consider Mounjaro  if A1c elevated and affordable.      Relevant Orders   CMP14+EGFR   HgB A1c   Hyperlipidemia associated with type 2 diabetes mellitus (HCC)   Check fasting labs today.  She is not currently on cholesterol medicine.      Relevant Orders    CMP14+EGFR   Lipid Profile   TSH + free T4     Musculoskeletal and Integument   Eczema   Prescribed triamcinolone  cream for as needed application.      Relevant Medications   triamcinolone  (KENALOG ) 0.025 % ointment     Other   Primary insomnia   Well-controlled with as needed use of Lunesta.  Refills provided today.  PDMP was reviewed.      Relevant Medications   eszopiclone (LUNESTA) 1 MG TABS tablet   Anxiety and depression   Zoloft is effective and well-tolerated. - Continue Zoloft as prescribed.  Refills provided.      Relevant Medications   sertraline (ZOLOFT) 100 MG tablet   Obesity, Class I, BMI 30-34.9   Continue to work on weight loss with a healthy diet and regular exercise, aiming for 150 minutes of physical activity a week.       Relevant Orders   TSH + free T4   Other Visit Diagnoses       Menopausal and female climacteric states       Bone density exam ordered   Relevant Orders   DG Bone Density     Screening for osteoporosis       Bone density exam ordered   Relevant Orders   DG Bone Density       Return in about 6 months (around 02/28/2025) for chronic follow-up with PCP.    Leita Longs, FNP

## 2024-08-30 NOTE — Assessment & Plan Note (Signed)
Symptoms are currently well-controlled with Protonix 40 mg daily.  No changes today. 

## 2024-08-30 NOTE — Assessment & Plan Note (Signed)
 Prescribed triamcinolone  cream for as needed application.

## 2024-08-30 NOTE — Assessment & Plan Note (Addendum)
 Zoloft is effective and well-tolerated. - Continue Zoloft as prescribed.  Refills provided.

## 2024-09-01 ENCOUNTER — Telehealth: Payer: Self-pay

## 2024-09-01 LAB — CMP14+EGFR
ALT: 42 IU/L — ABNORMAL HIGH (ref 0–32)
AST: 30 IU/L (ref 0–40)
Albumin: 4.3 g/dL (ref 3.9–4.9)
Alkaline Phosphatase: 71 IU/L (ref 49–135)
BUN/Creatinine Ratio: 13 (ref 12–28)
BUN: 15 mg/dL (ref 8–27)
Bilirubin Total: 0.4 mg/dL (ref 0.0–1.2)
CO2: 24 mmol/L (ref 20–29)
Calcium: 9.4 mg/dL (ref 8.7–10.3)
Chloride: 102 mmol/L (ref 96–106)
Creatinine, Ser: 1.12 mg/dL — ABNORMAL HIGH (ref 0.57–1.00)
Globulin, Total: 2.6 g/dL (ref 1.5–4.5)
Glucose: 101 mg/dL — ABNORMAL HIGH (ref 70–99)
Potassium: 4.7 mmol/L (ref 3.5–5.2)
Sodium: 140 mmol/L (ref 134–144)
Total Protein: 6.9 g/dL (ref 6.0–8.5)
eGFR: 54 mL/min/1.73 — ABNORMAL LOW (ref >59)

## 2024-09-01 LAB — HEMOGLOBIN A1C
Est. average glucose Bld gHb Est-mCnc: 134 mg/dL
Hgb A1c MFr Bld: 6.3 % — ABNORMAL HIGH (ref 4.8–5.6)

## 2024-09-01 LAB — TSH+FREE T4
Free T4: 1.27 ng/dL (ref 0.82–1.77)
TSH: 2.03 u[IU]/mL (ref 0.450–4.500)

## 2024-09-01 LAB — LIPID PANEL
Chol/HDL Ratio: 2.5 ratio (ref 0.0–4.4)
Cholesterol, Total: 166 mg/dL (ref 100–199)
HDL: 66 mg/dL (ref >39)
LDL Chol Calc (NIH): 83 mg/dL (ref 0–99)
Triglycerides: 95 mg/dL (ref 0–149)
VLDL Cholesterol Cal: 17 mg/dL (ref 5–40)

## 2024-09-01 LAB — LIPASE: Lipase: 27 U/L (ref 14–72)

## 2024-09-01 LAB — AMYLASE: Amylase: 55 U/L (ref 31–110)

## 2024-09-01 NOTE — Telephone Encounter (Signed)
 Copied from CRM [id number]. Topic: General - Other >> Sep 01, 2024 10:07 AM Zebedee SAUNDERS wrote: Reason for CRM: Pt would like to discuss in detail her lab results and next steps. Please contact pt via MyChart.

## 2024-09-02 ENCOUNTER — Other Ambulatory Visit (INDEPENDENT_AMBULATORY_CARE_PROVIDER_SITE_OTHER): Payer: Self-pay | Admitting: Gastroenterology

## 2024-09-02 ENCOUNTER — Ambulatory Visit: Payer: Self-pay

## 2024-09-03 LAB — GENECONNECT MOLECULAR SCREEN: Genetic Analysis Overall Interpretation: NEGATIVE

## 2024-09-13 ENCOUNTER — Other Ambulatory Visit: Payer: Self-pay

## 2024-09-13 DIAGNOSIS — E119 Type 2 diabetes mellitus without complications: Secondary | ICD-10-CM

## 2024-09-13 MED ORDER — TIRZEPATIDE 2.5 MG/0.5ML ~~LOC~~ SOAJ: 2.5000 mg | SUBCUTANEOUS | 2 refills | Status: DC

## 2024-09-19 ENCOUNTER — Other Ambulatory Visit

## 2024-09-19 ENCOUNTER — Ambulatory Visit
Admission: RE | Admit: 2024-09-19 | Discharge: 2024-09-19 | Disposition: A | Discharge status: Home / Self Care | Payer: Self-pay | Attending: Family Medicine | Admitting: Family Medicine

## 2024-09-19 ENCOUNTER — Ambulatory Visit: Payer: Self-pay | Admitting: Family Medicine

## 2024-09-19 ENCOUNTER — Ambulatory Visit

## 2024-09-19 VITALS — BP 138/82 | HR 69 | Temp 98.5°F | Resp 18

## 2024-09-19 DIAGNOSIS — W19XXXA Unspecified fall, initial encounter: Secondary | ICD-10-CM | POA: Diagnosis not present

## 2024-09-19 DIAGNOSIS — R0789 Other chest pain: Secondary | ICD-10-CM

## 2024-09-19 MED ORDER — TIZANIDINE HCL 4 MG PO TABS: 4.0000 mg | ORAL_TABLET | Freq: Three times a day (TID) | ORAL | 0 refills | Status: AC | PRN

## 2024-09-19 MED ORDER — LIDOCAINE 5 % EX PTCH: 1.0000 | MEDICATED_PATCH | CUTANEOUS | 0 refills | Status: AC

## 2024-09-19 NOTE — ED Provider Notes (Signed)
 [facility]    CSN: [id number] Arrival date & time: 09/19/24  1028      History   Chief Complaint Chief Complaint  Patient presents with   Chest Injury    fell on wed nov 12 my ribs hurt - Entered by patient    HPI Brooke Farrell is a 68 y.o. female.   Patient presenting today with left anterior rib pain following a fall 5 days ago.  Denies bruising, swelling, shortness of breath, wheezing, dizziness, head injury, loss of consciousness.  So far not trying anything over-the-counter for symptoms.    Past Medical History:  Diagnosis Date   Acid reflux    Allergy     Anxiety    Asthma    Cataract    Depression    Diabetes mellitus without complication (HCC)    Encounter for general adult medical examination with abnormal findings 12/12/2022   Hyperlipidemia associated with type 2 diabetes mellitus (HCC) 08/30/2024   Hypertension    PONV (postoperative nausea and vomiting)    Sleep apnea     Patient Active Problem List   Diagnosis Date Noted   Hyperlipidemia associated with type 2 diabetes mellitus (HCC) 08/30/2024   Obesity, Class I, BMI 30-34.9 08/30/2024   Exocrine pancreatic insufficiency 08/11/2024   Collagenous colitis 08/11/2024   Gastritis and gastroduodenitis 06/07/2024   Hiatal hernia 06/07/2024   Diarrhea due to malabsorption 06/07/2024   Chronic diarrhea 05/17/2024   Metabolic dysfunction-associated steatotic liver disease (MASLD) 05/17/2024   Spinal stenosis of lumbar region 02/25/2024   Lumbar radiculopathy 09/16/2023   Scoliosis of lumbar spine 05/20/2023   Abnormal weight gain 04/28/2023   Aphthous ulcer 04/28/2023   Change in bowel habit 04/28/2023   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   Fatty liver 04/28/2023   Flatulence, eructation and gas pain 04/28/2023   Gastroesophageal reflux disease 04/28/2023   Obesity 04/28/2023   Rectal bleeding 04/28/2023   Eczema 03/18/2023   Encounter for general adult medical examination  with abnormal findings 12/12/2022   Low back pain 12/02/2022   Type 2 diabetes mellitus without complications (HCC) 11/19/2022   Anxiety and depression 11/19/2022   Cholelithiasis without obstruction 12/16/2021   Duodenitis 12/16/2021   Dysphagia 12/16/2021   Pharyngeal dysphagia 12/16/2021   Onychomycosis 11/21/2021   Primary insomnia 09/14/2019   OSA on CPAP 09/10/2018   Seasonal and perennial allergic rhinitis 08/26/2018   Mild intermittent asthma, uncomplicated 08/26/2018   IBS (irritable bowel syndrome) 03/02/2018   Gastroesophageal reflux disease with esophagitis 01/16/2015   H/O bilateral breast implants 01/21/2013   HPV (human papilloma virus) anogenital infection 05/25/2012    Past Surgical History:  Procedure Laterality Date   AUGMENTATION MAMMAPLASTY  1985   BREAST BIOPSY Right    2022/2023   CESAREAN SECTION     COLONOSCOPY N/A 06/07/2024   Procedure: COLONOSCOPY;  Surgeon: Cinderella Deatrice FALCON, MD;  Location: [facility];  Service: Endoscopy;  Laterality: N/A;  11:00am, asa 1-2   ESOPHAGOGASTRODUODENOSCOPY N/A 06/07/2024   Procedure: EGD (ESOPHAGOGASTRODUODENOSCOPY);  Surgeon: Cinderella Deatrice FALCON, MD;  Location: [facility];  Service: Endoscopy;  Laterality: N/A;   EYE SURGERY Bilateral 05/01/2022   eyelid surgery   JOINT REPLACEMENT     NO PAST SURGERIES     TOTAL HIP ARTHROPLASTY  01/2020   dr. hiram    OB History     Gravida  3   Para  1   Term  1   Preterm  AB  2   Living  1      SAB      IAB      Ectopic      Multiple      Live Births  1            Home Medications    Prior to Admission medications   Medication Sig Start Date End Date Taking? Authorizing Provider  lidocaine  (LIDODERM ) 5 % Place 1 patch onto the skin daily. Remove & Discard patch within 12 hours or as directed by MD 09/19/24  Yes Stuart Vernell Norris, PA-C  tiZANidine  (ZANAFLEX ) 4 MG tablet Take 1 tablet (4 mg total) by mouth every 8 (eight) hours as needed  for muscle spasms. Do not drink alcohol or drive while taking this medication.  May cause drowsiness. 09/19/24  Yes Stuart Vernell Norris, PA-C  albuterol  (VENTOLIN  HFA) 108 [phone] Base) MCG/ACT inhaler Inhale 2 puffs into the lungs every 4 (four) hours as needed for wheezing or shortness of breath. 02/19/23   Iva Marty Saltness, MD  budesonide  (ENTOCORT EC ) 3 MG 24 hr capsule TAKE TWO TABLETS DAILY FOR 14 DAYS, THEN DECREASE TO 1 TABLET FOR 14 DAYS. 09/03/24   Ahmed, Deatrice FALCON, MD  cetirizine  (ZYRTEC ) 10 MG tablet Take 1 tablet (10 mg total) by mouth daily as needed. 08/30/24 11/28/24  Bevely Doffing, FNP  cycloSPORINE (RESTASIS) 0.05 % ophthalmic emulsion Place 1 drop into both eyes 2 (two) times daily.    [provider]  Efinaconazole  10 % SOLN Apply 1 drop topically daily. 09/05/22   Gershon Donnice SAUNDERS, DPM  EPINEPHrine  (EPIPEN  2-PAK) 0.3 mg/0.3 mL IJ SOAJ injection Inject 0.3 mg into the muscle as needed for anaphylaxis. 09/10/23   Iva Marty Saltness, MD  eszopiclone (LUNESTA) 1 MG TABS tablet Take 1 tablet (1 mg total) by mouth daily as needed. 08/30/24   Bevely Doffing, FNP  fluticasone  (FLONASE ) 50 MCG/ACT nasal spray Place 2 sprays into both nostrils daily. Patient taking differently: Place 2 sprays into both nostrils as needed. 02/19/23   Iva Marty Saltness, MD  lipase/protease/amylase (CREON ) 36000 UNITS CPEP capsule Take 2 capsules (72,000 Units total) by mouth 3 (three) times daily with meals. May also take 1 capsule (36,000 Units total) as needed (with snacks - up to 4 snacks daily). 06/01/24   Ahmed, Deatrice FALCON, MD  montelukast  (SINGULAIR ) 10 MG tablet Take 1 tablet (10 mg total) by mouth at bedtime. Patient taking differently: Take 10 mg by mouth as needed. 02/25/24 08/11/24  Iva Marty Saltness, MD  pantoprazole  (PROTONIX ) 40 MG tablet Take 1 tablet (40 mg total) by mouth daily. 08/30/24   Bevely Doffing, FNP  sertraline (ZOLOFT) 100 MG tablet Take 1 tablet (100 mg total)  by mouth daily. 08/30/24   Bevely Doffing, FNP  tirzepatide  (MOUNJARO ) 2.5 MG/0.5ML Pen Inject 2.5 mg into the skin once a week. 09/13/24   Bevely Doffing, FNP  triamcinolone  (KENALOG ) 0.025 % ointment Apply 1 Application topically 2 (two) times daily. 08/30/24   Bevely Doffing, FNP    Family History Family History  Problem Relation Age of Onset   Breast cancer Mother 67 - 44   Kidney disease Mother    Varicose Veins Mother    Cancer Father        bone   Diabetes Father    Hypertension Father    Heart attack Father    Diverticulitis Sister    Allergic rhinitis Neg Hx    Angioedema Neg Hx  Asthma Neg Hx    Atopy Neg Hx    Eczema Neg Hx    Immunodeficiency Neg Hx    Urticaria Neg Hx    Sleep apnea Neg Hx     Social History Social History   Tobacco Use   Smoking status: Never   Smokeless tobacco: Never  Vaping Use   Vaping status: Never Used  Substance Use Topics   Alcohol use: Yes    Comment: two drinks a month   Drug use: No     Allergies   Clavulanic acid, Mixed grasses, Other, Prednisone , Robitussin (alcohol free)  [guaifenesin ], Sulfa antibiotics, and Amoxicillin   Review of Systems Review of Systems Per HPI  Physical Exam Triage Vital Signs ED Triage Vitals  Encounter Vitals Group     BP 09/19/24 1050 138/82     Girls Systolic BP Percentile --      Girls Diastolic BP Percentile --      Boys Systolic BP Percentile --      Boys Diastolic BP Percentile --      Pulse Rate 09/19/24 1050 69     Resp 09/19/24 1050 18     Temp 09/19/24 1050 98.5 F (36.9 C)     Temp Source 09/19/24 1050 Oral     SpO2 09/19/24 1050 94 %     Weight --      Height --      Head Circumference --      Peak Flow --      Pain Score 09/19/24 1053 8     Pain Loc --      Pain Education --      Exclude from Growth Chart --    No data found.  Updated Vital Signs BP 138/82 (BP Location: Right Arm)   Pulse 69   Temp 98.5 F (36.9 C) (Oral)   Resp 18   SpO2 94%    Visual Acuity Right Eye Distance:   Left Eye Distance:   Bilateral Distance:    Right Eye Near:   Left Eye Near:    Bilateral Near:     Physical Exam Vitals and nursing note reviewed.  Constitutional:      Appearance: Normal appearance. She is not ill-appearing.  HENT:     Head: Atraumatic.  Eyes:     Extraocular Movements: Extraocular movements intact.     Conjunctiva/sclera: Conjunctivae normal.  Cardiovascular:     Rate and Rhythm: Normal rate and regular rhythm.     Heart sounds: Normal heart sounds.  Pulmonary:     Effort: Pulmonary effort is normal.     Breath sounds: Normal breath sounds.     Comments: Chest rise symmetric bilaterally Musculoskeletal:        General: Tenderness and signs of injury present. No swelling or deformity. Normal range of motion.     Cervical back: Normal range of motion and neck supple.     Comments: Left anterior rib tenderness to palpation without bony deformity palpable, and no edema, chest rise symmetric bilaterally  Skin:    General: Skin is warm and dry.     Findings: No bruising.  Neurological:     Mental Status: She is alert and oriented to person, place, and time.  Psychiatric:        Mood and Affect: Mood normal.        Thought Content: Thought content normal.        Judgment: Judgment normal.      UC Treatments /  Results  Labs (all labs ordered are listed, but only abnormal results are displayed) Labs Reviewed - No data to display  EKG   Radiology DG Ribs Unilateral W/Chest Left Result Date: 09/19/2024 EXAM: 1 [facility] VIEW XRAY OF THE LEFT RIBS AND CHEST 09/19/2024 11:26:57 AM COMPARISON: 10/21/2005 CLINICAL HISTORY: left anterior rib pain after fall yesterday FINDINGS: BONES: No acute displaced rib fracture. LUNGS AND PLEURA: No consolidation or pulmonary edema. No pleural effusion or pneumothorax. HEART AND MEDIASTINUM: Small hiatal hernia. No acute abnormality of the cardiac and mediastinal silhouettes. SOFT TISSUES:  Calcified breast prostheses. IMPRESSION: 1. No acute left rib fracture. Electronically signed by: Fonda Field MD 09/19/2024 11:33 AM EST RP Workstation: [id number]    Procedures Procedures (including critical care time)  Medications Ordered in UC Medications - No data to display  Initial Impression / Assessment and Plan / UC Course  I have reviewed the triage vital signs and the nursing notes.  Pertinent labs & imaging results that were available during my care of the patient were reviewed by me and considered in my medical decision making (see chart for details).     Vitals and exam very reassuring, chest and left rib x-ray negative for acute bony abnormality.  Discussed lidocaine  patches, Zanaflex , heat, massage, rest.  Return for worsening or unresolving symptoms. Final Clinical Impressions(s) / UC Diagnoses   Final diagnoses:  Rib pain on left side  Fall, initial encounter     Discharge Instructions      You may use heat, warm Epsom salt soaks, rest, and the lidocaine  patches and muscle relaxer as needed that have been prescribed.  Over-the-counter pain relievers additionally as needed.  Return for worsening or unresolving symptoms.  We will give you a call if anything comes back abnormal on your x-ray    ED Prescriptions     Medication Sig Dispense Auth. Provider   lidocaine  (LIDODERM ) 5 % Place 1 patch onto the skin daily. Remove & Discard patch within 12 hours or as directed by MD 30 patch Stuart Vernell Norris, PA-C   tiZANidine  (ZANAFLEX ) 4 MG tablet Take 1 tablet (4 mg total) by mouth every 8 (eight) hours as needed for muscle spasms. Do not drink alcohol or drive while taking this medication.  May cause drowsiness. 15 tablet Stuart Vernell Norris, [place]      PDMP not reviewed this encounter.   Stuart Vernell Norris, [place] 09/19/24 1148

## 2024-09-19 NOTE — ED Triage Notes (Signed)
 Pt reports Fall on Wednesday pain in left flank under the breast. Pt states she was walking and tried to jump over a pile of walnuts and landed on the stick which caused her to fall

## 2024-09-19 NOTE — Discharge Instructions (Signed)
 You may use heat, warm Epsom salt soaks, rest, and the lidocaine  patches and muscle relaxer as needed that have been prescribed.  Over-the-counter pain relievers additionally as needed.  Return for worsening or unresolving symptoms.  We will give you a call if anything comes back abnormal on your x-ray

## 2024-10-04 ENCOUNTER — Ambulatory Visit (HOSPITAL_COMMUNITY)
Admission: RE | Admit: 2024-10-04 | Discharge: 2024-10-04 | Disposition: A | Discharge status: Home / Self Care | Source: Ambulatory Visit

## 2024-10-04 DIAGNOSIS — N951 Menopausal and female climacteric states: Secondary | ICD-10-CM | POA: Diagnosis present

## 2024-10-04 DIAGNOSIS — Z780 Asymptomatic menopausal state: Secondary | ICD-10-CM | POA: Insufficient documentation

## 2024-10-04 DIAGNOSIS — Z13820 Encounter for screening for osteoporosis: Secondary | ICD-10-CM | POA: Insufficient documentation

## 2024-10-11 ENCOUNTER — Telehealth (INDEPENDENT_AMBULATORY_CARE_PROVIDER_SITE_OTHER): Payer: Self-pay | Admitting: Gastroenterology

## 2024-10-11 ENCOUNTER — Encounter (INDEPENDENT_AMBULATORY_CARE_PROVIDER_SITE_OTHER): Payer: Self-pay

## 2024-10-11 NOTE — Telephone Encounter (Signed)
 Sent mychart message to patient

## 2024-10-11 NOTE — Telephone Encounter (Signed)
 Pt contacted and verbalized understanding. Pt will wait for recommendations from Dr.Ahmed also

## 2024-10-11 NOTE — Telephone Encounter (Signed)
 Pt left voicemail stating that she is on Creon  but has noticed she is having achy joints, swollen joints, blurred vision and nausea. Pt questioned whether she should continue Creon  or stop it. Attempted to return call to pt to get more information but had to leave voicemail to return call

## 2024-10-11 NOTE — Telephone Encounter (Signed)
 Rarely, Creon  can cause muscle pain but no joint pain per se.  As she is negative Mounjaro , it is possible this is causing her nausea. Forwarding to Dr. Cinderella.

## 2024-10-11 NOTE — Telephone Encounter (Signed)
 Pt left voicemail returning call. Returned call to patient. Patient states she noticed the symptoms about 2 weeks ago. Went to Urgent Care due to knee swelling. Urgent Care states pt has arthritis and bone spurs. Pt has been on Creon  since late July/Early August. Pt did start Mounjaro  about 5 weeks ago. Pt reports with her nausea she does vomit at times. Pt reports no shortness of breath, throat closing or trouble breathing. Please advise. Thank you!

## 2024-10-20 NOTE — Telephone Encounter (Signed)
 Please advise. Thank you

## 2024-10-22 NOTE — Telephone Encounter (Signed)
 Please advice I reached out to the drug representative and confirmed that muscle pain and joint pain is not listed side effects of this medication .

## 2024-10-25 NOTE — Telephone Encounter (Signed)
 Left detailed message on pt voicemail (ok per dpr)

## 2024-10-27 ENCOUNTER — Encounter (INDEPENDENT_AMBULATORY_CARE_PROVIDER_SITE_OTHER): Payer: Self-pay

## 2024-10-27 NOTE — Telephone Encounter (Signed)
 Mychart message sent to patient.

## 2024-11-01 NOTE — Telephone Encounter (Signed)
 Last read by Ilse H Gherardi at 4:54PM on 10/27/2024.

## 2024-11-15 ENCOUNTER — Telehealth: Payer: Self-pay

## 2024-11-15 NOTE — Telephone Encounter (Signed)
 Copied from CRM [id number]. Topic: Clinical - Lab/Test Results >> Nov 15, 2024 10:53 AM Brooke Farrell wrote: Reason for CRM: Patient is inquiring if we can do labs on her f/u appt 4/28.. ( diabetes )

## 2024-11-15 NOTE — Telephone Encounter (Signed)
Will discuss at appointment time.

## 2024-11-22 ENCOUNTER — Ambulatory Visit (INDEPENDENT_AMBULATORY_CARE_PROVIDER_SITE_OTHER): Admitting: Gastroenterology

## 2024-11-22 ENCOUNTER — Encounter (INDEPENDENT_AMBULATORY_CARE_PROVIDER_SITE_OTHER): Payer: Self-pay | Admitting: Gastroenterology

## 2024-11-22 VITALS — BP 115/75 | HR 73 | Temp 97.2°F | Ht 68.0 in | Wt 202.1 lb

## 2024-11-22 DIAGNOSIS — K8681 Exocrine pancreatic insufficiency: Secondary | ICD-10-CM

## 2024-11-22 DIAGNOSIS — K529 Noninfective gastroenteritis and colitis, unspecified: Secondary | ICD-10-CM

## 2024-11-22 DIAGNOSIS — K219 Gastro-esophageal reflux disease without esophagitis: Secondary | ICD-10-CM

## 2024-11-22 DIAGNOSIS — K580 Irritable bowel syndrome with diarrhea: Secondary | ICD-10-CM

## 2024-11-22 DIAGNOSIS — K52831 Collagenous colitis: Secondary | ICD-10-CM | POA: Diagnosis not present

## 2024-11-22 MED ORDER — PANTOPRAZOLE SODIUM 20 MG PO TBEC: 20.0000 mg | DELAYED_RELEASE_TABLET | Freq: Every day | ORAL | 1 refills | Status: AC

## 2024-11-22 NOTE — Patient Instructions (Signed)
 It was very nice to meet you today, as dicussed with will plan for the following :  1) protonix  20mg , 30 min before breakfast  2) continue CREON 

## 2024-11-22 NOTE — Progress Notes (Signed)
 Brooke Farrell Brooke Farrell , M.D. Gastroenterology & Hepatology [facility] [address] [place], [place] [address] Primary Care Physician: Bevely Doffing, FNP [address] [place] [address]  Chief Complaint:   pancreatic insufficiency and collagenous colitis  History of Present Illness: Brooke Farrell is a 69 y.o. 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  Patient has been taking Creon  as directed and completed budesonide  taper  Patient main issue today remains worsening acid reflux and GERD especially before bedtime.  Currently she is not on any antireflux medication  Previous history   Patient was last seen 08/2024 Patient underwent bidirectional endoscopy 06/2024 biopsies are positive for collagenous colitis.  Stool studies suggest pancreatic insufficiency Patient was previously on Vibreza , was given diagnosis of GERD and IBS-D.  Patient has seen multiple gastroenterologist previously  Fortunately patient reports her diarrhea has resolved after 15 years.  Previously she was having 6-8 bowel movements daily and  The patient denies having any nausea, vomiting, fever, chills, hematochezia, melena, hematemesis, abdominal pain,  jaundice, pruritus or weight loss.   Last EGD: 06/2024  - 4 cm hiatal hernia. - Gastritis. Biopsied. - Eroded and nodular mucosa in the gastric body. Biopsied. - Normal duodenal bulb and second portion of the duodenum. Biopsied.  A. SMALL BOWEL, BIOPSY:  - Small intestinal mucosa with no specific histopathologic changes  - Negative for increased intraepithelial lymphocytes or villous  architectural changes   B. STOMACH, BIOPSY:  - Gastric antral mucosa with no specific histopathologic changes  -  Helicobacter pylori-like organisms are not identified on routine HE  stain   C. STOMACH, ABNORMAL MUCOSA, BIOPSY:  - Gastric antral type mucosa with focal nonspecific erosion  - Negative for intestinal metaplasia, dysplasia or malignancy   D. COLON, RANDOM, BIOPSY:  - Collagenous colitis   Last Colonoscopy:   The perianal and digital rectal examinations were normal.  There is no endoscopic evidence of inflammation in the entire colon. Biopsies for histology were taken with a cold forceps from the entire colon for evaluation of microscopic colitis.  Non- bleeding internal hemorrhoids were found during retroflexion. The hemorrhoids were small.  2018 [employer] 1.  The colonoscopy was completed to the intended extent.  An ileocecal valve photo was captured An appendiceal orifice photo was captured A terminal ileum photo was captured.   2.  There was adequate preparation of the colon to follow recommended surveillance guidelines.   3.  Colonoscopy Polyp Specimen Collection: Biopsy of a non-polyp, mucosal abnormality performed.   Colon, random, endoscopic biopsy:   Colonic mucosa with no pathologic diagnosis. No active or chronic colitis is seen. No evidence of lymphocytic or collagenous colitis is seen.   FHx: neg for any gastrointestinal/liver disease, no malignancies Social: neg smoking, alcohol or illicit drug use  Past Medical History: Past Medical History:  Diagnosis Date   Acid reflux    Allergy     Anxiety    Asthma    Cataract    Depression    Diabetes mellitus without complication (HCC)    Encounter for general adult medical examination with abnormal findings 12/12/2022   Hyperlipidemia associated with type 2 diabetes mellitus (HCC) 08/30/2024   Hypertension    PONV (postoperative nausea and vomiting)    Sleep apnea  Past Surgical History: Past Surgical History:  Procedure Laterality Date   AUGMENTATION MAMMAPLASTY  1985   BREAST BIOPSY Right    2022/2023    CESAREAN SECTION     COLONOSCOPY N/A 06/07/2024   Procedure: COLONOSCOPY;  Surgeon: Cinderella Deatrice FALCON, MD;  [facility]: [facility];  Service: Endoscopy;  Laterality: N/A;  11:00am, asa 1-2   ESOPHAGOGASTRODUODENOSCOPY N/A 06/07/2024   Procedure: EGD (ESOPHAGOGASTRODUODENOSCOPY);  Surgeon: Cinderella Deatrice FALCON, MD;  Location: [facility];  Service: Endoscopy;  Laterality: N/A;   EYE SURGERY Bilateral 05/01/2022   eyelid surgery   JOINT REPLACEMENT     NO PAST SURGERIES     TOTAL HIP ARTHROPLASTY  01/2020   dr. hiram    Family History: Family History  Problem Relation Age of Onset   Breast cancer Mother 22 - 43   Kidney disease Mother    Varicose Veins Mother    Cancer Father        bone   Diabetes Father    Hypertension Father    Heart attack Father    Diverticulitis Sister    Allergic rhinitis Neg Hx    Angioedema Neg Hx    Asthma Neg Hx    Atopy Neg Hx    Eczema Neg Hx    Immunodeficiency Neg Hx    Urticaria Neg Hx    Sleep apnea Neg Hx     Social History: Social History   Tobacco Use  Smoking Status Never  Smokeless Tobacco Never   Social History   Substance and Sexual Activity  Alcohol Use Yes   Comment: two drinks a month   Social History   Substance and Sexual Activity  Drug Use No    Allergies: Allergies  Allergen Reactions   Clavulanic Acid Nausea Only and Other (See Comments)    clavulanic acid   Mixed Grasses Anaphylaxis and Shortness Of Breath   Other Swelling   Prednisone  Other (See Comments)    Patient reports having very bad mood swings and was very angry when on this drug   Robitussin (Alcohol Free)  [Guaifenesin ] Swelling   Sulfa Antibiotics Swelling   Amoxicillin Diarrhea    Medications: Current Outpatient Medications  Medication Sig Dispense Refill   albuterol  (VENTOLIN  HFA) 108 (90 Base) MCG/ACT inhaler Inhale 2 puffs into the lungs every 4 (four) hours as needed for wheezing or shortness of breath. 18 g 1   budesonide   (ENTOCORT EC ) 3 MG 24 hr capsule TAKE TWO TABLETS DAILY FOR 14 DAYS, THEN DECREASE TO 1 TABLET FOR 14 DAYS. 126 capsule 1   cetirizine  (ZYRTEC ) 10 MG tablet Take 1 tablet (10 mg total) by mouth daily as needed. 90 tablet 3   cycloSPORINE (RESTASIS) 0.05 % ophthalmic emulsion Place 1 drop into both eyes 2 (two) times daily.     Efinaconazole  10 % SOLN Apply 1 drop topically daily. 4 mL 11   EPINEPHrine  (EPIPEN  2-PAK) 0.3 mg/0.3 mL IJ SOAJ injection Inject 0.3 mg into the muscle as needed for anaphylaxis. 2 each 1   eszopiclone  (LUNESTA ) 1 MG TABS tablet Take 1 tablet (1 mg total) by mouth daily as needed. 30 tablet 2   fluticasone  (FLONASE ) 50 MCG/ACT nasal spray Place 2 sprays into both nostrils daily. (Patient taking differently: Place 2 sprays into both nostrils as needed.) 48 g 3   lidocaine  (LIDODERM ) 5 % Place 1 patch onto the skin daily. Remove & Discard patch within 12 hours or as directed by MD 30 patch 0  lipase/protease/amylase (CREON ) 36000 UNITS CPEP capsule Take 2 capsules (72,000 Units total) by mouth 3 (three) times daily with meals. May also take 1 capsule (36,000 Units total) as needed (with snacks - up to 4 snacks daily). 300 capsule 11   montelukast  (SINGULAIR ) 10 MG tablet Take 1 tablet (10 mg total) by mouth at bedtime. (Patient taking differently: Take 10 mg by mouth as needed.) 90 tablet 3   pantoprazole  (PROTONIX ) 40 MG tablet Take 1 tablet (40 mg total) by mouth daily. 90 tablet 1   sertraline  (ZOLOFT ) 100 MG tablet Take 1 tablet (100 mg total) by mouth daily. 90 tablet 1   tirzepatide  (MOUNJARO ) 2.5 MG/0.5ML Pen Inject 2.5 mg into the skin once a week. 2 mL 2   tiZANidine  (ZANAFLEX ) 4 MG tablet Take 1 tablet (4 mg total) by mouth every 8 (eight) hours as needed for muscle spasms. Do not drink alcohol or drive while taking this medication.  May cause drowsiness. 15 tablet 0   triamcinolone  (KENALOG ) 0.025 % ointment Apply 1 Application topically 2 (two) times daily. 30 g 5    No current facility-administered medications for this visit.    Review of Systems: GENERAL: negative for malaise, night sweats HEENT: No changes in hearing or vision, no nose bleeds or other nasal problems. NECK: Negative for lumps, goiter, pain and significant neck swelling RESPIRATORY: Negative for cough, wheezing CARDIOVASCULAR: Negative for chest pain, leg swelling, palpitations, orthopnea GI: SEE HPI MUSCULOSKELETAL: Negative for joint pain or swelling, back pain, and muscle pain. SKIN: Negative for lesions, rash HEMATOLOGY Negative for prolonged bleeding, bruising easily, and swollen nodes. ENDOCRINE: Negative for cold or heat intolerance, polyuria, polydipsia and goiter. NEURO: negative for tremor, gait imbalance, syncope and seizures. The remainder of the review of systems is noncontributory.   Physical Exam: BP 115/75   Pulse 73   Temp (!) 97.2 F (36.2 C)   Ht 5' 8 (1.727 m)   Wt 202 lb 1.6 oz (91.7 kg)   BMI 30.73 kg/m  GENERAL: The patient is AO x3, in no acute distress. HEENT: Head is normocephalic and atraumatic. EOMI are intact. Mouth is well hydrated and without lesions. NECK: Supple. No masses LUNGS: Clear to auscultation. No presence of rhonchi/wheezing/rales. Adequate chest expansion HEART: RRR, normal s1 and s2. ABDOMEN: Soft, nontender, no guarding, no peritoneal signs, and nondistended. BS +. No masses.   Imaging/Labs: as above     Latest Ref Rng & Units 03/18/2023    8:46 AM  CBC  WBC 3.4 - 10.8 x10E3/uL 5.7   Hemoglobin 11.1 - 15.9 g/dL 86.4   Hematocrit 65.9 - 46.6 % 40.8   Platelets 150 - 450 x10E3/uL 254    No results found for: IRON, TIBC, FERRITIN  I personally reviewed and interpreted the available labs, imaging and endoscopic files.    Pancreatic elastase 44 suggesting Severe Pancreatic Insufficiency: <100  CRP 6 Negative celiac panel Negative alpha gal        2018    IMPRESSION:  1. No acute abnormalities.  2.   Hepatic steatosis. Stable liver cyst.  3.   Hiatal hernia.  4.  Cholelithiasis without complications.   Impression and Plan: Brooke Farrell is a 69 y.o. 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  Stool studies suggest  pancreatic insufficiency Patient diarrhea is completely resolved. Patient has been Creon  as directed Completed budesonide  taper  Plan  -Patient was previously told to stop Protonix  and switch it to H2 receptor blocker but her symptoms of GERD has worsened  -avoid  offending agents( SSRI, NSAIDs) .  - Given new studies demonstrating the medication trigger might have been a reported and the risk of medication induced microscopic colitis is substantially low.  With shared decision making we will restart patient on PPI lowest effective dose, with diarrhea worsens then may stop PPI  and will give another taper of budesonide   #Exocrine pancreatic insufficiency   Pancreatic elastase 44 suggesting Severe Pancreatic     C/w  Creon    All questions were answered.      Pualani Borah Brooke Jary Louvier, MD Gastroenterology and Hepatology [facility]astroenter[facility]   This chart has been completed using [facility] Dictation software, and while attempts have been made to ensure accuracy , certain words and phrases may not be transcribed as intended

## 2024-12-01 ENCOUNTER — Other Ambulatory Visit: Payer: Self-pay

## 2024-12-01 DIAGNOSIS — E119 Type 2 diabetes mellitus without complications: Secondary | ICD-10-CM

## 2025-02-25 ENCOUNTER — Ambulatory Visit: Admitting: Allergy & Immunology

## 2025-02-28 ENCOUNTER — Ambulatory Visit

## 2025-03-01 ENCOUNTER — Ambulatory Visit

## 2025-04-26 ENCOUNTER — Encounter: Admitting: Adult Health

## 2025-05-03 ENCOUNTER — Telehealth: Admitting: Adult Health
